# Patient Record
Sex: Female | Born: 1970 | Race: Black or African American | Hispanic: No | Marital: Married | Smoking: Never smoker
Health system: Southern US, Community
[De-identification: ages and names within clinical notes are randomized; demographics above are authoritative.]

## PROBLEM LIST (undated history)

## (undated) DIAGNOSIS — M5430 Sciatica, unspecified side: Secondary | ICD-10-CM

## (undated) DIAGNOSIS — IMO0002 Reserved for concepts with insufficient information to code with codable children: Secondary | ICD-10-CM

## (undated) DIAGNOSIS — I10 Essential (primary) hypertension: Secondary | ICD-10-CM

## (undated) DIAGNOSIS — R7989 Other specified abnormal findings of blood chemistry: Secondary | ICD-10-CM

## (undated) DIAGNOSIS — M2550 Pain in unspecified joint: Secondary | ICD-10-CM

## (undated) DIAGNOSIS — R112 Nausea with vomiting, unspecified: Secondary | ICD-10-CM

## (undated) DIAGNOSIS — D649 Anemia, unspecified: Secondary | ICD-10-CM

## (undated) DIAGNOSIS — M1990 Unspecified osteoarthritis, unspecified site: Secondary | ICD-10-CM

## (undated) DIAGNOSIS — R232 Flushing: Secondary | ICD-10-CM

## (undated) DIAGNOSIS — H539 Unspecified visual disturbance: Secondary | ICD-10-CM

## (undated) DIAGNOSIS — J302 Other seasonal allergic rhinitis: Secondary | ICD-10-CM

## (undated) DIAGNOSIS — M545 Low back pain, unspecified: Secondary | ICD-10-CM

## (undated) HISTORY — DX: Reserved for concepts with insufficient information to code with codable children: IMO0002

## (undated) HISTORY — PX: OTHER SURGICAL HISTORY: SHX169

## (undated) HISTORY — DX: Other specified abnormal findings of blood chemistry: R79.89

## (undated) HISTORY — PX: LAPAROSCOPY, DIAGNOSTIC: SHX4584

## (undated) HISTORY — DX: Low back pain, unspecified: M54.50

## (undated) HISTORY — PX: ENDOMETRIAL ABLATION: SHX621

## (undated) HISTORY — DX: Unspecified osteoarthritis, unspecified site: M19.90

## (undated) HISTORY — DX: Sciatica, unspecified side: M54.30

## (undated) HISTORY — DX: Other seasonal allergic rhinitis: J30.2

## (undated) HISTORY — DX: Flushing: R23.2

## (undated) HISTORY — DX: Pain in unspecified joint: M25.50

## (undated) HISTORY — PX: EXCISION BIOPSY WITH NEEDLE LOCALIZATION: SHX2709

## (undated) HISTORY — DX: Essential (primary) hypertension: I10

---

## 2007-08-03 ENCOUNTER — Other Ambulatory Visit: Admission: RE | Admit: 2007-08-03 | Discharge: 2007-08-03 | Payer: Self-pay | Admitting: Family Medicine

## 2007-09-21 ENCOUNTER — Encounter: Admission: RE | Admit: 2007-09-21 | Discharge: 2007-09-21 | Payer: Self-pay | Admitting: Family Medicine

## 2008-07-29 ENCOUNTER — Inpatient Hospital Stay (HOSPITAL_COMMUNITY): Admission: EM | Admit: 2008-07-29 | Discharge: 2008-08-02 | Payer: Self-pay | Admitting: Emergency Medicine

## 2008-12-12 ENCOUNTER — Encounter: Admission: RE | Admit: 2008-12-12 | Discharge: 2008-12-12 | Payer: Self-pay | Admitting: Obstetrics and Gynecology

## 2009-01-12 ENCOUNTER — Encounter: Admission: RE | Admit: 2009-01-12 | Discharge: 2009-01-12 | Payer: Self-pay | Admitting: Obstetrics and Gynecology

## 2010-01-14 ENCOUNTER — Encounter: Admission: RE | Admit: 2010-01-14 | Discharge: 2010-01-14 | Payer: Self-pay | Admitting: Internal Medicine

## 2010-04-20 ENCOUNTER — Encounter: Admission: RE | Admit: 2010-04-20 | Discharge: 2010-04-20 | Payer: Self-pay | Admitting: Neurosurgery

## 2010-08-18 ENCOUNTER — Encounter: Payer: Self-pay | Admitting: Family Medicine

## 2010-11-11 LAB — POCT I-STAT, CHEM 8
Chloride: 103 mEq/L (ref 96–112)
Glucose, Bld: 344 mg/dL — ABNORMAL HIGH (ref 70–99)
HCT: 42 % (ref 36.0–46.0)

## 2010-11-11 LAB — DIFFERENTIAL
Eosinophils Relative: 1 % (ref 0–5)
Lymphocytes Relative: 10 % — ABNORMAL LOW (ref 12–46)
Lymphocytes Relative: 22 % (ref 12–46)
Lymphocytes Relative: 5 % — ABNORMAL LOW (ref 12–46)
Lymphs Abs: 1.6 10*3/uL (ref 0.7–4.0)
Lymphs Abs: 1.6 10*3/uL (ref 0.7–4.0)
Lymphs Abs: 1.8 10*3/uL (ref 0.7–4.0)
Monocytes Absolute: 0.7 10*3/uL (ref 0.1–1.0)
Monocytes Absolute: 0.8 10*3/uL (ref 0.1–1.0)
Monocytes Absolute: 1.6 10*3/uL — ABNORMAL HIGH (ref 0.1–1.0)
Monocytes Relative: 4 % (ref 3–12)
Monocytes Relative: 7 % (ref 3–12)
Monocytes Relative: 7 % (ref 3–12)
Neutro Abs: 29.6 10*3/uL — ABNORMAL HIGH (ref 1.7–7.7)
Neutro Abs: 6.2 10*3/uL (ref 1.7–7.7)
Neutro Abs: 7.6 10*3/uL (ref 1.7–7.7)
Neutrophils Relative %: 71 % (ref 43–77)
Neutrophils Relative %: 90 % — ABNORMAL HIGH (ref 43–77)

## 2010-11-11 LAB — CBC
HCT: 31.4 % — ABNORMAL LOW (ref 36.0–46.0)
HCT: 40.4 % (ref 36.0–46.0)
Hemoglobin: 10 g/dL — ABNORMAL LOW (ref 12.0–15.0)
Hemoglobin: 9.3 g/dL — ABNORMAL LOW (ref 12.0–15.0)
MCHC: 31.9 g/dL (ref 30.0–36.0)
MCV: 81.9 fL (ref 78.0–100.0)
Platelets: 264 10*3/uL (ref 150–400)
RBC: 3.3 MIL/uL — ABNORMAL LOW (ref 3.87–5.11)
RBC: 3.52 MIL/uL — ABNORMAL LOW (ref 3.87–5.11)
RBC: 3.83 MIL/uL — ABNORMAL LOW (ref 3.87–5.11)
RBC: 4.93 MIL/uL (ref 3.87–5.11)
RDW: 15.8 % — ABNORMAL HIGH (ref 11.5–15.5)
WBC: 17 10*3/uL — ABNORMAL HIGH (ref 4.0–10.5)
WBC: 32.8 10*3/uL — ABNORMAL HIGH (ref 4.0–10.5)
WBC: 8.7 10*3/uL (ref 4.0–10.5)

## 2010-11-11 LAB — POCT CARDIAC MARKERS
CKMB, poc: <1 ng/mL — ABNORMAL LOW (ref 1.0–8.0)
Myoglobin, poc: 53.4 ng/mL (ref 12–200)
Troponin i, poc: <0.05 ng/mL (ref 0.00–0.09)

## 2010-11-11 LAB — URINALYSIS, ROUTINE W REFLEX MICROSCOPIC
Glucose, UA: >1000 mg/dL — AB
Hgb urine dipstick: NEGATIVE
Ketones, ur: NEGATIVE mg/dL
Leukocytes, UA: NEGATIVE
Protein, ur: 30 mg/dL — AB
Specific Gravity, Urine: 1.027 (ref 1.005–1.030)
Urobilinogen, UA: 1 mg/dL (ref 0.0–1.0)

## 2010-11-11 LAB — BASIC METABOLIC PANEL
CO2: 25 mEq/L (ref 19–32)
Calcium: 7.8 mg/dL — ABNORMAL LOW (ref 8.4–10.5)
Chloride: 100 mEq/L (ref 96–112)
GFR calc Af Amer: >60 mL/min (ref >60)
GFR calc non Af Amer: >60 mL/min (ref >60)
GFR calc non Af Amer: >60 mL/min (ref >60)
Potassium: 3.8 mEq/L (ref 3.5–5.1)
Potassium: 3.9 mEq/L (ref 3.5–5.1)
Sodium: 131 mEq/L — ABNORMAL LOW (ref 135–145)
Sodium: 138 mEq/L (ref 135–145)

## 2010-11-11 LAB — GLUCOSE, CAPILLARY
Glucose-Capillary: 174 mg/dL — ABNORMAL HIGH (ref 70–99)
Glucose-Capillary: 174 mg/dL — ABNORMAL HIGH (ref 70–99)
Glucose-Capillary: 186 mg/dL — ABNORMAL HIGH (ref 70–99)
Glucose-Capillary: 194 mg/dL — ABNORMAL HIGH (ref 70–99)
Glucose-Capillary: 208 mg/dL — ABNORMAL HIGH (ref 70–99)
Glucose-Capillary: 214 mg/dL — ABNORMAL HIGH (ref 70–99)
Glucose-Capillary: 218 mg/dL — ABNORMAL HIGH (ref 70–99)
Glucose-Capillary: 226 mg/dL — ABNORMAL HIGH (ref 70–99)
Glucose-Capillary: 240 mg/dL — ABNORMAL HIGH (ref 70–99)
Glucose-Capillary: 270 mg/dL — ABNORMAL HIGH (ref 70–99)
Glucose-Capillary: 278 mg/dL — ABNORMAL HIGH (ref 70–99)

## 2010-11-11 LAB — CULTURE, BLOOD (ROUTINE X 2): Culture: NO GROWTH

## 2010-11-11 LAB — COMPREHENSIVE METABOLIC PANEL
AST: 14 U/L (ref 0–37)
Albumin: 3.2 g/dL — ABNORMAL LOW (ref 3.5–5.2)
Alkaline Phosphatase: 80 U/L (ref 39–117)
CO2: 26 mEq/L (ref 19–32)
Glucose, Bld: 233 mg/dL — ABNORMAL HIGH (ref 70–99)
Potassium: 3.9 mEq/L (ref 3.5–5.1)

## 2010-11-11 LAB — CHOLESTEROL, TOTAL: Cholesterol: 122 mg/dL (ref 0–200)

## 2010-11-11 LAB — HEMOGLOBIN A1C: Hgb A1c MFr Bld: 9.7 % — ABNORMAL HIGH (ref 4.6–6.1)

## 2010-11-11 LAB — TSH: TSH: 1.125 u[IU]/mL (ref 0.350–4.500)

## 2010-11-11 LAB — URINE CULTURE: Special Requests: NEGATIVE

## 2010-12-06 ENCOUNTER — Emergency Department: Admit: 2010-12-06 | Disposition: A | Discharge status: Home / Self Care | Payer: Self-pay | Source: Emergency Department

## 2010-12-06 LAB — URINALYSIS, REFLEX TO MICROSCOPIC EXAM IF INDICATED
Bilirubin, UA: NEGATIVE
Blood, UA: NEGATIVE
Glucose, UA: NEGATIVE
Ketones UA: NEGATIVE
Leukocyte Esterase, UA: NEGATIVE
Nitrite, UA: NEGATIVE
Protein, UR: NEGATIVE
Specific Gravity UA POCT: 1.01 (ref 1.001–1.035)
Urine pH: 7.5 (ref 5.0–8.0)
Urobilinogen, UA: 0.2 mg/dL

## 2010-12-06 LAB — URINE HCG QUALITATIVE: Urine HCG Qualitative: NEGATIVE

## 2010-12-10 NOTE — Discharge Summary (Signed)
[name], [name] NO.:  [id number]   MEDICAL RECORD NO.:  [id number]          PATIENT TYPE:  INP   LOCATION:  5508                         FACILITY:  [facility]   PHYSICIAN:  Altha Harm, MDDATE OF BIRTH:  [birth year]   DATE OF ADMISSION:  07/29/2008  DATE OF DISCHARGE:  08/02/2008                               DISCHARGE SUMMARY   DISCHARGE DISPOSITION:  Home.   FINAL DISCHARGE DIAGNOSES:  1. Community-acquired pneumonia.  2. Sepsis, resolved.  3. Diabetes type 2, uncontrolled.  4. Hypertension.  5. Hyperlipidemia.  6. Iron-deficiency anemia.  7. Gastroesophageal reflux disease.   DISCHARGE MEDICATIONS:  1. Metformin 850 mg p.o. b.i.d.  2. Avelox 4 mg p.o. daily times 5 days.  3. Lisinopril 10 mg p.o. daily.  4. Pravastatin 10 mg p.o. daily.  5. Iron 325 mg p.o. b.i.d.  6. Prilosec 20 mg p.o. daily.  7. Multivitamin 1 tablet p.o. daily.  8. Vitamin C 1000 mg p.o. b.i.d.  9. Fish oil 4 tablets p.o. daily.   CONSULTANTS:  None.   PROCEDURES:  None.   DIAGNOSTIC STUDIES:  1. Chest x-ray, two-view done on admission which shows findings      consistent with atelectasis bibasilar.  2. CT abdomen with contrast done on January 2 which showed right      middle and lower lobe air space disease consistent with infection,      probable gallstones without acute cholecystitis.   PERTINENT LABORATORY STUDIES:  Hemoglobin A1c elevated at 9.7.   CHIEF COMPLAINT:  Shortness of breath.   HISTORY OF PRESENT ILLNESS:  Please refer to the H and P dictated by  Della Goo, M.D. for details of the HPI.   HOSPITAL COURSE:  The patient was admitted and initially started  azithromycin and Rocephin.  The patient continued to deteriorate and  became septic.  Her antibiotics were changed to Zosyn and vancomycin and  she was given aggressive fluid resuscitation.  The patient over her  hospital course improved and resolved her hypoxia.  The patient's  antibiotics were changed from vancomycin and Zosyn to Avelox 400 mg p.o.  daily and the patient will complete an additional 5 days after discharge  for a total of 10 days of antibiotics.   Diabetes type 2.  The patient was found to have a hemoglobin A1c of 9.5.  The patient's blood sugars were elevated during the hospitalization and  she was taken off of her metformin in light of her contrast study of a  CT.  The patient, prehospital was on the lowest dose of metformin and  certainly has significant room for further oral medications.  The  patient's metformin is being increased to 850 mg p.o. b.i.d. and she  will follow up with her primary care physician Della Goo, M.D.  for further titration of her medications.  The patient at this time does  not want to have to take insulin and I agreed that she can likely get  her blood sugars under better control without the use of insulin at this  time.  Otherwise, the patient has remained stable.  Her other medical  problems were noted and are stable.   FOLLOW UP:  The patient is to follow up with her primary care physician  Della Goo, M.D. in 3-5 days.   DIETARY RESTRICTIONS:  The patient should be on a diabetic low-  cholesterol diet.   PHYSICAL RESTRICTIONS:  Activity as tolerated.      Altha Harm, MD  Electronically Signed     MAM/MEDQ  D:  08/01/2008  T:  08/01/2008  Job:  [id number]   cc:   Della Goo, M.D.

## 2010-12-10 NOTE — H&P (Signed)
NAMEGROVER, WOODFIELD NO.:  [id number]   MEDICAL RECORD NO.:  [id number]          PATIENT TYPE:  INP   LOCATION:  5025                         FACILITY:  [facility]   PHYSICIAN:  Della Goo, M.D. DATE OF BIRTH:  [birth year]   DATE OF ADMISSION:  07/29/2008  DATE OF DISCHARGE:                              HISTORY & PHYSICAL   PRIMARY CARE PHYSICIAN:  None.   CHIEF COMPLAINT:  Shortness of breath.   HISTORY OF PRESENT ILLNESS:  This is a 40 year old female who presents  to the emergency department with complaints of worsening shortness of  breath and back pain along with cough over the past 2-3 days.  She  reports having back pain associated with her coughing and reports having  pain which she takes deep breaths.  She denies having fevers and chills.  However, was found to have a temperature of 101.0 in the emergency  department.  She denies having any myalgias or arthralgias or night  sweats.  She also denies having any nausea, vomiting, diarrhea.   PAST MEDICAL HISTORY:  Significant for:  1. Type 2 diabetes mellitus.  2. Hyperlipidemia.  3. Hypertension.   PAST SURGICAL HISTORY:  History of a C-section x1.   Her medications include:  1. Metformin 500 mg 1 p.o. b.i.d.  2. Lisinopril 10 mg 1 p.o. daily.  3. Pravastatin 10 mg 1 p.o. daily   ALLERGIES:  No known drug allergies.   SOCIAL HISTORY:  The patient is a nonsmoker, nondrinker.   FAMILY HISTORY:  Positive for diabetic and hypertensive disease.   REVIEW OF SYSTEMS:  Mentioned above in the history of present illness.   PHYSICAL EXAMINATION FINDINGS:  This is a 40 year old, obese, well-  developed, female in no discomfort or acute distress.  VITAL SIGNS:  Temperature 101.0, blood pressure 131/86, heart rate 143  initially, respirations 26, O2 saturations 90% initially - now 96%.  HEENT EXAMINATION:  Normocephalic, atraumatic.  Pupils equally round,  reactive to light.  Extraocular movements are  intact.  Funduscopic  benign.  There is no scleral icterus.  Conjunctivae are pink.  There is  no conjunctival injection.  Nares are patent.  Oropharynx is clear.  No  exudates or erythema.  NECK:  Is supple full range of motion.  No thyromegaly, adenopathy, or  jugular venous distention.  CARDIOVASCULAR:  Regular rate and rhythm.  No murmurs, gallops or rubs.  LUNGS:  With occasional rhonchi.  No rales, no wheezes.  ABDOMEN:  Positive bowel sounds, soft, nontender, nondistended.  EXTREMITIES:  Without cyanosis, clubbing or edema.  NEUROLOGIC EXAMINATION:  Nonfocal.   LABORATORY STUDIES:  White blood cell count 32.8, hemoglobin 12.8,  hematocrit 40.4, platelets 164, neutrophils 90%, lymphocytes 5%, MCV  81.9.  Sodium 133, potassium 4.4, chloride 103, bicarb 21, BUN 7,  creatinine 0.8, and glucose 344.  D-dimer less than 0.22.  Point of care  cardiac markers with a myoglobin of 53.4, CK-MB less than 1.0, troponin  less than 0.05.  Chest x-ray reveals decreased lung volumes with  bibasilar atelectasis.   ASSESSMENT:  A 40 year old female being admitted  with:  1. Sepsis.  2. Pneumonia.  3. Type 2 diabetes mellitus with hyperglycemia.  4. Hypertension.  5. Shortness of breath.  6. Mild hyponatremia.   PLAN:  The patient will be admitted and placed on antibiotic therapy of  Rocephin and azithromycin.  Nebulizer treatments have been ordered along  with fluid resuscitation.  The patient will also be placed on empiric  influenza medication and respiratory droplet precautions.  Medications  have been ordered for coughing and congestion symptoms as well.  The  patient's electrolytes will be corrected as needed.  DVT and GI  prophylaxis have been ordered.  Sliding scale insulin coverage has also  been ordered p.r.n. elevated blood sugars.  Further workup will ensue  pending results of the patient's clinical course and results of her  studies.      Della Goo, M.D.   Electronically Signed     HJ/MEDQ  D:  07/29/2008  T:  07/29/2008  Job:  [id number]

## 2011-01-16 ENCOUNTER — Other Ambulatory Visit: Payer: Self-pay | Admitting: Internal Medicine

## 2011-01-16 DIAGNOSIS — Z1231 Encounter for screening mammogram for malignant neoplasm of breast: Secondary | ICD-10-CM

## 2011-01-23 ENCOUNTER — Ambulatory Visit
Admission: RE | Admit: 2011-01-23 | Discharge: 2011-01-23 | Disposition: A | Discharge status: Home / Self Care | Payer: Medicaid Other | Source: Ambulatory Visit | Attending: Internal Medicine | Admitting: Internal Medicine

## 2011-01-23 DIAGNOSIS — Z1231 Encounter for screening mammogram for malignant neoplasm of breast: Secondary | ICD-10-CM

## 2011-02-11 ENCOUNTER — Other Ambulatory Visit (HOSPITAL_COMMUNITY)
Admission: RE | Admit: 2011-02-11 | Discharge: 2011-02-11 | Disposition: A | Discharge status: Home / Self Care | Payer: Medicaid Other | Source: Ambulatory Visit | Attending: Obstetrics and Gynecology | Admitting: Obstetrics and Gynecology

## 2011-02-11 ENCOUNTER — Other Ambulatory Visit: Payer: Self-pay

## 2011-02-11 DIAGNOSIS — Z01419 Encounter for gynecological examination (general) (routine) without abnormal findings: Secondary | ICD-10-CM | POA: Insufficient documentation

## 2011-02-11 DIAGNOSIS — Z1159 Encounter for screening for other viral diseases: Secondary | ICD-10-CM | POA: Insufficient documentation

## 2011-08-04 ENCOUNTER — Emergency Department (INDEPENDENT_AMBULATORY_CARE_PROVIDER_SITE_OTHER)
Admission: EM | Admit: 2011-08-04 | Discharge: 2011-08-04 | Disposition: A | Discharge status: Home / Self Care | Payer: Self-pay | Source: Home / Self Care | Attending: Family Medicine | Admitting: Family Medicine

## 2011-08-04 ENCOUNTER — Encounter (HOSPITAL_COMMUNITY): Payer: Self-pay | Admitting: *Deleted

## 2011-08-04 DIAGNOSIS — K5289 Other specified noninfective gastroenteritis and colitis: Secondary | ICD-10-CM

## 2011-08-04 DIAGNOSIS — E119 Type 2 diabetes mellitus without complications: Secondary | ICD-10-CM

## 2011-08-04 DIAGNOSIS — K529 Noninfective gastroenteritis and colitis, unspecified: Secondary | ICD-10-CM

## 2011-08-04 HISTORY — DX: Sciatica, unspecified side: M54.30

## 2011-08-04 HISTORY — DX: Essential (primary) hypertension: I10

## 2011-08-04 LAB — POCT I-STAT, CHEM 8
BUN: 15 mg/dL (ref 6–23)
Calcium, Ion: 1.12 mmol/L (ref 1.12–1.32)
Chloride: 103 mEq/L (ref 96–112)
Creatinine, Ser: 0.8 mg/dL (ref 0.50–1.10)
Glucose, Bld: 237 mg/dL — ABNORMAL HIGH (ref 70–99)
HCT: 41 % (ref 36.0–46.0)
Hemoglobin: 13.9 g/dL (ref 12.0–15.0)
Potassium: 4.1 mEq/L (ref 3.5–5.1)
Sodium: 133 mEq/L — ABNORMAL LOW (ref 135–145)
TCO2: 22 mmol/L (ref 0–100)

## 2011-08-04 LAB — GLUCOSE, CAPILLARY: Glucose-Capillary: 227 mg/dL — ABNORMAL HIGH (ref 70–99)

## 2011-08-04 MED ORDER — ONDANSETRON HCL 4 MG/2ML IJ SOLN
4.0000 mg | Freq: Once | INTRAMUSCULAR | Status: AC
  Administered 2011-08-04: 4 mg via INTRAMUSCULAR

## 2011-08-04 MED ORDER — ACETAMINOPHEN 325 MG PO TABS
ORAL_TABLET | ORAL | Status: AC
  Filled 2011-08-04: qty 2

## 2011-08-04 MED ORDER — ONDANSETRON HCL 4 MG/2ML IJ SOLN
INTRAMUSCULAR | Status: AC
  Filled 2011-08-04: qty 2

## 2011-08-04 MED ORDER — ONDANSETRON HCL 4 MG PO TABS: 4.0000 mg | ORAL_TABLET | Freq: Four times a day (QID) | ORAL | Status: AC

## 2011-08-04 NOTE — ED Provider Notes (Signed)
History     CSN: [id number]  Arrival date & time 08/04/11  1509   First MD Initiated Contact with Patient 08/04/11 1623      Chief Complaint  Patient presents with  . Emesis    (Consider location/radiation/quality/duration/timing/severity/associated sxs/prior treatment) Patient is a 41 y.o. female presenting with vomiting. The history is provided by the patient.  Emesis  This is a new problem. The current episode started yesterday. The problem occurs 2 to 4 times per day. The problem has not changed since onset.The emesis has an appearance of stomach contents. The maximum temperature recorded prior to her arrival was 101 to 101.9 F. The fever has been present for less than 1 day. Associated symptoms include diarrhea and a fever.    Past Medical History  Diagnosis Date  . Diabetes mellitus   . Sciatica   . Hypertension     Past Surgical History  Procedure Date  . Cesarean section     History reviewed. No pertinent family history.  History  Substance Use Topics  . Smoking status: Current Everyday Smoker  . Smokeless tobacco: Not on file  . Alcohol Use: No    OB History    Grav Para Term Preterm Abortions TAB SAB Ect Mult Living                  Review of Systems  Constitutional: Positive for fever.  HENT: Negative.   Eyes: Negative.   Respiratory: Negative.   Cardiovascular: Negative.   Gastrointestinal: Positive for nausea, vomiting and diarrhea. Negative for blood in stool.  Skin: Negative.     Allergies  Review of patient's allergies indicates no known allergies.  Home Medications   Current Outpatient Rx  Name Route Sig Dispense Refill  . GABAPENTIN 300 MG PO CAPS Oral Take 300 mg by mouth 3 (three) times daily.      Marland Kitchen HYDROCODONE-ACETAMINOPHEN 10-650 MG PO TABS Oral Take 1 tablet by mouth every 6 (six) hours as needed.      . INSULIN GLARGINE 100 UNIT/ML Coker SOLN Subcutaneous Inject 25 Units into the skin at bedtime.      Marland Kitchen LISINOPRIL 10 MG PO TABS  Oral Take 10 mg by mouth daily.      Marland Kitchen METFORMIN HCL 500 MG PO TABS Oral Take 500 mg by mouth 2 (two) times daily with a meal.      . PRAVASTATIN SODIUM 10 MG PO TABS Oral Take 10 mg by mouth daily.      Marland Kitchen ONDANSETRON HCL 4 MG PO TABS Oral Take 1 tablet (4 mg total) by mouth every 6 (six) hours. 8 tablet 0    BP 137/78  Pulse 160  Temp(Src) 102.5 F (39.2 C) (Oral)  Resp 18  SpO2 96%  LMP 07/17/2011  Physical Exam  Nursing note and vitals reviewed. Constitutional: She is oriented to person, place, and time. She appears well-developed and well-nourished.  HENT:  Head: Normocephalic.  Mouth/Throat: Oropharynx is clear and moist.  Neck: Normal range of motion. Neck supple.  Cardiovascular: Regular rhythm, normal heart sounds and intact distal pulses.  Tachycardia present.   Pulmonary/Chest: Effort normal and breath sounds normal.  Abdominal: Soft. Bowel sounds are normal. She exhibits no distension. There is no tenderness. There is no rebound and no guarding.  Lymphadenopathy:    She has no cervical adenopathy.  Neurological: She is alert and oriented to person, place, and time.  Skin: Skin is warm and dry.  Psychiatric: She has a normal  mood and affect.    ED Course  Procedures (including critical care time)  Labs Reviewed  GLUCOSE, CAPILLARY - Abnormal; Notable for the following:    Glucose-Capillary 227 (*)    All other components within normal limits  POCT I-STAT, CHEM 8 - Abnormal; Notable for the following:    Sodium 133 (*)    Glucose, Bld 237 (*)    All other components within normal limits  I-STAT, CHEM 8   No results found.   1. Gastroenteritis   2. Diabetes mellitus       MDM          Barkley Bruns, MD 08/04/11 [phone]

## 2011-08-04 NOTE — ED Notes (Signed)
Pt  Has  vomitited  At  Least  12  Times   Over  Last  2  Days          Has  Had  3  Loose  Stools

## 2011-08-04 NOTE — ED Notes (Signed)
Pt  Is   Diabetic  She  Has  Fever  And  Vomiting  soince  yest  Did  Not  Take  Any  meds    Today  Went  To  Work  And  Had  To  Leave   She  Also  Reports   Pain  r  Leg

## 2012-01-12 ENCOUNTER — Other Ambulatory Visit: Payer: Self-pay | Admitting: Internal Medicine

## 2012-01-12 DIAGNOSIS — Z1231 Encounter for screening mammogram for malignant neoplasm of breast: Secondary | ICD-10-CM

## 2012-03-02 ENCOUNTER — Ambulatory Visit: Payer: Self-pay

## 2012-12-03 ENCOUNTER — Other Ambulatory Visit: Payer: Self-pay | Admitting: Internal Medicine

## 2012-12-03 ENCOUNTER — Ambulatory Visit
Admission: RE | Admit: 2012-12-03 | Discharge: 2012-12-03 | Disposition: A | Discharge status: Home / Self Care | Payer: BC Managed Care – PPO | Source: Ambulatory Visit | Attending: Internal Medicine | Admitting: Internal Medicine

## 2012-12-03 DIAGNOSIS — R52 Pain, unspecified: Secondary | ICD-10-CM

## 2012-12-03 DIAGNOSIS — M549 Dorsalgia, unspecified: Secondary | ICD-10-CM

## 2013-01-13 ENCOUNTER — Other Ambulatory Visit: Payer: Self-pay | Admitting: Neurosurgery

## 2013-01-13 DIAGNOSIS — M47816 Spondylosis without myelopathy or radiculopathy, lumbar region: Secondary | ICD-10-CM

## 2013-01-18 ENCOUNTER — Ambulatory Visit
Admission: RE | Admit: 2013-01-18 | Discharge: 2013-01-18 | Disposition: A | Discharge status: Home / Self Care | Payer: BC Managed Care – PPO | Source: Ambulatory Visit | Attending: Neurosurgery | Admitting: Neurosurgery

## 2013-01-18 DIAGNOSIS — M47816 Spondylosis without myelopathy or radiculopathy, lumbar region: Secondary | ICD-10-CM

## 2013-11-21 ENCOUNTER — Ambulatory Visit: Payer: BC Managed Care – PPO | Admitting: Endocrinology

## 2013-11-24 ENCOUNTER — Ambulatory Visit (INDEPENDENT_AMBULATORY_CARE_PROVIDER_SITE_OTHER): Payer: BC Managed Care – PPO | Admitting: Endocrinology

## 2013-11-24 ENCOUNTER — Encounter: Payer: Self-pay | Admitting: Endocrinology

## 2013-11-24 VITALS — BP 126/76 | HR 108 | Temp 98.1°F | Resp 12 | Ht 64.0 in | Wt 194.0 lb

## 2013-11-24 DIAGNOSIS — I10 Essential (primary) hypertension: Secondary | ICD-10-CM

## 2013-11-24 DIAGNOSIS — E1165 Type 2 diabetes mellitus with hyperglycemia: Principal | ICD-10-CM

## 2013-11-24 DIAGNOSIS — IMO0001 Reserved for inherently not codable concepts without codable children: Secondary | ICD-10-CM

## 2013-11-24 DIAGNOSIS — Z794 Long term (current) use of insulin: Secondary | ICD-10-CM | POA: Insufficient documentation

## 2013-11-24 DIAGNOSIS — E119 Type 2 diabetes mellitus without complications: Secondary | ICD-10-CM

## 2013-11-24 DIAGNOSIS — E11319 Type 2 diabetes mellitus with unspecified diabetic retinopathy without macular edema: Secondary | ICD-10-CM | POA: Insufficient documentation

## 2013-11-24 DIAGNOSIS — E7800 Pure hypercholesterolemia, unspecified: Secondary | ICD-10-CM

## 2013-11-24 LAB — GLUCOSE, POCT (MANUAL RESULT ENTRY): POC Glucose: 294 mg/dl — AB (ref 70–99)

## 2013-11-24 MED ORDER — METFORMIN HCL 500 MG PO TABS: 1000.0000 mg | ORAL_TABLET | Freq: Two times a day (BID) | ORAL | Status: DC

## 2013-11-24 MED ORDER — CANAGLIFLOZIN 100 MG PO TABS: 100.0000 mg | ORAL_TABLET | Freq: Every day | ORAL | Status: DC

## 2013-11-24 MED ORDER — VICTOZA 18 MG/3ML ~~LOC~~ SOPN
1.2000 mg | PEN_INJECTOR | Freq: Every day | SUBCUTANEOUS | Status: DC
Start: 2013-11-24 — End: 2014-04-27

## 2013-11-24 NOTE — Progress Notes (Signed)
Patient ID: [name] Hurley, female   DOB: 1971-07-22, 43 y.o.   MRN: [id number]    Reason for Appointment: Consultation for Type 2 Diabetes  History of Present Illness:          Diagnosis: Type 2 diabetes mellitus, date of diagnosis: 2000        Past history: She had symptoms of hyperglycemia or diagnoses and a glucose of 302.  She was initially treated with insulin doses tid for 2 yrs Subsequently she was treated with Glucophage and Avandamet In 2003 because of gestational diabetes and hyperglycemia she was treated with premixed insulin Afterwards she was continued on insulin and metformin; she thinks she has been taking Lantus for about 5 years Apparently her diabetes control has been very poor consistently but no records are available. She thinks her A1c was 10% in 2012 and also was high and 2010  Recent history:  She has been followed periodically by a primary care physician but no recent A1c has been done She thinks she has been on 25 units of Lantus without any mealtime insulin for quite some time Also taking low dose metformin She is checking her blood sugar only when she does not feel well Does not think she has excessive thirst or frequent urination, no excessive fatigue or weight loss recently. She is now interested in taking better care of herself and is here for consultation       Oral hypoglycemic drugs the patient is taking are: Metformin 500 mg twice a day      Side effects from medications have been: None INSULIN regimen is described as: 25 units hs   Glucose monitoring:  prn        Glucometer: One Touch.      Blood Glucose readings from recall: Fasting about 190 and bedtime about 290 Hypoglycemia: None      Glycemic control: A1c was 10 in 2012  Lab Results  Component Value Date   HGBA1C  Value: 9.7 (NOTE)   The ADA recommends the following therapeutic goal for glycemic   control related to Hgb A1C measurement:   Goal of Therapy:   < 7.0% Hgb A1C   Reference:  American Diabetes Association: Clinical Practice   Recommendations 2008, Diabetes Care,  2008, 31:(Suppl 1).* 07/29/2008   Lab Results  Component Value Date   CREATININE 0.80 08/04/2011    Self-care: The diet that the patient has been following is low fat and avoiding drinks with sugar     Meals: 2-3 meals per day.          Exercise:  trying to walk 3-4 times a week         Dietician visit: Most recent: At diagnosis and 2001.               Compliance with the medical regimen: fair Retinal exam: Most recent: 9/14, no known retinopathy     Weight history: 180-220 pounds, highest after pregnancy  Wt Readings from Last 3 Encounters:  11/24/13 194 lb (87.998 kg)      Medication List       This list is accurate as of: 11/24/13  4:15 PM.  Always use your most recent med list.               gabapentin 300 MG capsule  Commonly known as:  NEURONTIN  Take 300 mg by mouth 3 (three) times daily.     HYDROcodone-acetaminophen 10-650 MG per tablet  Commonly known as:  LORCET  Take 1 tablet by mouth every 6 (six) hours as needed.     insulin glargine 100 UNIT/ML injection  Commonly known as:  LANTUS  Inject 25 Units into the skin at bedtime.     lisinopril 10 MG tablet  Commonly known as:  PRINIVIL,ZESTRIL  Take 10 mg by mouth daily.     metFORMIN 500 MG tablet  Commonly known as:  GLUCOPHAGE  Take 500 mg by mouth 2 (two) times daily with a meal.     pravastatin 10 MG tablet  Commonly known as:  PRAVACHOL  Take 10 mg by mouth daily.     traMADol 50 MG tablet  Commonly known as:  ULTRAM        Allergies: No Known Allergies  Past Medical History  Diagnosis Date  . Diabetes mellitus   . Sciatica   . Hypertension     Past Surgical History  Procedure Laterality Date  . Cesarean section      No family history on file.  Social History:  reports that she has been smoking.  She does not have any smokeless tobacco history on file. She reports that she does not drink  alcohol. Her drug history is not on file.    Review of Systems       Lipids: She has been on low-dose pravastatin for several years, was told this is for cardiovascular prophylaxis The recent records are available      Lab Results  Component Value Date   CHOL  Value: 122        ATP III CLASSIFICATION:  <200     mg/dL   Desirable  161-096200-239  mg/dL   Borderline High  >=045>=240    mg/dL   High        4/0/98111/12/2008       Skin: No rash or infections     Thyroid:  No  unusual fatigue no history of thyroid disease.     The blood pressure has been 8 yrs     No swelling of feet.     No shortness of breath on exertion.     Bowel habits: Normal.       No frequency of urination or nocturia       No joint  Pains. 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 Recently has had some numbness in her right big toe also taking tramadol for relief as needed and also gabapentin    She has regular menstrual cycles every 30 days for 3 days          No history of Numbness, tingling or burning in left foot. Does have some  pins needles and numbness recently in the right foot, mostly in the inner part and big toe     LABS:  Office Visit on 11/24/2013  Component Date Value Ref Range Status  . POC Glucose 11/24/2013 294* 70 - 99 mg/dl Final    Physical Examination:  BP 126/76  Pulse 108  Temp(Src) 98.1 F (36.7 C)  Resp 12  Ht 5\' 4"  (1.626 m)  Wt 194 lb (87.998 kg)  BMI 33.28 kg/m2  SpO2 97%  GENERAL:         Patient has generalized obesity. no cushingoid features   HEENT:         Eye exam shows normal external appearance. Fundus exam shows no retinopathy. Oral exam shows normal mucosa .  NECK:  General:  Neck exam shows no lymphadenopathy. Carotids are normal to palpation and no bruit heard.  Thyroid is not enlarged and no nodules felt.   LUNGS:         Chest is symmetrical. Lungs are clear to auscultation.Marland Kitchen   HEART:         Heart sounds:   S1 and S2 are normal. No murmurs or clicks heard., no S3 or S4.   ABDOMEN:   There is no distention present. Liver and spleen are not palpable. No other mass or tenderness present.  EXTREMITIES:     There is no edema. No skin lesions present.Marland Kitchen  NEUROLOGICAL:   Vibration sense is mildly reduced in toes. Ankle jerks and biceps reflexes are absent bilaterally.          Diabetic foot exam:  as in the foot exam section MUSCULOSKELETAL:       There is no enlargement or deformity of the joints. Spine is normal to inspection.Marland Kitchen   SKIN:       No rash. Mild acanthosis of the neck present. Minimal facial hirsutism     ASSESSMENT:  Diabetes type 2, uncontrolled  Patient has been treated with low dose metformin and basal insulin only for several years with apparently consistently high blood sugars No records are available to review and she is very erratic with her glucose monitoring Although she has been doing reasonably well with diet and exercise regimen she has difficulty losing weight Currently she is more motivated to improve her control   Discussed with the patient that she likely has significant insulin deficiency along with her insulin resistance but her control may be improved with significant weight loss She is a good candidate for adding GLP-1  and SGLT 2 drugs She also needs to be on maximum dose metformin  Will also need diabetes education  Complications: None at present  Although she is on Prinivil and pravastatin these are reportedly being given to her for prophylaxis  History of low back pain with radiculopathy  PLAN:   Start checking blood sugars regularly and discussed checking readings 3 times a week in the morning and otherwise after meals. She needs to make sure her the strips are not out of date  She will continue her walking program  Consultation with diabetes educator for basic diabetes education and meal planning Discussed with the patient the nature of GLP-1 drugs, the  action on various organ systems, how they benefit blood glucose control, as well as the benefit of weight loss and  increase satiety . Explained possible side effects especially nausea and vomiting; discussed safety information in package insert. Described injection technique and dosage titration of Victoza  starting with 0.6 mg once a day at the same time for the first week and then increasing to 1.2 mg if no symptoms of nausea. Patient brochure on Victoza and co-pay card given Invokana 100 mg before breakfast daily. Explained to the patient how this helps hyperglycemia, effects on glucose, weight, blood pressure and possible side effects and management. Brochure and co-pay card given Continue same dose of insulin for now Followup in 3 weeks and bring blood sugar monitor for review  She will need to have microalbumin checked in blood sugars are better controlled as well as lipids  She can hold off on lisinopril for now since she is starting Invokana  Total visit time including counseling = 50 minutes   Marquize Seib 11/24/2013, 4:15 PM

## 2013-11-24 NOTE — Patient Instructions (Addendum)
Please check blood sugars at least half the time about 2 hours after any meal and a 3 times a week on waking up. Please bring blood sugar monitor to each visit  .Invokana before breakfast daily  Start VICTOZA injection with the sample pen once daily at the same time of the day preferably at bedtime.  Dial the dose to 0.6 mg for the first week.  You may  experience nausea in the first few days which usually gets better the After 1 week increase the dose to 1.2mg  daily if no nausea.  You may inject in the stomach, thigh or arm.   You will feel fullness of the stomach with starting the medication and should try to keep portions of food small.    Increase metformin to 2 tablets twice a day  Stop lisinopril for now

## 2013-11-25 LAB — COMPREHENSIVE METABOLIC PANEL
ALBUMIN: 3.9 g/dL (ref 3.5–5.2)
ALK PHOS: 75 U/L (ref 39–117)
ALT: 15 U/L (ref 0–35)
AST: 24 U/L (ref 0–37)
BUN: 21 mg/dL (ref 6–23)
CALCIUM: 9.4 mg/dL (ref 8.4–10.5)
CHLORIDE: 103 meq/L (ref 96–112)
CO2: 22 meq/L (ref 19–32)
Creatinine, Ser: 0.9 mg/dL (ref 0.4–1.2)
GFR: 89.41 mL/min (ref >60.00)
GLUCOSE: 244 mg/dL — AB (ref 70–99)
POTASSIUM: 4.6 meq/L (ref 3.5–5.1)
SODIUM: 134 meq/L — AB (ref 135–145)
TOTAL PROTEIN: 7.2 g/dL (ref 6.0–8.3)
Total Bilirubin: 0.1 mg/dL — ABNORMAL LOW (ref 0.3–1.2)

## 2013-11-25 LAB — HEMOGLOBIN A1C: Hgb A1c MFr Bld: 11.1 % — ABNORMAL HIGH (ref 4.6–6.5)

## 2013-12-16 ENCOUNTER — Encounter: Payer: Self-pay | Admitting: Obstetrics & Gynecology

## 2013-12-20 ENCOUNTER — Ambulatory Visit: Payer: BC Managed Care – PPO

## 2013-12-23 ENCOUNTER — Ambulatory Visit: Payer: BC Managed Care – PPO | Admitting: Obstetrics & Gynecology

## 2013-12-23 ENCOUNTER — Ambulatory Visit: Payer: BC Managed Care – PPO

## 2013-12-23 ENCOUNTER — Encounter
Admission: RE | Disposition: A | Discharge status: Home / Self Care | Payer: Self-pay | Source: Ambulatory Visit | Attending: Obstetrics & Gynecology

## 2013-12-23 ENCOUNTER — Ambulatory Visit
Admission: RE | Admit: 2013-12-23 | Discharge: 2013-12-23 | Disposition: A | Discharge status: Home / Self Care | Payer: BC Managed Care – PPO | Source: Ambulatory Visit | Attending: Obstetrics & Gynecology | Admitting: Obstetrics & Gynecology

## 2013-12-23 ENCOUNTER — Ambulatory Visit: Payer: Self-pay

## 2013-12-23 DIAGNOSIS — D250 Submucous leiomyoma of uterus: Secondary | ICD-10-CM | POA: Insufficient documentation

## 2013-12-23 DIAGNOSIS — N920 Excessive and frequent menstruation with regular cycle: Secondary | ICD-10-CM | POA: Insufficient documentation

## 2013-12-23 DIAGNOSIS — D649 Anemia, unspecified: Secondary | ICD-10-CM | POA: Insufficient documentation

## 2013-12-23 DIAGNOSIS — D259 Leiomyoma of uterus, unspecified: Secondary | ICD-10-CM | POA: Diagnosis present

## 2013-12-23 DIAGNOSIS — N946 Dysmenorrhea, unspecified: Secondary | ICD-10-CM | POA: Insufficient documentation

## 2013-12-23 HISTORY — DX: Unspecified visual disturbance: H53.9

## 2013-12-23 HISTORY — PX: HYSTEROSCOPY, MYOSURE MORCELLATION: SHX4235

## 2013-12-23 HISTORY — DX: Anemia, unspecified: D64.9

## 2013-12-23 HISTORY — DX: Nausea with vomiting, unspecified: R11.2

## 2013-12-23 SURGERY — HYSTEROSCOPY, MYOSURE MORCELLATION
Anesthesia: Anesthesia General | Site: Pelvis | Wound class: Clean Contaminated

## 2013-12-23 MED ORDER — LIDOCAINE HCL 2 % IJ SOLN
INTRAMUSCULAR | Status: DC | PRN
Start: 2013-12-23 — End: 2013-12-23
  Administered 2013-12-23: 80 mg

## 2013-12-23 MED ORDER — DIPHENHYDRAMINE HCL 50 MG/ML IJ SOLN
INTRAMUSCULAR | Status: AC
Start: 2013-12-23 — End: ?
  Filled 2013-12-23: qty 1

## 2013-12-23 MED ORDER — FENTANYL CITRATE 0.05 MG/ML IJ SOLN
INTRAMUSCULAR | Status: AC
Start: 2013-12-23 — End: 2013-12-23
  Administered 2013-12-23: 25 ug via INTRAVENOUS
  Filled 2013-12-23: qty 2

## 2013-12-23 MED ORDER — SILVER NITRATE-POT NITRATE 75-25 % EX MISC
CUTANEOUS | Status: AC
Start: 2013-12-23 — End: ?
  Filled 2013-12-23: qty 8

## 2013-12-23 MED ORDER — KETOROLAC TROMETHAMINE 60 MG/2ML IM SOLN
INTRAMUSCULAR | Status: AC
Start: 2013-12-23 — End: ?
  Filled 2013-12-23: qty 2

## 2013-12-23 MED ORDER — DEXAMETHASONE SODIUM PHOSPHATE 4 MG/ML IJ SOLN (WRAP)
INTRAMUSCULAR | Status: DC | PRN
Start: 2013-12-23 — End: 2013-12-23
  Administered 2013-12-23: 10 mg via INTRAVENOUS

## 2013-12-23 MED ORDER — SILVER NITRATE-POT NITRATE 75-25 % EX MISC
CUTANEOUS | Status: DC | PRN
Start: 2013-12-23 — End: 2013-12-23
  Administered 2013-12-23: 2 via TOPICAL

## 2013-12-23 MED ORDER — FENTANYL CITRATE 0.05 MG/ML IJ SOLN
25.0000 ug | INTRAMUSCULAR | Status: AC | PRN
Start: 2013-12-23 — End: 2013-12-23
  Administered 2013-12-23 (×3): 25 ug via INTRAVENOUS

## 2013-12-23 MED ORDER — DEXAMETHASONE SODIUM PHOSPHATE 10 MG/ML IJ SOLN
INTRAMUSCULAR | Status: AC
Start: 2013-12-23 — End: ?
  Filled 2013-12-23: qty 1

## 2013-12-23 MED ORDER — DIPHENHYDRAMINE HCL 50 MG/ML IJ SOLN
INTRAMUSCULAR | Status: DC | PRN
Start: 2013-12-23 — End: 2013-12-23
  Administered 2013-12-23: 10 mg via INTRAVENOUS

## 2013-12-23 MED ORDER — ONDANSETRON HCL 4 MG/2ML IJ SOLN
INTRAMUSCULAR | Status: DC | PRN
Start: 2013-12-23 — End: 2013-12-23
  Administered 2013-12-23: 4 mg via INTRAVENOUS

## 2013-12-23 MED ORDER — ONDANSETRON HCL 4 MG/2ML IJ SOLN
INTRAMUSCULAR | Status: AC
Start: 2013-12-23 — End: ?
  Filled 2013-12-23: qty 2

## 2013-12-23 MED ORDER — HYDROMORPHONE HCL PF 1 MG/ML IJ SOLN
0.4000 mg | INTRAMUSCULAR | Status: DC | PRN
Start: 2013-12-23 — End: 2013-12-23

## 2013-12-23 MED ORDER — SEVOFLURANE IN SOLN
RESPIRATORY_TRACT | Status: AC
Start: 2013-12-23 — End: ?
  Filled 2013-12-23: qty 250

## 2013-12-23 MED ORDER — MIDAZOLAM HCL 2 MG/2ML IJ SOLN
INTRAMUSCULAR | Status: DC | PRN
Start: 2013-12-23 — End: 2013-12-23
  Administered 2013-12-23: 2 mg via INTRAVENOUS

## 2013-12-23 MED ORDER — LACTATED RINGERS IV SOLN
INTRAVENOUS | Status: DC
Start: 2013-12-23 — End: 2013-12-23

## 2013-12-23 MED ORDER — LACTATED RINGERS IV SOLN
INTRAVENOUS | Status: DC | PRN
Start: 2013-12-23 — End: 2013-12-23

## 2013-12-23 MED ORDER — ONDANSETRON HCL 4 MG/2ML IJ SOLN
4.0000 mg | Freq: Once | INTRAMUSCULAR | Status: DC | PRN
Start: 2013-12-23 — End: 2013-12-23

## 2013-12-23 MED ORDER — KETOROLAC TROMETHAMINE 30 MG/ML IJ SOLN
INTRAMUSCULAR | Status: DC | PRN
Start: 2013-12-23 — End: 2013-12-23
  Administered 2013-12-23: 30 mg via INTRAVENOUS

## 2013-12-23 MED ORDER — SODIUM CHLORIDE 0.9 % IV MBP
1.0000 g | Freq: Once | INTRAVENOUS | Status: AC
Start: 2013-12-23 — End: 2013-12-23
  Administered 2013-12-23: 1 g via INTRAVENOUS

## 2013-12-23 MED ORDER — PROMETHAZINE HCL 25 MG/ML IJ SOLN
6.2500 mg | Freq: Once | INTRAMUSCULAR | Status: DC | PRN
Start: 2013-12-23 — End: 2013-12-23

## 2013-12-23 MED ORDER — FENTANYL CITRATE 0.05 MG/ML IJ SOLN
INTRAMUSCULAR | Status: AC
Start: 2013-12-23 — End: ?
  Filled 2013-12-23: qty 2

## 2013-12-23 MED ORDER — LIDOCAINE HCL (PF) 2 % IJ SOLN
INTRAMUSCULAR | Status: AC
Start: 2013-12-23 — End: ?
  Filled 2013-12-23: qty 5

## 2013-12-23 MED ORDER — MIDAZOLAM HCL 2 MG/2ML IJ SOLN
INTRAMUSCULAR | Status: AC
Start: 2013-12-23 — End: ?
  Filled 2013-12-23: qty 2

## 2013-12-23 MED ORDER — FENTANYL CITRATE 0.05 MG/ML IJ SOLN
INTRAMUSCULAR | Status: DC | PRN
Start: 2013-12-23 — End: 2013-12-23
  Administered 2013-12-23 (×4): 25 ug via INTRAVENOUS

## 2013-12-23 MED ORDER — CEFAZOLIN SODIUM 1 G IJ SOLR
INTRAMUSCULAR | Status: AC
Start: 2013-12-23 — End: ?
  Filled 2013-12-23: qty 1000

## 2013-12-23 MED ORDER — PROPOFOL INFUSION 10 MG/ML
INTRAVENOUS | Status: DC | PRN
Start: 2013-12-23 — End: 2013-12-23
  Administered 2013-12-23: 180 mg via INTRAVENOUS

## 2013-12-23 MED ORDER — SODIUM CHLORIDE 0.9 % IR SOLN
Status: DC | PRN
Start: 2013-12-23 — End: 2013-12-23
  Administered 2013-12-23 (×2): 3000 mL
  Administered 2013-12-23: 6000 mL

## 2013-12-23 MED ORDER — PROPOFOL 10 MG/ML IV EMUL
INTRAVENOUS | Status: AC
Start: 2013-12-23 — End: ?
  Filled 2013-12-23: qty 20

## 2013-12-23 MED ORDER — OXYCODONE-ACETAMINOPHEN 5-325 MG PO TABS
1.0000 | ORAL_TABLET | Freq: Once | ORAL | Status: DC | PRN
Start: 2013-12-23 — End: 2013-12-23

## 2013-12-23 SURGICAL SUPPLY — 29 items
ABLATION MYOSURE (Ablation) ×1 IMPLANT
CATH BARDEX FOLEY 2WAY 16FR (Catheter Urine) ×1 IMPLANT
CATH URETHRAL RED RUBBER 16F (Catheter Urine) ×3 IMPLANT
DRAPE SRG CNVRT 44X40IN LF STRL FLTR (Drape) ×2 IMPLANT
DRAPE SURGICAL FILTER SCREEN FLUID CONTROL POUCH DRAINAGE PORT L44 IN (Drape) ×1 IMPLANT
DRESSING TELFA 3X8IN STERILE (Dressing) ×2 IMPLANT
ELECTRODE ELECTROSURGICAL ANGLE CUT LOOP (Cautery) IMPLANT
ELECTRODE ELECTROSURGICAL ANGLE CUT LOOP OD22 FR N.A. WHITE (Cautery) ×1 IMPLANT
ELECTRODE ESURG ANG CUT LOOP 22FR STRL (Cautery)
GLOVE SURG BIOGEL ORTHO SZ7 (Glove) ×2 IMPLANT
KIT SURG INCL NEEDLE CN (Kits) ×2 IMPLANT
PACK LITHOTOMY (Pack) ×2 IMPLANT
PAD ELECTROSRG GRND REM W CRD (Procedure Accessories) ×2 IMPLANT
PAD PREP CUFF 24X41IN W 9IN (Prep) ×2 IMPLANT
PAD SANITARY L12.25 IN X W4.25 IN HEAVY ABSORBENT MOISTURE BARRIER (Dressing) ×1 IMPLANT
PAD SNTR SLK FLF CRTY 12.25X4.25IN LF NS (Dressing) ×2 IMPLANT
SPONGE SRG VISTEC 8X4IN LF STRL 12 PLY (Sponge) ×2
SPONGE SURGICAL L8 IN X W4 IN 12 PLY (Sponge) ×1 IMPLANT
SPONGE SURGICAL L8 IN X W4 IN 12 PLY RADIOPAQUE BAND VISTEC BLUE WHITE (Sponge) ×1 IMPLANT
SYRINGE 60 ML GRADUATE TOOMEY TIP (Syringes, Needles) ×1 IMPLANT
SYRINGE 60 ML GRADUATE TOOMEY TIP MONOJECT MEDICAL STANDARD (Syringes, Needles) ×1 IMPLANT
SYRINGE MED PP 60ML STD MNJCT LF STRL (Syringes, Needles) ×1
SYRINGE MONOJECT TOOMEY 60ML (Syringes, Needles) ×1
TRAY SKIN BETANDINE PREP (Tray) ×2 IMPLANT
TUBING CONNECTING STERILE 10FT (Tubing) ×1
TUBING HYSTOSCOPIC W/PNCT NDLS (Tubing) ×2 IMPLANT
TUBING SCT PVC ARG 3/16IN 10FT LF STRL (Tubing) ×1
TUBING SUCTION ID3/16 IN L10 FT (Tubing) ×1 IMPLANT
TUBING SUCTION ID3/16 IN L10 FT NONCONDUCTIVE STRAIGHT MALE FEMALE (Tubing) ×1 IMPLANT

## 2013-12-23 NOTE — Op Note (Signed)
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

## 2013-12-23 NOTE — Discharge Instructions (Addendum)
POST OPERATIVE HYSTEROSCOPY INSTRUCTIONS    After arriving home from the hospital, take it easy for at least the remainder of the day. Have someone in the house with you for at least 24 hours after surgery.  As a result of the surgery and anesthesia, you may experience one or more of the following symptoms:     General body discomfort - including mild shoulder, abdominal and neck pain for about 24 hours.     Tylenol, or Ibuprofen usually relieves the discomfort, but if needed, a prescription for a pain reliever may  be given.     Sore Throat - Salt water gargles and anesthetic throat lozenges, such as Chloraseptic or Cepacol, will  usually relieve this symptom.     Mild Bleeding from the Vagina - usually lasts 1-2 weeks.  Use sanitary    pads - DO NOT USE TAMPONS OR DOUCHE.    Marland Kitchen  Normal activity can usually be resumed within 24-48 hours, other than the following:     No lifting more than 20 pounds for about a week.   Avoid tub baths, hot tubs, and swimming pools for 2 weeks.   No sexual activity until after the bleeding has completely stopped.    If you have not already made one, please call the office within two to three days after surgery to schedule a post-operative appointment in 2 weeks.    CALL IMMEDIATELY IF ANY OF THE FOLLOWING SYMPTOMS OCCUR:     Severe abdominal pain, with or without nausea and vomiting.   Unexplained fever greater than 100.5 degrees.   Redness, swelling, or drainage from the incision.   Heavy bleeding from the vagina - use of more than one pad per hour.    SPECIAL INSTRUCTIONS:        Drs. Sheila Oats, Doloris Hall, Nye,           Post Anesthesia Discharge Instructions    Although you may be awake and alert in the recovery room, small amounts of anesthetic remain in your system for about 24 hours.  You may feel tired and sleepy during this time.      You are advised to go directly home from the hospital.    Plan to stay at home and rest for the remainder of the day.    It is  advisable to have someone with you at home for 24 hours after surgery.    Do not operate a motor vehicle, or any mechanical or electrical equipment for the next 24 hours.      Be careful when you are walking around, you may become dizzy.  The effects of anesthesia and/or medications are still present and drowsiness may occur    Do not consume alcohol, tranquilizers, sleeping medications, or any other non prescribed medication for the remainder of the day.    Diet:  begin with liquids, progress your diet as tolerated or as directed by your surgeon.  Nausea and vomiting may occur in the next 24 hours.

## 2013-12-23 NOTE — Op Note (Signed)
Procedure Date: 12/23/2013     Patient Type: A     SURGEON: Waldon Merl MD  ASSISTANT:       PREOPERATIVE DIAGNOSES:  Menorrhagia, dysmenorrhea, uterine fibroids.     POSTOPERATIVE DIAGNOSES:    Menorrhagia, dysmenorrhea, uterine fibroids.      TITLE OF PROCEDURE:  Hysteroscopic myomectomy using MyoSure.       ANESTHESIOLOGIST:  Launa Grill, MD     ANESTHESIA:  IV general.     FINDINGS:  Uterus sounds to 13 cm, large amount of endometrial tissue, posterior  submucosal myoma.     ESTIMATED BLOOD LOSS:  Minimal.       SALINE DEFICIT:    1000 mL.       DRAINS:  None.     DESCRIPTION OF PROCEDURE:  The patient taken to the operating room, placed on table in supine  position.  She was prepped and draped in the usual manner.  Foley catheter  was placed.  The speculum was placed.  Bladder was filled using sterile  water for ultrasound.  Single-tooth tenaculum was used to grasp the  anterior lip of the cervix.  Uterus was sounded to 13 cm.  Cervix was  Dilated, as this was easily performed, ultrasound was not needed.  Bladder  was emptied. Using the MyoSure hysteroscope and saline,hysteroscopy was  performed.  Findings as noted.  MyoSure apparatus was added.  The  endometrial tissue curettings were performed using the MyoSure as was the  myomectomy.  Once this was adequate, instruments were removed.   Single-tooth tenaculum was removed.  Area of bleeding was cauterized using  silver nitrate sticks and the instrument and gauze counts were correct.   The patient tolerated procedure well, was transferred to recovery room in  satisfactory condition.           D:  12/23/2013 08:56 AM by Dr. Waldon Merl, MD [phone])  T:  12/23/2013 09:08 AM by [id number]      Everlean Cherry: [id number]) (Doc ID: [id number])

## 2013-12-23 NOTE — Anesthesia Preprocedure Evaluation (Signed)
Anesthesia Evaluation    AIRWAY    Mallampati: II    TM distance: >3 FB  Neck ROM: full  Mouth Opening:full   CARDIOVASCULAR    cardiovascular exam normal       DENTAL    no notable dental hx     PULMONARY    pulmonary exam normal     OTHER FINDINGS                      Anesthesia Plan    ASA 2     general                     Detailed anesthesia plan: general LMA            informed consent obtained

## 2013-12-23 NOTE — Anesthesia Postprocedure Evaluation (Signed)
Anesthesia Post Evaluation    Patient: Tammy Larson    Procedures performed: Procedure(s):  Hysteroscopic Myomectomy    Anesthesia type: General LMA    Patient location:PACU    Last vitals:   Filed Vitals:    12/23/13 0930   BP: 146/94   Pulse: 66   Temp:    Resp: 19   SpO2: 100%       Post pain: Patient not complaining of pain, continue current therapy      Mental Status:awake    Respiratory Function: tolerating room air    Cardiovascular: stable    Nausea/Vomiting: patient not complaining of nausea or vomiting    Hydration Status: adequate    Post assessment: no apparent anesthetic complications

## 2013-12-23 NOTE — H&P (Signed)
There has been no interval changes in the H&P, patient has been examined.

## 2013-12-23 NOTE — Transfer of Care (Signed)
Anesthesia Transfer of Care Note    Patient: Tammy Larson    Procedures performed: Procedure(s):  Hysteroscopic Myomectomy    Anesthesia type: General LMA    Patient location:Phase I PACU    Last vitals:   Filed Vitals:    12/23/13 0846   BP: 169/86   Pulse: 89   Temp: 36.1 C (97 F)   Resp: 16   SpO2: 100%       Post pain: Patient not complaining of pain, continue current therapy      Mental Status:awake    Respiratory Function: tolerating face mask    Cardiovascular: stable    Nausea/Vomiting: patient not complaining of nausea or vomiting    Hydration Status: adequate    Post assessment: no apparent anesthetic complications and no reportable events

## 2013-12-23 NOTE — OR PreOp (Signed)
No need type screen pre-op per Dr. Ledon Snare MD.

## 2013-12-26 ENCOUNTER — Encounter: Payer: Self-pay | Admitting: Obstetrics & Gynecology

## 2013-12-26 LAB — LAB USE ONLY - HISTORICAL SURGICAL PATHOLOGY

## 2014-01-13 ENCOUNTER — Other Ambulatory Visit: Payer: Self-pay | Admitting: *Deleted

## 2014-01-13 ENCOUNTER — Telehealth: Payer: Self-pay | Admitting: Endocrinology

## 2014-01-13 MED ORDER — GLUCOSE BLOOD VI STRP: ORAL_STRIP | Status: DC

## 2014-01-13 NOTE — Telephone Encounter (Signed)
Patient would like her one touch ultra mini strips called in  [facility] [place]    Thank you :)

## 2014-01-13 NOTE — Telephone Encounter (Signed)
rx sent

## 2014-01-17 ENCOUNTER — Ambulatory Visit (INDEPENDENT_AMBULATORY_CARE_PROVIDER_SITE_OTHER): Payer: BC Managed Care – PPO | Admitting: Endocrinology

## 2014-01-17 ENCOUNTER — Encounter: Payer: Self-pay | Admitting: Endocrinology

## 2014-01-17 VITALS — BP 128/76 | HR 91 | Temp 97.7°F | Resp 16 | Ht 64.0 in | Wt 183.2 lb

## 2014-01-17 DIAGNOSIS — E1165 Type 2 diabetes mellitus with hyperglycemia: Principal | ICD-10-CM

## 2014-01-17 DIAGNOSIS — IMO0001 Reserved for inherently not codable concepts without codable children: Secondary | ICD-10-CM

## 2014-01-17 NOTE — Progress Notes (Signed)
Patient ID: [name] Hurley, female   DOB: 05-15-1971, 43 y.o.   MRN: [id number]    Reason for Appointment:  for Type 2 Diabetes  History of Present Illness:          Diagnosis: Type 2 diabetes mellitus, date of diagnosis: 2000        Past history: She had symptoms of hyperglycemia or diagnoses and a glucose of 302.  She was initially treated with insulin doses tid for 2 yrs Subsequently she was treated with Glucophage and Avandamet In 2003 because of gestational diabetes and hyperglycemia she was treated with premixed insulin Afterwards she was continued on insulin and metformin; she thinks she has been taking Lantus for about 5 years Apparently her diabetes control has been very poor consistently but no records are available. She thinks her A1c was 10% in 2012 and also was high in 2010  Recent history:  She had  been on 25 units of Lantus without any mealtime insulin for quite some time but her A1c was 11.1% at her initial consultation She was started on Victoza and Invokana and her metformin dose was increased to the maximum She had baseline blood sugars mostly in the 200+ range She has tolerated the Victoza and has no side effects from Invokana or diagnoses metformin Her blood sugars have improved significantly and she is feeling less tired and had less polyuria Also had lost weight. She thinks her Victoza makes her control portions better However has been checking blood sugar somewhat sporadically No hypoglycemia with continuing her Lantus       Oral hypoglycemic drugs the patient is taking are: Metformin 500 mg twice a day      Side effects from medications have been: None INSULIN regimen is described as:  Lantus 25 units hs   Glucose monitoring:  prn        Glucometer: One Touch.      Blood Glucose readings from download: Range 88-166 with readings fasting, midmorning and midafternoon normally, highest reading after lunch and overall median 109 Hypoglycemia: None      Glycemic  control: A1c was 10 in 2012  Lab Results  Component Value Date   HGBA1C 11.1* 11/24/2013   HGBA1C  Value: 9.7 (NOTE)   The ADA recommends the following therapeutic goal for glycemic   control related to Hgb A1C measurement:   Goal of Therapy:   < 7.0% Hgb A1C   Reference: American Diabetes Association: Clinical Practice   Recommendations 2008, Diabetes Care,  2008, 31:(Suppl 1).* 07/29/2008   Lab Results  Component Value Date   CREATININE 0.9 11/24/2013    Self-care: The diet that the patient has been following is low fat and avoiding drinks with sugar     Meals: 2-3 meals per day.          Exercise:  was trying to walk, not now       Dietician visit: Most recent: At diagnosis and 2001.               Compliance with the medical regimen: fair Retinal exam: Most recent: 9/14, no known retinopathy     Weight history: 180-220 pounds, highest after pregnancy  Wt Readings from Last 3 Encounters:  01/17/14 183 lb 3.2 oz (83.099 kg)  11/24/13 194 lb (87.998 kg)      Medication List       This list is accurate as of: 01/17/14  8:21 AM.  Always use your most recent med list.  Canagliflozin 100 MG Tabs  Commonly known as:  INVOKANA  Take 1 tablet (100 mg total) by mouth daily before breakfast.     gabapentin 300 MG capsule  Commonly known as:  NEURONTIN  Take 300 mg by mouth 3 (three) times daily.     glucose blood test strip  Commonly known as:  ONE TOUCH ULTRA TEST  Use as instructed to check blood sugar 2 times per day dx code 250.02     HYDROcodone-acetaminophen 10-650 MG per tablet  Commonly known as:  LORCET  Take 1 tablet by mouth every 6 (six) hours as needed.     insulin glargine 100 UNIT/ML injection  Commonly known as:  LANTUS  Inject 25 Units into the skin at bedtime.     lisinopril 10 MG tablet  Commonly known as:  PRINIVIL,ZESTRIL  Take 10 mg by mouth daily.     metFORMIN 500 MG tablet  Commonly known as:  GLUCOPHAGE  Take 2 tablets (1,000 mg  total) by mouth 2 (two) times daily with a meal.     pravastatin 10 MG tablet  Commonly known as:  PRAVACHOL  Take 10 mg by mouth daily.     traMADol 50 MG tablet  Commonly known as:  ULTRAM     VICTOZA 18 MG/3ML Sopn  Generic drug:  Liraglutide  Inject 1.2 mg into the skin daily. Inject once daily at the same time        Allergies: No Known Allergies  Past Medical History  Diagnosis Date  . Diabetes mellitus   . Sciatica   . Hypertension     Past Surgical History  Procedure Laterality Date  . Cesarean section      Family History  Problem Relation Age of Onset  . Hypertension Father   . Diabetes Maternal Aunt   . Heart disease Neg Hx   . Thyroid disease Neg Hx     Social History:  reports that she has been smoking.  She does not have any smokeless tobacco history on file. She reports that she does not drink alcohol. Her drug history is not on file.    Review of Systems       Lipids: She has been on low-dose pravastatin for several years, was told this is for cardiovascular prophylaxis No recent records are available      Lab Results  Component Value Date   CHOL  Value: 122        ATP III CLASSIFICATION:  <200     mg/dL   Desirable  161-096200-239  mg/dL   Borderline High  >=045>=240    mg/dL   High        4/0/98111/12/2008          No joint  Pains. 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 Recently better    She has regular menstrual cycles every 30 days for 3 days         LABS:  No visits with results within 1 Week(s) from this visit. Latest known visit with results is:  Office Visit on 11/24/2013  Component Date Value Ref Range Status  . POC Glucose 11/24/2013 294* 70 - 99 mg/dl Final  . Hemoglobin B1YA1C 11/24/2013 11.1* 4.6 - 6.5 % Final   Glycemic Control Guidelines for People with Diabetes:Non Diabetic:  <6%Goal of Therapy: <7%Additional Action Suggested:  >8%   . Sodium 11/24/2013 134* 135 - 145 mEq/L Final  .  Potassium  11/24/2013 4.6  3.5 - 5.1 mEq/L Final  . Chloride 11/24/2013 103  96 - 112 mEq/L Final  . CO2 11/24/2013 22  19 - 32 mEq/L Final  . Glucose, Bld 11/24/2013 244* 70 - 99 mg/dL Final  . BUN 16/10/960404/30/2015 21  6 - 23 mg/dL Final  . Creatinine, Ser 11/24/2013 0.9  0.4 - 1.2 mg/dL Final  . Total Bilirubin 11/24/2013 0.1* 0.3 - 1.2 mg/dL Final  . Alkaline Phosphatase 11/24/2013 75  39 - 117 U/L Final  . AST 11/24/2013 24  0 - 37 U/L Final  . ALT 11/24/2013 15  0 - 35 U/L Final  . Total Protein 11/24/2013 7.2  6.0 - 8.3 g/dL Final  . Albumin 54/09/811904/30/2015 3.9  3.5 - 5.2 g/dL Final  . Calcium 14/78/295604/30/2015 9.4  8.4 - 10.5 mg/dL Final  . GFR 21/30/865704/30/2015 89.41  >60.00 mL/min Final    Physical Examination:  BP 128/76  Pulse 91  Temp(Src) 97.7 F (36.5 C)  Resp 16  Ht 5\' 4"  (1.626 m)  Wt 183 lb 3.2 oz (83.099 kg)  BMI 31.43 kg/m2  SpO2 94%    No edema    ASSESSMENT:  Diabetes type 2 with obesity  Her blood sugars are dramatically better with a multiple drug regimen of 2 g metformin, Invokana and Victoza along with her basal insulin of Lantus 25 units She has not checked very often and no readings after dinner Also has not exercised as directed   PLAN:   Start checking blood sugars regularly and discussed checking readings 3 times a week in the morning and on the other days 2 hours after eating.   She will restart her walking program  Consultation with diabetes educator for basic diabetes education and meal planning  No change in Lantus and this morning sugars are outside the 80-120 range Continue same doses of Invokana and Victoza  She will need to have microalbumin checked on her next visit as well as lipids  She can hold off on lisinopril for now since blood pressure is excellent without it   KUMAR,AJAY 01/17/2014, 8:21 AM

## 2014-01-17 NOTE — Patient Instructions (Signed)
Please check blood sugars at least half the time about 2 hours after any meal and times per week on waking up. Please bring blood sugar monitor to each visit  

## 2014-02-27 ENCOUNTER — Other Ambulatory Visit (INDEPENDENT_AMBULATORY_CARE_PROVIDER_SITE_OTHER): Payer: BC Managed Care – PPO

## 2014-02-27 DIAGNOSIS — E1165 Type 2 diabetes mellitus with hyperglycemia: Principal | ICD-10-CM

## 2014-02-27 DIAGNOSIS — IMO0001 Reserved for inherently not codable concepts without codable children: Secondary | ICD-10-CM

## 2014-02-27 LAB — LDL CHOLESTEROL, DIRECT: Direct LDL: 87.4 mg/dL

## 2014-02-27 LAB — BASIC METABOLIC PANEL
BUN: 15 mg/dL (ref 6–23)
CALCIUM: 9 mg/dL (ref 8.4–10.5)
CO2: 22 mEq/L (ref 19–32)
Chloride: 106 mEq/L (ref 96–112)
Creatinine, Ser: 0.9 mg/dL (ref 0.4–1.2)
GFR: 83.79 mL/min (ref >60.00)
Glucose, Bld: 84 mg/dL (ref 70–99)
Potassium: 4.3 mEq/L (ref 3.5–5.1)
Sodium: 134 mEq/L — ABNORMAL LOW (ref 135–145)

## 2014-02-27 LAB — URINALYSIS, ROUTINE W REFLEX MICROSCOPIC
Bilirubin Urine: NEGATIVE
HGB URINE DIPSTICK: NEGATIVE
KETONES UR: NEGATIVE
Leukocytes, UA: NEGATIVE
Nitrite: NEGATIVE
RBC / HPF: NONE SEEN (ref 0–?)
Specific Gravity, Urine: 1.015 (ref 1.000–1.030)
TOTAL PROTEIN, URINE-UPE24: NEGATIVE
Urobilinogen, UA: 0.2 (ref 0.0–1.0)
pH: 5.5 (ref 5.0–8.0)

## 2014-02-27 LAB — LIPID PANEL
CHOLESTEROL: 132 mg/dL (ref 0–200)
HDL: 24.2 mg/dL — ABNORMAL LOW (ref >39.00)
NonHDL: 107.8
TRIGLYCERIDES: 202 mg/dL — AB (ref 0.0–149.0)
Total CHOL/HDL Ratio: 5
VLDL: 40.4 mg/dL — ABNORMAL HIGH (ref 0.0–40.0)

## 2014-02-27 LAB — MICROALBUMIN / CREATININE URINE RATIO
Creatinine,U: 105.7 mg/dL
Microalb Creat Ratio: 1.7 mg/g (ref 0.0–30.0)
Microalb, Ur: 1.8 mg/dL (ref 0.0–1.9)

## 2014-02-27 LAB — HEMOGLOBIN A1C: HEMOGLOBIN A1C: 7.4 % — AB (ref 4.6–6.5)

## 2014-03-01 ENCOUNTER — Encounter: Payer: Self-pay | Admitting: Endocrinology

## 2014-03-01 ENCOUNTER — Other Ambulatory Visit: Payer: Self-pay | Admitting: *Deleted

## 2014-03-01 ENCOUNTER — Ambulatory Visit (INDEPENDENT_AMBULATORY_CARE_PROVIDER_SITE_OTHER): Payer: BC Managed Care – PPO | Admitting: Endocrinology

## 2014-03-01 VITALS — BP 123/91 | HR 96 | Temp 97.7°F | Resp 16 | Ht 64.0 in | Wt 180.4 lb

## 2014-03-01 DIAGNOSIS — E1165 Type 2 diabetes mellitus with hyperglycemia: Principal | ICD-10-CM

## 2014-03-01 DIAGNOSIS — IMO0001 Reserved for inherently not codable concepts without codable children: Secondary | ICD-10-CM

## 2014-03-01 DIAGNOSIS — E785 Hyperlipidemia, unspecified: Secondary | ICD-10-CM

## 2014-03-01 MED ORDER — LISINOPRIL 5 MG PO TABS: 5.0000 mg | ORAL_TABLET | Freq: Every day | ORAL | Status: DC

## 2014-03-01 NOTE — Progress Notes (Signed)
Patient ID: [name] Hurley, female   DOB: 1970-09-07, 43 y.o.   MRN: [id number]    Reason for Appointment:  for Type 2 Diabetes  History of Present Illness:          Diagnosis: Type 2 diabetes mellitus, date of diagnosis: 2000        Past history: She had symptoms of hyperglycemia or diagnoses and a glucose of 302.  She was initially treated with insulin doses tid for 2 yrs Subsequently she was treated with Glucophage and Avandamet In 2003 because of gestational diabetes and hyperglycemia she was treated with premixed insulin Afterwards she was continued on insulin and metformin; she thinks she has been taking Lantus for about 5 years Apparently her diabetes control has been very poor consistently but no records are available. She thinks her A1c was 10% in 2012 and also was high in 2010  Recent history:  She had  been on 25 units of Lantus without any mealtime insulin for quite some time but her A1c was 11.1% at her initial consultation She was started on Victoza and Invokana and her metformin dose was increased to 2000 mg and she is tolerating all 3 drugs at the current doses She had baseline blood sugars mostly in the 200+ range Her blood sugars have improved significantly and recently fasting blood sugars are near-normal She has done only occasional readings after meals and had only one high reading after eating a dessert Also has lost more weight. The Victoza makes her control portions better No hypoglycemia with the same dose of Lantus Lantus       Oral hypoglycemic drugs the patient is taking are: Metformin 1000 mg twice a day , Invokana 100 mg daily      Side effects from medications have been: None INSULIN regimen is described as:  Lantus 25 units hs   Glucose monitoring:  1.1 times a day        Glucometer: One Touch ultra mini.      Blood Glucose readings from download: Range 80-136 fasting Nonfasting 89-215 with only 2 readings over 150, both after supper  overall median  97 Hypoglycemia: None      Glycemic control: A1c was 10 in 2012  Lab Results  Component Value Date   HGBA1C 7.4* 02/27/2014   HGBA1C 11.1* 11/24/2013   HGBA1C  Value: 9.7 (NOTE)   The ADA recommends the following therapeutic goal for glycemic   control related to Hgb A1C measurement:   Goal of Therapy:   < 7.0% Hgb A1C   Reference: American Diabetes Association: Clinical Practice   Recommendations 2008, Diabetes Care,  2008, 31:(Suppl 1).* 07/29/2008   Lab Results  Component Value Date   MICROALBUR 1.8 02/27/2014   CREATININE 0.9 02/27/2014    Self-care: The diet that the patient has been following is low fat and avoiding drinks with sugar     Meals: 2-3 meals per day.          Exercise: She is walking 2-3 times a week and up to 45 minutes    Dietician visit: Most recent: At diagnosis and 2001.               Compliance with the medical regimen: fair Retinal exam: Most recent: 9/14, no known retinopathy     Weight history: 180-220 pounds, highest after pregnancy  Wt Readings from Last 3 Encounters:  03/01/14 180 lb 6.4 oz (81.829 kg)  01/17/14 183 lb 3.2 oz (83.099 kg)  11/24/13 194 lb (  87.998 kg)      Medication List       This list is accurate as of: 03/01/14  8:38 AM.  Always use your most recent med list.               Canagliflozin 100 MG Tabs  Commonly known as:  INVOKANA  Take 1 tablet (100 mg total) by mouth daily before breakfast.     gabapentin 300 MG capsule  Commonly known as:  NEURONTIN  Take 300 mg by mouth 3 (three) times daily.     glucose blood test strip  Commonly known as:  ONE TOUCH ULTRA TEST  Use as instructed to check blood sugar 2 times per day dx code 250.02     insulin glargine 100 UNIT/ML injection  Commonly known as:  LANTUS  Inject 25 Units into the skin at bedtime.     metFORMIN 500 MG tablet  Commonly known as:  GLUCOPHAGE  Take 2 tablets (1,000 mg total) by mouth 2 (two) times daily with a meal.     pravastatin 10 MG tablet  Commonly  known as:  PRAVACHOL  Take 10 mg by mouth daily.     traMADol 50 MG tablet  Commonly known as:  ULTRAM     VICTOZA 18 MG/3ML Sopn  Generic drug:  Liraglutide  Inject 1.2 mg into the skin daily. Inject once daily at the same time        Allergies: No Known Allergies  Past Medical History  Diagnosis Date  . Diabetes mellitus   . Sciatica   . Hypertension     Past Surgical History  Procedure Laterality Date  . Cesarean section      Family History  Problem Relation Age of Onset  . Hypertension Father   . Diabetes Maternal Aunt   . Heart disease Neg Hx   . Thyroid disease Neg Hx     Social History:  reports that she has been smoking.  She does not have any smokeless tobacco history on file. She reports that she does not drink alcohol. Her drug history is not on file.    Review of Systems       Lipids: She has been on low-dose pravastatin for several years, was told this is for cardiovascular prophylaxis LDL is controlled but has relatively low HDL. Taking OTC every other day      Lab Results  Component Value Date   CHOL 132 02/27/2014   HDL 24.20* 02/27/2014   LDLDIRECT 87.4 02/27/2014   TRIG 202.0* 02/27/2014   CHOLHDL 5 02/27/2014        No joint  Pains. 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 Recently better    She has regular menstrual cycles every 30 days for 3 days         LABS:  Appointment on 02/27/2014  Component Date Value Ref Range Status  . Hemoglobin A1C 02/27/2014 7.4* 4.6 - 6.5 % Final   Glycemic Control Guidelines for People with Diabetes:Non Diabetic:  <6%Goal of Therapy: <7%Additional Action Suggested:  >8%   . Sodium 02/27/2014 134* 135 - 145 mEq/L Final  . Potassium 02/27/2014 4.3  3.5 - 5.1 mEq/L Final  . Chloride 02/27/2014 106  96 - 112 mEq/L Final  . CO2 02/27/2014 22  19 - 32 mEq/L Final  . Glucose, Bld 02/27/2014 84  70 - 99 mg/dL Final  . BUN 16/04/9603 15  6 - 23  mg/dL Final  .  Creatinine, Ser 02/27/2014 0.9  0.4 - 1.2 mg/dL Final  . Calcium 40/98/1191 9.0  8.4 - 10.5 mg/dL Final  . GFR 47/82/9562 83.79  >60.00 mL/min Final  . Cholesterol 02/27/2014 132  0 - 200 mg/dL Final   ATP III Classification       Desirable:  < 200 mg/dL               Borderline High:  200 - 239 mg/dL          High:  > = 130 mg/dL  . Triglycerides 02/27/2014 202.0* 0.0 - 149.0 mg/dL Final   Normal:  <865 mg/dLBorderline High:  150 - 199 mg/dL  . HDL 02/27/2014 24.20* >39.00 mg/dL Final  . VLDL 78/46/9629 40.4* 0.0 - 40.0 mg/dL Final  . Total CHOL/HDL Ratio 02/27/2014 5   Final                  Men          Women1/2 Average Risk     3.4          3.3Average Risk          5.0          4.42X Average Risk          9.6          7.13X Average Risk          15.0          11.0                      . NonHDL 02/27/2014 107.80   Final   NOTE:  Non-HDL goal should be 30 mg/dL higher than patient's LDL goal (i.e. LDL goal of < 70 mg/dL, would have non-HDL goal of < 100 mg/dL)  . Direct LDL 02/27/2014 87.4   Final   Optimal:  <100 mg/dLNear or Above Optimal:  100-129 mg/dLBorderline High:  130-159 mg/dLHigh:  160-189 mg/dLVery High:  >190 mg/dL  . Microalb, Ur 02/27/2014 1.8  0.0 - 1.9 mg/dL Final  . Creatinine,U 52/84/1324 105.7   Final  . Microalb Creat Ratio 02/27/2014 1.7  0.0 - 30.0 mg/g Final  . Color, Urine 02/27/2014 YELLOW  Yellow;Lt. Yellow Final  . APPearance 02/27/2014 CLEAR  Clear Final  . Specific Gravity, Urine 02/27/2014 1.015  1.000-1.030 Final  . pH 02/27/2014 5.5  5.0 - 8.0 Final  . Total Protein, Urine 02/27/2014 NEGATIVE  Negative Final  . Urine Glucose 02/27/2014 >=1000* Negative Final  . Ketones, ur 02/27/2014 NEGATIVE  Negative Final  . Bilirubin Urine 02/27/2014 NEGATIVE  Negative Final  . Hgb urine dipstick 02/27/2014 NEGATIVE  Negative Final  . Urobilinogen, UA 02/27/2014 0.2  0.0 - 1.0 Final  . Leukocytes, UA 02/27/2014 NEGATIVE  Negative Final  . Nitrite 02/27/2014 NEGATIVE   Negative Final  . WBC, UA 02/27/2014 0-2/hpf  0-2/hpf Final  . RBC / HPF 02/27/2014 none seen  0-2/hpf Final  . Squamous Epithelial / LPF 02/27/2014 Rare(0-4/hpf)  Rare(0-4/hpf) Final  . Yeast, UA 02/27/2014 Presence of* None Final    Physical Examination:  BP 123/91  Pulse 96  Temp(Src) 97.7 F (36.5 C)  Resp 16  Ht 5\' 4"  (1.626 m)  Wt 180 lb 6.4 oz (81.829 kg)  BMI 30.95 kg/m2  SpO2 96%    Not indicated    ASSESSMENT:  Diabetes type 2 with obesity  Her blood sugars are overall well controlledwith a multiple  drug regimen of 2 g metformin, Invokana and Victoza along with her basal insulin of Lantus 25 units Although her A1c is only down to 7.4 her blood sugars at home look excellent She does need to do more readings after meals which she forgets to do as instructed She had lost a total of 14 pounds with a new regimen and is trying to walk now since her back pain is better  Hypertension: Blood pressure appears relatively higher  Dyslipidemia: She will try to take fish oil twice a day instead of every other day to help with triglycerides   PLAN:   Start checking blood sugars more after meals  Consider reducing Lantus if morning sugars are low  No change in diabetes regimen  Consider fenofibrate if triglycerides still high  Restart lisinopril with 5 mg  daily and followup in 3 months  Zakee Deerman 03/01/2014, 8:38 AM

## 2014-03-01 NOTE — Patient Instructions (Signed)
Please check blood sugars at least half the time about 2 hours after any meal and times per week on waking up. Please bring blood sugar monitor to each visit  Take 2 fish oil daily

## 2014-03-02 ENCOUNTER — Other Ambulatory Visit: Payer: Self-pay | Admitting: Endocrinology

## 2014-03-20 ENCOUNTER — Encounter: Payer: BC Managed Care – PPO | Attending: Endocrinology | Admitting: Dietician

## 2014-03-20 ENCOUNTER — Encounter: Payer: Self-pay | Admitting: Dietician

## 2014-03-20 VITALS — Ht 64.0 in | Wt 181.9 lb

## 2014-03-20 DIAGNOSIS — Z713 Dietary counseling and surveillance: Secondary | ICD-10-CM | POA: Insufficient documentation

## 2014-03-20 DIAGNOSIS — IMO0001 Reserved for inherently not codable concepts without codable children: Secondary | ICD-10-CM | POA: Insufficient documentation

## 2014-03-20 DIAGNOSIS — Z794 Long term (current) use of insulin: Secondary | ICD-10-CM | POA: Diagnosis not present

## 2014-03-20 DIAGNOSIS — E1165 Type 2 diabetes mellitus with hyperglycemia: Principal | ICD-10-CM

## 2014-03-20 NOTE — Progress Notes (Signed)
  Medical Nutrition Therapy:  Appt start time: 1550 end time:  1635.   Assessment:  Primary concerns today:[name] Hurley is here today since Dr. Lucianne Muss wanted her to see her dietitian. States that she does not need to be here. Hgb A1c 7.4% 02/27/2014 down from 12%. Has had diabetes education in the past and feels pretty comfortable with diabetes knowledge. States that she is a "picky eater" and doesn't like different foods touching. Does not eat a lot at one time but instead snacks. Recently started Invokana which has made her less hungry than before.     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 Skips about 7 meals per week and does not eat out during the week.   Checks blood sugar in the morning and averaging under 100 mg/dl, before dinner also below 100 mg/dl. Has lost about 30 lbs since April by "cutting back".   Preferred Learning Style:   No preference indicated   Learning Readiness:   Ready  MEDICATIONS: Lantus, Metformin, Victoza, Invokana   DIETARY INTAKE:  Usual eating pattern includes 2 meals and 2 snacks per day.  24-hr recall:  B ( AM): grapes or fruit  Snk ( AM): none (oatmeal on days off) L ( PM): beans and rice Snk ( PM): none or fruit D ( PM): ribs and cabbage with potato Snk ( PM): none Beverages:water, Crystal Light, flavored water, 4-5 cups low fat milk   Usual physical activity: trying to walk 2 x week for 40 minutes or more  Estimated energy needs: 1800 calories 200 g carbohydrates 135 g protein 50 g fat  Progress Towards Goal(s):  In progress.   Nutritional Diagnosis:  [place]-2.1 Inpaired nutrition utilization As related to glucose metabolism .  As evidenced by Hgb A1c of 7.4%.    Intervention:  Nutrition counseling provided. Goals:  Follow Diabetes Meal Plan as instructed  Eat 3 meals and 2 snacks, every 3-5 hrs  Limit carbohydrate intake to 30-45 grams carbohydrate/meal  Limit carbohydrate intake to 15  grams carbohydrate/snack  Add lean protein foods to meals/snacks  Monitor glucose levels as instructed by your doctor  Aim for 30 mins of physical activity daily  Bring food record and glucose log to your next nutrition visit  Teaching Method Utilized:  Visual Auditory Hands on  Handouts given during visit include:  Living Well With Diabetes  Yellow Card  Blood Glucose Monitoring  15 g CHO Snacks  Barriers to learning/adherence to lifestyle change: none  Demonstrated degree of understanding via:  Teach Back   Monitoring/Evaluation:  Dietary intake, exercise, and body weight prn.

## 2014-03-20 NOTE — Patient Instructions (Signed)
Goals:  Follow Diabetes Meal Plan as instructed  Eat 3 meals and 2 snacks, every 3-5 hrs  Limit carbohydrate intake to 30-45 grams carbohydrate/meal  Limit carbohydrate intake to 15 grams carbohydrate/snack  Add lean protein foods to meals/snacks  Monitor glucose levels as instructed by your doctor  Aim for 30 mins of physical activity daily  Bring food record and glucose log to your next nutrition visit 

## 2014-04-01 ENCOUNTER — Other Ambulatory Visit: Payer: Self-pay | Admitting: Endocrinology

## 2014-04-27 ENCOUNTER — Telehealth: Payer: Self-pay | Admitting: Endocrinology

## 2014-04-27 ENCOUNTER — Other Ambulatory Visit: Payer: Self-pay | Admitting: *Deleted

## 2014-04-27 MED ORDER — VICTOZA 18 MG/3ML ~~LOC~~ SOPN: 1.2000 mg | PEN_INJECTOR | Freq: Every day | SUBCUTANEOUS | Status: DC

## 2014-04-27 NOTE — Telephone Encounter (Signed)
Patient states her pharmacy sent her a letter stating that she needed Dr. Lucianne MussKumar to send over a new rx for Victoza     [facility] [place]   Thank you

## 2014-04-27 NOTE — Telephone Encounter (Signed)
rx sent

## 2014-05-14 ENCOUNTER — Other Ambulatory Visit: Payer: Self-pay | Admitting: Endocrinology

## 2014-05-29 ENCOUNTER — Other Ambulatory Visit: Payer: BC Managed Care – PPO

## 2014-06-01 ENCOUNTER — Ambulatory Visit (INDEPENDENT_AMBULATORY_CARE_PROVIDER_SITE_OTHER): Payer: BC Managed Care – PPO | Admitting: Endocrinology

## 2014-06-01 ENCOUNTER — Encounter: Payer: Self-pay | Admitting: Endocrinology

## 2014-06-01 VITALS — BP 142/84 | HR 100 | Temp 97.6°F | Resp 14 | Ht 64.0 in | Wt 178.8 lb

## 2014-06-01 DIAGNOSIS — E1165 Type 2 diabetes mellitus with hyperglycemia: Secondary | ICD-10-CM

## 2014-06-01 DIAGNOSIS — E785 Hyperlipidemia, unspecified: Secondary | ICD-10-CM

## 2014-06-01 DIAGNOSIS — IMO0002 Reserved for concepts with insufficient information to code with codable children: Secondary | ICD-10-CM

## 2014-06-01 DIAGNOSIS — Z23 Encounter for immunization: Secondary | ICD-10-CM

## 2014-06-01 LAB — BASIC METABOLIC PANEL
BUN: 16 mg/dL (ref 6–23)
CALCIUM: 8.9 mg/dL (ref 8.4–10.5)
CO2: 20 meq/L (ref 19–32)
CREATININE: 1.1 mg/dL (ref 0.4–1.2)
Chloride: 108 mEq/L (ref 96–112)
GFR: 72.75 mL/min (ref >60.00)
Glucose, Bld: 71 mg/dL (ref 70–99)
Potassium: 4.4 mEq/L (ref 3.5–5.1)
Sodium: 137 mEq/L (ref 135–145)

## 2014-06-01 LAB — LIPID PANEL
CHOL/HDL RATIO: 5
Cholesterol: 117 mg/dL (ref 0–200)
HDL: 22.4 mg/dL — AB (ref >39.00)
LDL Cholesterol: 65 mg/dL (ref 0–99)
NonHDL: 94.6
Triglycerides: 149 mg/dL (ref 0.0–149.0)
VLDL: 29.8 mg/dL (ref 0.0–40.0)

## 2014-06-01 LAB — HEMOGLOBIN A1C: Hgb A1c MFr Bld: 6.9 % — ABNORMAL HIGH (ref 4.6–6.5)

## 2014-06-01 NOTE — Progress Notes (Signed)
Patient ID: [name] Hurley, female   DOB: 29-Jan-1971, 43 y.o.   MRN: [id number]    Reason for Appointment:  for Type 2 Diabetes  History of Present Illness:          Diagnosis: Type 2 diabetes mellitus, date of diagnosis: 2000        Past history: She had symptoms of hyperglycemia or diagnoses and a glucose of 302.  She was initially treated with insulin doses tid for 2 yrs Subsequently she was treated with Glucophage and Avandamet In 2003 because of gestational diabetes and hyperglycemia she was treated with premixed insulin Afterwards she was continued on insulin and metformin; she thinks she has been taking Lantus for about 5 years Apparently her diabetes control has been very poor consistently but no records are available. She thinks her A1c was 10% in 2012 and also was high in 2010 She was on 25 units of Lantus without any mealtime insulin for quite some time but her A1c was 11.1% at her initial consultation in 10/2013  Recent history:  Her blood sugars are overall well controlled with a multiple drug regimen of 2 g metformin, Invokana and Victoza along with her basal insulin of Lantus 25 units She had baseline blood sugars mostly in the 200+ range prior to starting Invokana and Victoza and increasing her metformin However A1c is not available as yet She is however checking her blood sugars mostly in the morning and despite reminders usually not checking after meals Fasting glucose average appears to be relatively better compared to her last visit She has lost a little more weight Continues to be compliant with her exercise regimen She has done only 2 readings after meals and had only one high reading after eating a dessert No hypoglycemia with the current does of Lantus       Oral hypoglycemic drugs the patient is taking are: Metformin 1000 mg twice a day , Invokana 100 mg daily      Side effects from medications have been: None INSULIN regimen is described as:  Lantus 25 units hs    Glucose monitoring:  1.1 times a day        Glucometer: One Touch ultra mini.      Blood Glucose readings from download:  PREMEAL Breakfast Lunch Dinner Bedtime Overall  Glucose range: 82-137  97 141, 211   Mean/median: 107    107   Hypoglycemia: None      Glycemic control:    Lab Results  Component Value Date   HGBA1C 7.4* 02/27/2014   HGBA1C 11.1* 11/24/2013   HGBA1C * 07/29/2008    9.7 (NOTE)   The ADA recommends the following therapeutic goal for glycemic   control related to Hgb A1C measurement:   Goal of Therapy:   < 7.0% Hgb A1C   Reference: American Diabetes Association: Clinical Practice   Recommendations 2008, Diabetes Care,  2008, 31:(Suppl 1).   Lab Results  Component Value Date   MICROALBUR 1.8 02/27/2014   CREATININE 0.9 02/27/2014    Self-care: The diet that the patient has been following is low fat and avoiding drinks with sugar     Meals: 2-3 meals per day.          Exercise: She is walking 3 times a week and up to 45 minutes    Dietician visit: Most recent: At diagnosis and 2001.               Compliance with the medical regimen: fair Retinal  exam: Most recent: 9/14, no known retinopathy     Weight history: 180-220 pounds, highest after pregnancy  Wt Readings from Last 3 Encounters:  06/01/14 178 lb 12.8 oz (81.103 kg)  03/20/14 181 lb 14.4 oz (82.509 kg)  03/01/14 180 lb 6.4 oz (81.829 kg)      Medication List       This list is accurate as of: 06/01/14  9:13 AM.  Always use your most recent med list.               glucose blood test strip  Commonly known as:  ONE TOUCH ULTRA TEST  Use as instructed to check blood sugar 2 times per day dx code 250.02     insulin glargine 100 UNIT/ML injection  Commonly known as:  LANTUS  Inject 25 Units into the skin at bedtime.     INVOKANA 100 MG Tabs tablet  Generic drug:  canagliflozin  TAKE ONE TABLET BY MOUTH ONCE DAILY BEFORE BREAKFAST     lisinopril 5 MG tablet  Commonly known as:   PRINIVIL,ZESTRIL  Take 1 tablet (5 mg total) by mouth daily.     metFORMIN 500 MG tablet  Commonly known as:  GLUCOPHAGE  TAKE TWO TABLETS BY MOUTH TWICE DAILY WITH FOOD     pravastatin 10 MG tablet  Commonly known as:  PRAVACHOL  Take 10 mg by mouth daily.     traMADol 50 MG tablet  Commonly known as:  ULTRAM     VICTOZA 18 MG/3ML Sopn  Generic drug:  Liraglutide  Inject 1.2 mg into the skin daily. Inject once daily at the same time        Allergies: No Known Allergies  Past Medical History  Diagnosis Date  . Diabetes mellitus   . Sciatica   . Hypertension     Past Surgical History  Procedure Laterality Date  . Cesarean section      Family History  Problem Relation Age of Onset  . Hypertension Father   . Diabetes Maternal Aunt   . Heart disease Neg Hx   . Thyroid disease Neg Hx     Social History:  reports that she has been smoking.  She does not have any smokeless tobacco history on file. She reports that she does not drink alcohol. Her drug history is not on file.    Review of Systems       Lipids: She has been on low-dose pravastatin for several years, was told this is for cardiovascular prophylaxis LDL is controlled but has significantly low HDL.  Now taking fish oil twice a day as directed  Lab Results  Component Value Date   CHOL 132 02/27/2014   HDL 24.20* 02/27/2014   LDLDIRECT 87.4 02/27/2014   TRIG 202.0* 02/27/2014   CHOLHDL 5 02/27/2014      LABS:  pending  Physical Examination:  BP 142/84 mmHg  Pulse 100  Temp(Src) 97.6 F (36.4 C)  Resp 14  Ht 5\' 4"  (1.626 m)  Wt 178 lb 12.8 oz (81.103 kg)  BMI 30.68 kg/m2  SpO2 94%    Repeat blood pressure 150/78    ASSESSMENT:  Diabetes type 2 with obesity  Her blood sugars are still appearing well controlled She is taking 2 g metformin, Invokana and Victoza along with her basal insulin of Lantus 25 units Discussed that she may have post prandial hyperglycemia and not clear if she  has high readings after any of her meals; has only one high  reading recorded after eating something sweet  Hypertension: Blood pressure appears high normal but today has been stressed  She did not start the lisinopril that was prescribed on the last visit  Dyslipidemia: She will have her lipids checked today and consider adding either niacin or fenofibrate   PLAN:   Start checking blood sugars regularly after meals  Reduce Lantus to 22  Continue to follow blood pressure  Jorma Tassinari 06/01/2014, 9:13 AM

## 2014-06-01 NOTE — Patient Instructions (Signed)
Lantus 22 units as long am sugar <120  Please check blood sugars at least half the time about 2 hours after any meal and times per week on waking up. Please bring blood sugar monitor to each visit

## 2014-06-11 ENCOUNTER — Other Ambulatory Visit: Payer: Self-pay | Admitting: Endocrinology

## 2014-07-23 ENCOUNTER — Other Ambulatory Visit: Payer: Self-pay | Admitting: Endocrinology

## 2014-08-29 ENCOUNTER — Other Ambulatory Visit (INDEPENDENT_AMBULATORY_CARE_PROVIDER_SITE_OTHER): Payer: BC Managed Care – PPO

## 2014-08-29 DIAGNOSIS — IMO0002 Reserved for concepts with insufficient information to code with codable children: Secondary | ICD-10-CM

## 2014-08-29 DIAGNOSIS — E1165 Type 2 diabetes mellitus with hyperglycemia: Secondary | ICD-10-CM

## 2014-08-29 LAB — BASIC METABOLIC PANEL
BUN: 15 mg/dL (ref 6–23)
CALCIUM: 9.1 mg/dL (ref 8.4–10.5)
CO2: 24 mEq/L (ref 19–32)
CREATININE: 0.87 mg/dL (ref 0.40–1.20)
Chloride: 106 mEq/L (ref 96–112)
GFR: 90.29 mL/min (ref >60.00)
Glucose, Bld: 121 mg/dL — ABNORMAL HIGH (ref 70–99)
Potassium: 4.5 mEq/L (ref 3.5–5.1)
SODIUM: 135 meq/L (ref 135–145)

## 2014-08-29 LAB — URINALYSIS, ROUTINE W REFLEX MICROSCOPIC
Bilirubin Urine: NEGATIVE
KETONES UR: NEGATIVE
Leukocytes, UA: NEGATIVE
Nitrite: NEGATIVE
Specific Gravity, Urine: >=1.03 — AB (ref 1.000–1.030)
Total Protein, Urine: NEGATIVE
Urine Glucose: >1000 — AB
Urobilinogen, UA: 0.2 (ref 0.0–1.0)
pH: 5.5 (ref 5.0–8.0)

## 2014-08-29 LAB — MICROALBUMIN / CREATININE URINE RATIO
Creatinine,U: 160.8 mg/dL
MICROALB UR: 8.2 mg/dL — AB (ref 0.0–1.9)
Microalb Creat Ratio: 5.1 mg/g (ref 0.0–30.0)

## 2014-08-29 LAB — HEMOGLOBIN A1C: HEMOGLOBIN A1C: 7.2 % — AB (ref 4.6–6.5)

## 2014-09-01 ENCOUNTER — Ambulatory Visit (INDEPENDENT_AMBULATORY_CARE_PROVIDER_SITE_OTHER): Payer: BC Managed Care – PPO | Admitting: Endocrinology

## 2014-09-01 ENCOUNTER — Other Ambulatory Visit: Payer: Self-pay | Admitting: *Deleted

## 2014-09-01 ENCOUNTER — Encounter: Payer: Self-pay | Admitting: Endocrinology

## 2014-09-01 VITALS — BP 162/93 | HR 100 | Temp 97.8°F | Resp 16 | Ht 64.0 in | Wt 181.2 lb

## 2014-09-01 DIAGNOSIS — M25571 Pain in right ankle and joints of right foot: Secondary | ICD-10-CM

## 2014-09-01 DIAGNOSIS — E1165 Type 2 diabetes mellitus with hyperglycemia: Secondary | ICD-10-CM

## 2014-09-01 DIAGNOSIS — IMO0002 Reserved for concepts with insufficient information to code with codable children: Secondary | ICD-10-CM

## 2014-09-01 DIAGNOSIS — I10 Essential (primary) hypertension: Secondary | ICD-10-CM

## 2014-09-01 MED ORDER — LISINOPRIL 10 MG PO TABS: 10.0000 mg | ORAL_TABLET | Freq: Every day | ORAL | Status: DC

## 2014-09-01 MED ORDER — PRAVASTATIN SODIUM 10 MG PO TABS: 10.0000 mg | ORAL_TABLET | Freq: Every day | ORAL | Status: DC

## 2014-09-01 NOTE — Progress Notes (Signed)
Patient ID: [name] Hurley, female   DOB: 19-Oct-1970, 44 y.o.   MRN: [id number]    Reason for Appointment: f/u for Type 2 Diabetes  History of Present Illness:          Diagnosis: Type 2 diabetes mellitus, date of diagnosis: 2000        Past history: She had symptoms of hyperglycemia or diagnoses and a glucose of 302.  She was initially treated with insulin doses tid for 2 yrs Subsequently she was treated with Glucophage and Avandamet In 2003 because of gestational diabetes and hyperglycemia she was treated with premixed insulin Afterwards she was continued on insulin and metformin; she thinks she has been taking Lantus for about 5 years Apparently her diabetes control has been very poor consistently but no records are available. She thinks her A1c was 10% in 2012 and also was high in 2010 She was on 25 units of Lantus without any mealtime insulin for quite some time and her A1c was 11.1% at her initial consultation in 10/2013  Recent history:      Oral hypoglycemic drugs the patient is taking are: Metformin 1000 mg twice a day , Invokana 100 mg daily pcl      INSULIN regimen is described as:  Lantus 22 units hs   Her blood sugars are overall not as well controlled with A1c going up to 7.2% She is on a multiple drug regimen of 2 g metformin, Invokana and Victoza along with her basal insulin of Lantus 22 units She has been noncompliant with checking her blood sugar as she has not had a glucose monitor in over a month She thinks that previously her blood sugars were in the 70s in the mornings and about 130 after eating  Her Lantus was reduced on the last visit because of low normal fasting readings. She now says that she is taking her Invokana after lunch instead of in the morning as directed  Although she had been doing fairly well with her exercise program she has not been been able to do any recently for various reasons including back pain. Her lab glucose is fairly good at 121.  Side  effects from medications have been: None No hypoglycemia with the current does of Lantus   Glucose monitoring:  1.1 times a day        Glucometer: One Touch ultra mini.      Blood Glucose readings not done recently   Hypoglycemia: None      Self-care: The diet that the patient has been following is low fat and avoiding drinks with sugar     Meals: 2-3 meals per day.          Exercise: She is not walking, previously was doing this 3 times a week and up to 45 minutes    Dietician visit: Most recent: At diagnosis and 2001.               Compliance with the medical regimen: fair Retinal exam: Most recent: 9/14, no known retinopathy   Microalbumin level: Normal in 2/16   Weight history: 180-220 pounds, highest after pregnancy  Wt Readings from Last 3 Encounters:  09/01/14 181 lb 3.2 oz (82.192 kg)  06/01/14 178 lb 12.8 oz (81.103 kg)  03/20/14 181 lb 14.4 oz (82.509 kg)   Glycemic control:    Lab Results  Component Value Date   HGBA1C 7.2* 08/29/2014   HGBA1C 6.9* 06/01/2014   HGBA1C 7.4* 02/27/2014   Lab Results  Component  Value Date   MICROALBUR 8.2* 08/29/2014   LDLCALC 65 06/01/2014   CREATININE 0.87 08/29/2014      Medication List       This list is accurate as of: 09/01/14  8:32 AM.  Always use your most recent med list.               glucose blood test strip  Commonly known as:  ONE TOUCH ULTRA TEST  Use as instructed to check blood sugar 2 times per day dx code 250.02     INVOKANA 100 MG Tabs tablet  Generic drug:  canagliflozin  TAKE ONE TABLET BY MOUTH ONCE DAILY BEFORE  BREAKFAST     LANTUS SOLOSTAR 100 UNIT/ML Solostar Pen  Generic drug:  Insulin Glargine  22 Units at bedtime.     metFORMIN 500 MG tablet  Commonly known as:  GLUCOPHAGE  TAKE TWO TABLETS BY MOUTH TWICE DAILY WITH MEALS     pravastatin 10 MG tablet  Commonly known as:  PRAVACHOL  Take 10 mg by mouth daily.     traMADol 50 MG tablet  Commonly known as:  ULTRAM     VICTOZA 18  MG/3ML Sopn  Generic drug:  Liraglutide  Inject 1.2 mg into the skin daily. Inject once daily at the same time        Allergies: No Known Allergies  Past Medical History  Diagnosis Date  . Diabetes mellitus   . Sciatica   . Hypertension     Past Surgical History  Procedure Laterality Date  . Cesarean section      Family History  Problem Relation Age of Onset  . Hypertension Father   . Diabetes Maternal Aunt   . Heart disease Neg Hx   . Thyroid disease Neg Hx     Social History:  reports that she has been smoking.  She does not have any smokeless tobacco history on file. She reports that she does not drink alcohol. Her drug history is not on file.    Review of Systems   She is complaining about persistent right foot pain which is mostly on the top and feels like it's throbbing and aching.  This is present at rest also and at night.  She thinks it is somewhat tender.  Has no tingling or numbness or sharp pains or burning sensation.  Has some difficulty with walking also.      Lipids: She has been on low-dose pravastatin for several years, was told this is for cardiovascular prophylaxis LDL is controlled but has significantly low HDL.  Is taking fish oil twice a day as directed  Lab Results  Component Value Date   CHOL 117 06/01/2014   HDL 22.40* 06/01/2014   LDLCALC 65 06/01/2014   LDLDIRECT 87.4 02/27/2014   TRIG 149.0 06/01/2014   CHOLHDL 5 06/01/2014    EXAMINATION:   BP 162/93 mmHg  Pulse 100  Temp(Src) 97.8 F (36.6 C)  Resp 16  Ht 5\' 4"  (1.626 m)  Wt 181 lb 3.2 oz (82.192 kg)  BMI 31.09 kg/m2  SpO2 97%    no pedal edema    ASSESSMENT/PLAN  Diabetes type 2 with obesity  Her blood sugars are under fair control with A1c just over 7% and relatively higher on this visit. She is probably having some postprandial hyperglycemia but has not monitored her blood sugar for at least a month Since fasting glucose is normal in the lab she will continue the  same dose of Lantus,  22 units  Also discussed that she may do better with taking the Invokana before breakfast rather than after lunch Currently she is not exercising and does need to start this when able to. Also will continue same doses of metformin and Victoza Discussed glucose monitoring timing, blood sugar targets the need for consistent diet and exercise regimen  Hypertension: Blood pressure appears high again, likely she has hypertension. She needs to start back on her lisinopril 10 mg and follow-up in 6 weeks  Foot pain on the right: Appears to be musculoskeletal and she can see podiatrist for this  Hyperlipidemia: Will have follow-up done on the next visit, currently managed with pravastatin low-dose and fish oil  Pearly Apachito 09/01/2014, 8:32 AM

## 2014-09-01 NOTE — Patient Instructions (Addendum)
Take Invokana before breakfast  Restart Lisinopril 10mg   Please check blood sugars at least half the time about 2 hours after any meal and 3 times per week on waking up. Please bring blood sugar monitor to each visit. Recommended blood sugar levels about 2 hours after meal is 140-160 and on waking up 90-130

## 2014-09-14 ENCOUNTER — Ambulatory Visit: Payer: Self-pay | Admitting: Podiatry

## 2014-09-18 ENCOUNTER — Encounter: Payer: Self-pay | Admitting: Podiatry

## 2014-09-18 ENCOUNTER — Ambulatory Visit (INDEPENDENT_AMBULATORY_CARE_PROVIDER_SITE_OTHER): Payer: BC Managed Care – PPO | Admitting: Podiatry

## 2014-09-18 ENCOUNTER — Ambulatory Visit (INDEPENDENT_AMBULATORY_CARE_PROVIDER_SITE_OTHER): Payer: BC Managed Care – PPO

## 2014-09-18 VITALS — BP 134/85 | HR 90 | Resp 16

## 2014-09-18 DIAGNOSIS — M2041 Other hammer toe(s) (acquired), right foot: Secondary | ICD-10-CM

## 2014-09-18 DIAGNOSIS — M79671 Pain in right foot: Secondary | ICD-10-CM

## 2014-09-18 DIAGNOSIS — M779 Enthesopathy, unspecified: Secondary | ICD-10-CM

## 2014-09-18 MED ORDER — PREDNISONE (PAK) 5 MG PO TABS: ORAL_TABLET | ORAL | Status: DC

## 2014-09-18 MED ORDER — TRIAMCINOLONE ACETONIDE 10 MG/ML IJ SUSP
10.0000 mg | Freq: Once | INTRAMUSCULAR | Status: AC
  Administered 2014-09-18: 10 mg

## 2014-09-18 NOTE — Progress Notes (Signed)
Subjective:     Patient ID: [name] Hurley, female   DOB: [birth year], 44 y.o.   MRN: [id number]  HPI patient presents stating I been having a lot of pain in my foot that's gotten worse over the last year. It hurts the most in my toes and then my ankle and not remember a specific injury. Patient states that she is under good control of her diabetes   Review of Systems  All other systems reviewed and are negative.      Objective:   Physical Exam  Constitutional: She is oriented to person, place, and time.  Cardiovascular: Intact distal pulses.   Musculoskeletal: Normal range of motion.  Neurological: She is oriented to person, place, and time.  Skin: Skin is warm.  Nursing note and vitals reviewed.  neurovascular status was found to be intact with muscle strength adequate range of motion within normal limits of the subtalar joint with some restriction in the ankle joint because of pain right side. Patient has elevated second and third toes on the right foot with moderate rigid contracture and is noted to have quite a bit of discomfort in the second metatarsophalangeal joint and in the right sinus tarsi with palpation. Digits are well-perfused and she's well oriented 3     Assessment:     Possibility for inflammatory capsulitis second MPJ right with structural changes within the lesser digits and rigid hammertoe deformity. Possible for compensatory sinus tarsitis right    Plan:     H&P and x-rays reviewed and today I did a proximal nerve block of the right forefoot aspirated the second MPJ getting out of small amount of clear fluid and injected with a quarter cc of dexamethasone Kenalog and then injected the sinus tarsi with 3 mg Kenalog 5 mg Xylocaine applied metatarsal pad placed on Medrol Dosepak 6 days and reappoint one week. Advised on watching her sugar closer for the next several days

## 2014-09-18 NOTE — Progress Notes (Signed)
   Subjective:    Patient ID: [name] Hurley, female    DOB: 1970-10-01, 44 y.o.   MRN: [id number]  HPI Comments: "I have this weird feeling in my foot"  Patient c/o throbbing 1st and 2nd toe and forefoot right for about 1 year. Gotten worse. She states that it feels like foot is in a vice grip. Tried ice and takes meds for back.   Foot Pain      Review of Systems  All other systems reviewed and are negative.      Objective:   Physical Exam        Assessment & Plan:

## 2014-09-25 ENCOUNTER — Encounter: Payer: Self-pay | Admitting: Podiatry

## 2014-09-25 ENCOUNTER — Ambulatory Visit (INDEPENDENT_AMBULATORY_CARE_PROVIDER_SITE_OTHER): Payer: BC Managed Care – PPO | Admitting: Podiatry

## 2014-09-25 VITALS — BP 133/90 | HR 92 | Resp 12

## 2014-09-25 DIAGNOSIS — M2041 Other hammer toe(s) (acquired), right foot: Secondary | ICD-10-CM

## 2014-09-25 DIAGNOSIS — M779 Enthesopathy, unspecified: Secondary | ICD-10-CM | POA: Diagnosis not present

## 2014-09-25 DIAGNOSIS — M79671 Pain in right foot: Secondary | ICD-10-CM

## 2014-09-25 MED ORDER — DICLOFENAC SODIUM 75 MG PO TBEC
75.0000 mg | DELAYED_RELEASE_TABLET | Freq: Two times a day (BID) | ORAL | Status: DC
Start: 2014-09-25 — End: 2014-12-13

## 2014-09-26 NOTE — Progress Notes (Signed)
Subjective:     Patient ID: [name] Hurley, female   DOB: 1970-08-24, 44 y.o.   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 Reappoint to recheck

## 2014-10-01 ENCOUNTER — Other Ambulatory Visit: Payer: Self-pay | Admitting: Endocrinology

## 2014-10-02 ENCOUNTER — Other Ambulatory Visit: Payer: Self-pay | Admitting: *Deleted

## 2014-10-02 MED ORDER — LANTUS SOLOSTAR 100 UNIT/ML ~~LOC~~ SOPN: 22.0000 [IU] | PEN_INJECTOR | Freq: Every day | SUBCUTANEOUS | Status: DC

## 2014-10-13 ENCOUNTER — Ambulatory Visit: Payer: BC Managed Care – PPO | Admitting: Endocrinology

## 2014-10-17 ENCOUNTER — Ambulatory Visit (INDEPENDENT_AMBULATORY_CARE_PROVIDER_SITE_OTHER): Payer: BC Managed Care – PPO | Admitting: Family

## 2014-10-17 ENCOUNTER — Other Ambulatory Visit (INDEPENDENT_AMBULATORY_CARE_PROVIDER_SITE_OTHER): Payer: BC Managed Care – PPO

## 2014-10-17 ENCOUNTER — Encounter: Payer: Self-pay | Admitting: Family

## 2014-10-17 ENCOUNTER — Telehealth: Payer: Self-pay | Admitting: Family

## 2014-10-17 DIAGNOSIS — Z7689 Persons encountering health services in other specified circumstances: Secondary | ICD-10-CM

## 2014-10-17 DIAGNOSIS — R252 Cramp and spasm: Secondary | ICD-10-CM

## 2014-10-17 DIAGNOSIS — Z7189 Other specified counseling: Secondary | ICD-10-CM

## 2014-10-17 LAB — BASIC METABOLIC PANEL
BUN: 21 mg/dL (ref 6–23)
CALCIUM: 9.2 mg/dL (ref 8.4–10.5)
CHLORIDE: 108 meq/L (ref 96–112)
CO2: 24 meq/L (ref 19–32)
CREATININE: 1.22 mg/dL — AB (ref 0.40–1.20)
GFR: 61.08 mL/min (ref >60.00)
Glucose, Bld: 65 mg/dL — ABNORMAL LOW (ref 70–99)
Potassium: 5.1 mEq/L (ref 3.5–5.1)
Sodium: 136 mEq/L (ref 135–145)

## 2014-10-17 LAB — PHOSPHORUS: PHOSPHORUS: 3.7 mg/dL (ref 2.3–4.6)

## 2014-10-17 LAB — MAGNESIUM: MAGNESIUM: 1.8 mg/dL (ref 1.5–2.5)

## 2014-10-17 NOTE — Assessment & Plan Note (Signed)
Symptoms and exam consistent with muscle cramping. Obtain basic metabolic panel, magnesium, and phosphorus to rule out metabolic causes. Patient instructed to stretch multiple times throughout the day. Follow-up pending lab work or if symptoms worsen or fail to improve.

## 2014-10-17 NOTE — Telephone Encounter (Signed)
Please inform the patient that her lab work shows that her electrolytes are mostly normal. Her potassium is at the high end of normal, therefore please use caution with too many potassium containing foods. Her kidney function shows a slight elevation and decrease in function indicating potential dehydration. Please instruct her to drink plenty of water. These results could be the reason she is having muscle cramps, however cannot rule out the Invokana. Please continue to stretch and monitor at this time.

## 2014-10-17 NOTE — Patient Instructions (Addendum)
Thank you for choosing [facility].  Summary/Instructions:  Stretch periodically throughout the day as we discussed.  Please drink plenty of fluids.   Please stop by the lab on the basement level of the building for your blood work. Your results will be released to MyChart (or called to you) after review, usually within 72 hours after test completion. If any changes need to be made, you will be notified at that same time.  If your symptoms worsen or fail to improve, please contact our office for further instruction, or in case of emergency go directly to the emergency room at the closest medical facility.    Muscle Cramps and Spasms Muscle cramps and spasms occur when a muscle or muscles tighten and you have no control over this tightening (involuntary muscle contraction). They are a common problem and can develop in any muscle. The most common place is in the calf muscles of the leg. Both muscle cramps and muscle spasms are involuntary muscle contractions, but they also have differences:   Muscle cramps are sporadic and painful. They may last a few seconds to a quarter of an hour. Muscle cramps are often more forceful and last longer than muscle spasms.  Muscle spasms may or may not be painful. They may also last just a few seconds or much longer. CAUSES  It is uncommon for cramps or spasms to be due to a serious underlying problem. In many cases, the cause of cramps or spasms is unknown. Some common causes are:   Overexertion.   Overuse from repetitive motions (doing the same thing over and over).   Remaining in a certain position for a long period of time.   Improper preparation, form, or technique while performing a sport or activity.   Dehydration.   Injury.   Side effects of some medicines.   Abnormally low levels of the salts and ions in your blood (electrolytes), especially potassium and calcium. This could happen if you are taking water pills (diuretics) or  you are pregnant.  Some underlying medical problems can make it more likely to develop cramps or spasms. These include, but are not limited to:   Diabetes.   Parkinson disease.   Hormone disorders, such as thyroid problems.   Alcohol abuse.   Diseases specific to muscles, joints, and bones.   Blood vessel disease where not enough blood is getting to the muscles.  HOME CARE INSTRUCTIONS   Stay well hydrated. Drink enough water and fluids to keep your urine clear or pale yellow.  It may be helpful to massage, stretch, and relax the affected muscle.  For tight or tense muscles, use a warm towel, heating pad, or hot shower water directed to the affected area.  If you are sore or have pain after a cramp or spasm, applying ice to the affected area may relieve discomfort.  Put ice in a plastic bag.  Place a towel between your skin and the bag.  Leave the ice on for 15-20 minutes, 03-04 times a day.  Medicines used to treat a known cause of cramps or spasms may help reduce their frequency or severity. Only take over-the-counter or prescription medicines as directed by your caregiver. SEEK MEDICAL CARE IF:  Your cramps or spasms get more severe, more frequent, or do not improve over time.  MAKE SURE YOU:   Understand these instructions.  Will watch your condition.  Will get help right away if you are not doing well or get worse. Document Released: 01/03/2002  Document Revised: 11/08/2012 Document Reviewed: 06/30/2012 [facility] Patient Information 2015 [place], [place]. This information is not intended to replace advice given to you by your health care provider. Make sure you discuss any questions you have with your health care provider.

## 2014-10-17 NOTE — Progress Notes (Signed)
Pre visit review using our clinic review tool, if applicable. No additional management support is needed unless otherwise documented below in the visit note. 

## 2014-10-17 NOTE — Progress Notes (Signed)
Subjective:    Patient ID: [name] Hurley, female    DOB: 11-Oct-1970, 44 y.o.   MRN: [id number]  Chief Complaint  Patient presents with  . Establish Care    says that in her shin area of both legs she's cramping, when she's standing, sitting, or laying down    HPI:  [name] Hurley is a 44 y.o. female who presents today to establish care to establish care and discuss her shins.  1) Leg cramping - Associated symptom of leg cramping has been going on for about a year. Indicates the symptoms are happening more frequently in the past month. Indicates cramping occurs in both legs as far up as her calf. Timing of the symptoms is worse at night. Has taken an OTC BC powder and aspirin-PM that helps a little bit. Indicates that she drinks plenty of water. Drinks decaffinated tea, but no other caffeine. Notes that the cramping has begun during the day on occasion while walking. Pain is 0/10.   2) Referral to GYN - Patient requests referral to GYN.    No Known Allergies   Current Outpatient Prescriptions on File Prior to Visit  Medication Sig Dispense Refill  . diclofenac (VOLTAREN) 75 MG EC tablet Take 1 tablet (75 mg total) by mouth 2 (two) times daily. 50 tablet 2  . glucose blood (ONE TOUCH ULTRA TEST) test strip Use as instructed to check blood sugar 2 times per day dx code 250.02 100 each 2  . INVOKANA 100 MG TABS tablet TAKE ONE TABLET BY MOUTH ONCE DAILY BEFORE  BREAKFAST 30 tablet 3  . LANTUS SOLOSTAR 100 UNIT/ML Solostar Pen Inject 22 Units into the skin at bedtime. 15 mL 2  . lisinopril (PRINIVIL,ZESTRIL) 10 MG tablet Take 1 tablet (10 mg total) by mouth daily. 30 tablet 3  . metFORMIN (GLUCOPHAGE) 500 MG tablet TAKE TWO TABLETS BY MOUTH TWICE DAILY WITH MEALS 120 tablet 3  . pravastatin (PRAVACHOL) 10 MG tablet Take 1 tablet (10 mg total) by mouth daily. 30 tablet 3  . traMADol (ULTRAM) 50 MG tablet     . VICTOZA 18 MG/3ML SOPN INJECT 1.2 MG SUBCUTANEOUSLY DAILY AT THE SAME TIME EACH  DAY 6 pen 1   No current facility-administered medications on file prior to visit.    Past Medical History  Diagnosis Date  . Diabetes mellitus   . Sciatica   . Hypertension     Past Surgical History  Procedure Laterality Date  . Cesarean section      Family History  Problem Relation Age of Onset  . Hypertension Father   . Diabetes Maternal Aunt   . Heart disease Neg Hx   . Thyroid disease Neg Hx   . Hypertension Mother   . Hyperlipidemia Mother   . Healthy Maternal Grandmother   . Healthy Maternal Grandfather   . COPD Paternal Grandfather     Review of Systems  Constitutional: Negative for fever and chills.  Musculoskeletal:       Positive for calf cramps      Objective:    BP 130/82 mmHg  Pulse 93  Temp(Src) 98.1 F (36.7 C) (Oral)  Resp 18  Wt 174 lb (78.926 kg)  SpO2 97% Nursing note and vital signs reviewed.  Physical Exam  Constitutional: She is oriented to person, place, and time. She appears well-developed and well-nourished. No distress.  Cardiovascular: Normal rate, regular rhythm, normal heart sounds and intact distal pulses.   Pulmonary/Chest: Effort normal and breath sounds normal.  Musculoskeletal:  Bilateral calfs - no obvious deformity, discoloration, or edema noted. No palpable tenderness able to be elicited. Patient's was full range of motion and normal strength.  Neurological: She is alert and oriented to person, place, and time.  Skin: Skin is warm and dry.  Psychiatric: She has a normal mood and affect. Her behavior is normal. Judgment and thought content normal.       Assessment & Plan:

## 2014-10-18 ENCOUNTER — Encounter: Payer: Self-pay | Admitting: Endocrinology

## 2014-10-18 ENCOUNTER — Ambulatory Visit (INDEPENDENT_AMBULATORY_CARE_PROVIDER_SITE_OTHER): Payer: BC Managed Care – PPO | Admitting: Endocrinology

## 2014-10-18 ENCOUNTER — Telehealth: Payer: Self-pay | Admitting: Certified Nurse Midwife

## 2014-10-18 VITALS — BP 137/83 | HR 115 | Temp 97.7°F | Resp 16 | Ht 64.0 in | Wt 175.4 lb

## 2014-10-18 DIAGNOSIS — E1165 Type 2 diabetes mellitus with hyperglycemia: Secondary | ICD-10-CM | POA: Diagnosis not present

## 2014-10-18 DIAGNOSIS — I10 Essential (primary) hypertension: Secondary | ICD-10-CM

## 2014-10-18 DIAGNOSIS — E785 Hyperlipidemia, unspecified: Secondary | ICD-10-CM | POA: Diagnosis not present

## 2014-10-18 DIAGNOSIS — IMO0002 Reserved for concepts with insufficient information to code with codable children: Secondary | ICD-10-CM

## 2014-10-18 MED ORDER — LOSARTAN POTASSIUM 25 MG PO TABS: 25.0000 mg | ORAL_TABLET | Freq: Every day | ORAL | Status: DC

## 2014-10-18 NOTE — Progress Notes (Signed)
Patient ID: [name] Hurley, female   DOB: 07-13-1971, 44 y.o.   MRN: [id number]    Reason for Appointment: f/u for Type 2 Diabetes  History of Present Illness:          Diagnosis: Type 2 diabetes mellitus, date of diagnosis: 2000        Past history: She had symptoms of hyperglycemia or diagnoses and a glucose of 302.  She was initially treated with insulin doses tid for 2 yrs Subsequently she was treated with Glucophage and Avandamet In 2003 because of gestational diabetes and hyperglycemia she was treated with premixed insulin Afterwards she was continued on insulin and metformin; she thinks she has been taking Lantus for about 5 years Apparently her diabetes control has been very poor consistently but no records are available. She thinks her A1c was 10% in 2012 and also was high in 2010 She was on 25 units of Lantus without any mealtime insulin for quite some time and her A1c was 11.1% at her initial consultation in 10/2013  Recent history:      INSULIN regimen is described as:  Lantus 22 units hs   She is on a multiple drug regimen of 2 g metformin, Invokana 100 and Victoza along with her basal insulin of Lantus 22 units Her blood sugars in 2/16 were overall not as well controlled with A1c going up to 7.2%. She had not been checking her blood sugars much and is now able to check them periodically and has brought her monitor for download. Also previously was not taking her Invokana in the morning but after lunch and this was changed to before breakfast Current blood sugar patterns and problems:  Fasting blood sugars are excellent now with most readings below 100 without overnight hypoglycemia  She has done a few readings during the day but only 1 reading after supper; all these readings are low normal now  She also had lost weight and is tolerating Invokana  Has not resumed walking quite as yet  Her monitor has the incorrect time programmed     Oral hypoglycemic  drugs the patient is taking are: Metformin 1000 mg twice a day , Invokana 100 mg daily      Side effects from medications have been: None  Glucose monitoring:  1.1 times a day        Glucometer: One Touch ultra mini.      Blood Glucose readings     Hypoglycemia: Minimal with lowest glucose 64      Self-care: The diet that the patient has been following is low fat and avoiding drinks with sugar     Meals: 2-3 meals per day. Supper 7 pm         Exercise: She is starting to do a little walking Dietician visit: Most recent: At diagnosis and 2001.               Compliance with the medical regimen: fair Retinal exam: Most recent: 9/14, no known retinopathy   Microalbumin level: Normal in 2/16   Weight history: 180-220 pounds, highest after pregnancy  Wt Readings from Last 3 Encounters:  10/18/14 175 lb 6.4 oz (79.561 kg)  10/17/14 174 lb (78.926 kg)  09/01/14 181 lb 3.2 oz (82.192 kg)   Glycemic control:    Lab Results  Component Value Date   HGBA1C 7.2* 08/29/2014   HGBA1C 6.9* 06/01/2014   HGBA1C 7.4* 02/27/2014   Lab Results  Component Value Date   MICROALBUR 8.2*  08/29/2014   LDLCALC 65 06/01/2014   CREATININE 1.22* 10/17/2014      Medication List       This list is accurate as of: 10/18/14  4:10 PM.  Always use your most recent med list.               diclofenac 75 MG EC tablet  Commonly known as:  VOLTAREN  Take 1 tablet (75 mg total) by mouth 2 (two) times daily.     glucose blood test strip  Commonly known as:  ONE TOUCH ULTRA TEST  Use as instructed to check blood sugar 2 times per day dx code 250.02     INVOKANA 100 MG Tabs tablet  Generic drug:  canagliflozin  TAKE ONE TABLET BY MOUTH ONCE DAILY BEFORE  BREAKFAST     LANTUS SOLOSTAR 100 UNIT/ML Solostar Pen  Generic drug:  Insulin Glargine  Inject 22 Units into the skin at bedtime.     lisinopril 10 MG tablet  Commonly known as:  PRINIVIL,ZESTRIL  Take 1 tablet (10 mg total) by mouth daily.      metFORMIN 500 MG tablet  Commonly known as:  GLUCOPHAGE  TAKE TWO TABLETS BY MOUTH TWICE DAILY WITH MEALS     pravastatin 10 MG tablet  Commonly known as:  PRAVACHOL  Take 1 tablet (10 mg total) by mouth daily.     traMADol 50 MG tablet  Commonly known as:  ULTRAM     VICTOZA 18 MG/3ML Sopn  Generic drug:  Liraglutide  INJECT 1.2 MG SUBCUTANEOUSLY DAILY AT THE SAME TIME EACH DAY        Allergies: No Known Allergies  Past Medical History  Diagnosis Date  . Diabetes mellitus   . Sciatica   . Hypertension     Past Surgical History  Procedure Laterality Date  . Cesarean section      Family History  Problem Relation Age of Onset  . Hypertension Father   . Diabetes Maternal Aunt   . Heart disease Neg Hx   . Thyroid disease Neg Hx   . Hypertension Mother   . Hyperlipidemia Mother   . Healthy Maternal Grandmother   . Healthy Maternal Grandfather   . COPD Paternal Grandfather     Social History:  reports that she has been smoking Cigarettes.  She has a 22 pack-year smoking history. She has never used smokeless tobacco. She reports that she does not drink alcohol or use illicit drugs.    Review of Systems:.  HYPERTENSION: She had been started on lisinopril on the last visit but her potassium is high normal.  However has been given Voltaren also for foot pain  Lab Results  Component Value Date   CREATININE 1.22* 10/17/2014   BUN 21 10/17/2014   NA 136 10/17/2014   K 5.1 10/17/2014   CL 108 10/17/2014   CO2 24 10/17/2014         Lipids: She has been on low-dose pravastatin for several years, was told this is for cardiovascular prophylaxis LDL is controlled but has significantly low HDL.  Is taking fish oil twice a day also  Lab Results  Component Value Date   CHOL 117 06/01/2014   HDL 22.40* 06/01/2014   LDLCALC 65 06/01/2014   LDLDIRECT 87.4 02/27/2014   TRIG 149.0 06/01/2014   CHOLHDL 5 06/01/2014   No recent problems with numbness in her feet     EXAMINATION:   BP 137/83 mmHg  Pulse 115  Temp(Src) 97.7  F (36.5 C)  Resp 16  Ht  (1.626 m)  Wt 175 lb 6.4 oz (79.561 kg)  BMI 30.09 kg/m2  SpO2 95%    no pedal edema    ASSESSMENT/PLAN  Diabetes type 2 with obesity  Her blood sugars aresignificantly better now with taking her Invokana in the morning and also overall doing better with diet and exercise Blood sugars are not low normal especially later in the day and will need to reduce her Lantus  Appears to have good control of postprandial readings also with taking Victoza  Hypertension: Blood pressure appears relatively  high again She is having high normal potassium possibly with combining Invokana and lisinopril and will change to losartan low dose for now    Patient Instructions  Lantus 18  Please check blood sugars at least half the time about 2 hours after any meal and 3 times per week on waking up. Please bring blood sugar monitor to each visit. Recommended blood sugar levels about 2 hours after meal is 120-160 and on waking up 80-120      Esiquio Boesen 10/18/2014, 4:10 PM

## 2014-10-18 NOTE — Telephone Encounter (Signed)
Pt aware of results 

## 2014-10-18 NOTE — Patient Instructions (Signed)
Lantus 18  Please check blood sugars at least half the time about 2 hours after any meal and 3 times per week on waking up. Please bring blood sugar monitor to each visit. Recommended blood sugar levels about 2 hours after meal is 120-160 and on waking up 80-120

## 2014-10-18 NOTE — Telephone Encounter (Signed)
Called patient and left a message to call back to schedule a new patient doctor referral for an AEX.

## 2014-10-25 NOTE — Telephone Encounter (Signed)
Scheduled

## 2014-11-04 ENCOUNTER — Other Ambulatory Visit: Payer: Self-pay | Admitting: Endocrinology

## 2014-11-06 ENCOUNTER — Other Ambulatory Visit: Payer: Self-pay | Admitting: *Deleted

## 2014-11-06 MED ORDER — METFORMIN HCL 500 MG PO TABS: ORAL_TABLET | ORAL | Status: DC

## 2014-11-22 ENCOUNTER — Ambulatory Visit (INDEPENDENT_AMBULATORY_CARE_PROVIDER_SITE_OTHER): Payer: BC Managed Care – PPO | Admitting: Podiatry

## 2014-11-22 VITALS — BP 123/77 | HR 106 | Resp 15

## 2014-11-22 DIAGNOSIS — M779 Enthesopathy, unspecified: Secondary | ICD-10-CM | POA: Diagnosis not present

## 2014-11-22 DIAGNOSIS — M2041 Other hammer toe(s) (acquired), right foot: Secondary | ICD-10-CM

## 2014-11-22 NOTE — Patient Instructions (Signed)

## 2014-11-23 NOTE — Progress Notes (Signed)
Subjective:     Patient ID: [name] Hurley, female   DOB: [birth year], 44 y.o.   MRN: [id number]  HPI patient presents stating my right foot is quite a bit better with discomfort of a minimal nature   Review of Systems     Objective:   Physical Exam Neurovascular status intact muscle strength adequate with range of motion within normal limits and minimal discomfort noted right foot with diminished inflammation and fluid buildup    Assessment:     Improved capsulitis right with inflammation of a minimal nature upon palpation    Plan:     Advised on physical therapy anti-inflammatories and rigid bottom shoes. Patient picked up orthotics which fit well and will be seen back for reevaluation in approximately 1 month

## 2014-12-04 ENCOUNTER — Other Ambulatory Visit: Payer: Self-pay | Admitting: Endocrinology

## 2014-12-08 ENCOUNTER — Other Ambulatory Visit (INDEPENDENT_AMBULATORY_CARE_PROVIDER_SITE_OTHER): Payer: BC Managed Care – PPO

## 2014-12-08 DIAGNOSIS — E1165 Type 2 diabetes mellitus with hyperglycemia: Secondary | ICD-10-CM

## 2014-12-08 DIAGNOSIS — IMO0002 Reserved for concepts with insufficient information to code with codable children: Secondary | ICD-10-CM

## 2014-12-08 LAB — LIPID PANEL
CHOL/HDL RATIO: 4
Cholesterol: 122 mg/dL (ref 0–200)
HDL: 28 mg/dL — ABNORMAL LOW (ref >39.00)
LDL Cholesterol: 79 mg/dL (ref 0–99)
NONHDL: 94
Triglycerides: 74 mg/dL (ref 0.0–149.0)
VLDL: 14.8 mg/dL (ref 0.0–40.0)

## 2014-12-08 LAB — COMPREHENSIVE METABOLIC PANEL
ALT: 8 U/L (ref 0–35)
AST: 15 U/L (ref 0–37)
Albumin: 3.8 g/dL (ref 3.5–5.2)
Alkaline Phosphatase: 59 U/L (ref 39–117)
BILIRUBIN TOTAL: 0.2 mg/dL (ref 0.2–1.2)
BUN: 15 mg/dL (ref 6–23)
CALCIUM: 9.4 mg/dL (ref 8.4–10.5)
CHLORIDE: 105 meq/L (ref 96–112)
CO2: 22 meq/L (ref 19–32)
CREATININE: 1.02 mg/dL (ref 0.40–1.20)
GFR: 75.06 mL/min (ref >60.00)
GLUCOSE: 104 mg/dL — AB (ref 70–99)
Potassium: 4.4 mEq/L (ref 3.5–5.1)
Sodium: 134 mEq/L — ABNORMAL LOW (ref 135–145)
Total Protein: 7.1 g/dL (ref 6.0–8.3)

## 2014-12-08 LAB — BASIC METABOLIC PANEL
BUN: 15 mg/dL (ref 6–23)
CO2: 22 meq/L (ref 19–32)
Calcium: 9.4 mg/dL (ref 8.4–10.5)
Chloride: 105 mEq/L (ref 96–112)
Creatinine, Ser: 1.02 mg/dL (ref 0.40–1.20)
GFR: 75.06 mL/min (ref >60.00)
GLUCOSE: 104 mg/dL — AB (ref 70–99)
POTASSIUM: 4.4 meq/L (ref 3.5–5.1)
Sodium: 134 mEq/L — ABNORMAL LOW (ref 135–145)

## 2014-12-08 LAB — HEMOGLOBIN A1C: Hgb A1c MFr Bld: 6.7 % — ABNORMAL HIGH (ref 4.6–6.5)

## 2014-12-13 ENCOUNTER — Ambulatory Visit (INDEPENDENT_AMBULATORY_CARE_PROVIDER_SITE_OTHER): Payer: BC Managed Care – PPO | Admitting: Endocrinology

## 2014-12-13 ENCOUNTER — Encounter: Payer: Self-pay | Admitting: Endocrinology

## 2014-12-13 VITALS — BP 138/76 | HR 109 | Temp 97.5°F | Resp 16 | Ht 64.0 in | Wt 173.2 lb

## 2014-12-13 DIAGNOSIS — E785 Hyperlipidemia, unspecified: Secondary | ICD-10-CM

## 2014-12-13 DIAGNOSIS — E119 Type 2 diabetes mellitus without complications: Secondary | ICD-10-CM

## 2014-12-13 NOTE — Progress Notes (Signed)
Patient ID: [name] Hurley, female   DOB: 01-Aug-1970, 44 y.o.   MRN: [id number]    Reason for Appointment: f/u for Type 2 Diabetes  History of Present Illness:          Diagnosis: Type 2 diabetes mellitus, date of diagnosis: 2000        Past history: She had symptoms of hyperglycemia or diagnoses and a glucose of 302.  She was initially treated with insulin doses tid for 2 yrs Subsequently she was treated with Glucophage and Avandamet In 2003 because of gestational diabetes and hyperglycemia she was treated with premixed insulin Afterwards she was continued on insulin and metformin; she thinks she has been taking Lantus for about 5 years Apparently her diabetes control has been very poor consistently but no records are available. She thinks her A1c was 10% in 2012 and also was high in 2010 She was on 25 units of Lantus without any mealtime insulin for quite some time and her A1c was 11.1% at her initial consultation in 10/2013  Recent history:      INSULIN regimen is described as:  Lantus 18-22 units hs   She is on a multiple drug regimen of 2 g metformin, Invokana 100 and Victoza along with her basal insulin of Lantus 22 units With changing her Invokana to the morning before breakfast her blood sugars appear to be better and her A1c is down to 6.7 She was advised to reduce her Lantus because of low normal fasting readings but she is still taking mostly 22 units unless blood sugar is below 90  Current blood sugar patterns and problems:  She did not bring her monitor for download today and not clear what her blood sugar patterns are  She is doing blood sugars only in the mornings and she thinks that they are consistently below 100  She has maintained her weight  His just starting to do her walking since she has no further foot pain   Oral hypoglycemic drugs the patient is taking are: Metformin 1000 mg twice a day , Invokana 100 mg daily      Side effects from medications  have been: None  Glucose monitoring:  1.1 times a day        Glucometer: One Touch ultra mini.      Blood Glucose readings  By recall: Mostly 80-90+ in the morning, no readings after meals   Hypoglycemia: Minimal      Self-care: The diet that the patient has been following is low fat and avoiding drinks with sugar     Meals: 2-3 meals per day. Supper 7 pm         Exercise: She is starting to do a little walking Dietician visit: Most recent: At diagnosis and 2001.               Compliance with the medical regimen: fair Retinal exam: Most recent: 9/14, no known retinopathy   Microalbumin level: Normal in 2/16   Weight history: 180-220 pounds, highest after pregnancy  Wt Readings from Last 3 Encounters:  12/13/14 173 lb 3.2 oz (78.563 kg)  10/18/14 175 lb 6.4 oz (79.561 kg)  10/17/14 174 lb (78.926 kg)   Glycemic control:    Lab Results  Component Value Date   HGBA1C 6.7* 12/08/2014   HGBA1C 7.2* 08/29/2014   HGBA1C 6.9* 06/01/2014   Lab Results  Component Value Date   MICROALBUR 8.2* 08/29/2014   LDLCALC 79 12/08/2014   CREATININE 1.02 12/08/2014  CREATININE 1.02 12/08/2014      Medication List       This list is accurate as of: 12/13/14 11:59 PM.  Always use your most recent med list.               glucose blood test strip  Commonly known as:  ONE TOUCH ULTRA TEST  Use as instructed to check blood sugar 2 times per day dx code 250.02     INVOKANA 100 MG Tabs tablet  Generic drug:  canagliflozin  TAKE ONE TABLET BY MOUTH ONCE DAILY BEFORE BREAKFAST     LANTUS SOLOSTAR 100 UNIT/ML Solostar Pen  Generic drug:  Insulin Glargine  Inject 22 Units into the skin at bedtime.     losartan 25 MG tablet  Commonly known as:  COZAAR  Take 1 tablet (25 mg total) by mouth daily.     metFORMIN 500 MG tablet  Commonly known as:  GLUCOPHAGE  TAKE TWO TABLETS BY MOUTH TWICE DAILY WITH MEALS     pravastatin 10 MG tablet  Commonly known as:  PRAVACHOL  Take 1 tablet  (10 mg total) by mouth daily.     traMADol 50 MG tablet  Commonly known as:  ULTRAM     VICTOZA 18 MG/3ML Sopn  Generic drug:  Liraglutide  INJECT 1.2 MG SUBCUTANEOUSLY DAILY AT THE SAME TIME EACH DAY        Allergies: No Known Allergies  Past Medical History  Diagnosis Date  . Diabetes mellitus   . Sciatica   . Hypertension     Past Surgical History  Procedure Laterality Date  . Cesarean section      Family History  Problem Relation Age of Onset  . Hypertension Father   . Diabetes Maternal Aunt   . Heart disease Neg Hx   . Thyroid disease Neg Hx   . Hypertension Mother   . Hyperlipidemia Mother   . Healthy Maternal Grandmother   . Healthy Maternal Grandfather   . COPD Paternal Grandfather     Social History:  reports that she has been smoking Cigarettes.  She has a 22 pack-year smoking history. She has never used smokeless tobacco. She reports that she does not drink alcohol or use illicit drugs.    Review of Systems:.  HYPERTENSION: She had been started on  lisinopril on the last visit but her potassium is high normal.   However had been given Voltaren also for foot pain  Lab Results  Component Value Date   CREATININE 1.02 12/08/2014   CREATININE 1.02 12/08/2014   BUN 15 12/08/2014   BUN 15 12/08/2014   NA 134* 12/08/2014   NA 134* 12/08/2014   K 4.4 12/08/2014   K 4.4 12/08/2014   CL 105 12/08/2014   CL 105 12/08/2014   CO2 22 12/08/2014   CO2 22 12/08/2014         Lipids: She has been on low-dose pravastatin for several years, was told this is for cardiovascular prophylaxis LDL is controlled but has significantly low HDL.  Is taking fish oil twice a day also  Lab Results  Component Value Date   CHOL 122 12/08/2014   HDL 28.00* 12/08/2014   LDLCALC 79 12/08/2014   LDLDIRECT 87.4 02/27/2014   TRIG 74.0 12/08/2014   CHOLHDL 4 12/08/2014   No recent problems with numbness in her feet    EXAMINATION:   BP 138/76 mmHg  Pulse 109   Temp(Src) 97.5 F (36.4 C)  Resp 16  Ht 5\' 4"  (1.626 m)  Wt 173 lb 3.2 oz (78.563 kg)  BMI 29.72 kg/m2  SpO2 96%    no pedal edema    ASSESSMENT/PLAN  Diabetes type 2 with obesity  Her blood sugars are  controlled now with taking her Invokana in the morning and also overall doing better with diet  She is just starting to do her walking and hopefully will lose some weight also Blood sugars are tending to be low normal and she can reduce the Lantus at least 2 units for now and take a steady dose of 20 units. Discussed adjustment of Lantus based on fasting blood sugar every 3-4 days Needs to do more readings after meals also and bring monitor for download on each visit Appears to have good control overall and she will continue Victoza as before also  Hypertension: Blood pressure appears well controlled and potassium is stable She has only mild hypertension and is taking only 25 mg losartan, mostly prophylactically and will continue to monitor   Patient Instructions  Stay on 20 units Lantus daily; if am sugar stays under 90 all week then go to 18     [facility] CampusKUMAR,[name] Hurley 12/14/2014, 8:18 AM   Note: This office note was prepared with Dragon voice recognition system technology. Any transcriptional errors that result from this process are unintentional.

## 2014-12-13 NOTE — Patient Instructions (Addendum)
Stay on 20 units Lantus daily; if am sugar stays under 90 all week then go to 18

## 2014-12-20 ENCOUNTER — Encounter: Payer: Self-pay | Admitting: Certified Nurse Midwife

## 2014-12-20 ENCOUNTER — Ambulatory Visit (INDEPENDENT_AMBULATORY_CARE_PROVIDER_SITE_OTHER): Payer: BC Managed Care – PPO | Admitting: Certified Nurse Midwife

## 2014-12-20 VITALS — BP 120/70 | HR 72 | Resp 20 | Ht 65.25 in | Wt 174.0 lb

## 2014-12-20 DIAGNOSIS — Z01419 Encounter for gynecological examination (general) (routine) without abnormal findings: Secondary | ICD-10-CM | POA: Diagnosis not present

## 2014-12-20 DIAGNOSIS — Z30433 Encounter for removal and reinsertion of intrauterine contraceptive device: Secondary | ICD-10-CM | POA: Diagnosis not present

## 2014-12-20 DIAGNOSIS — Z1211 Encounter for screening for malignant neoplasm of colon: Secondary | ICD-10-CM

## 2014-12-20 DIAGNOSIS — Z124 Encounter for screening for malignant neoplasm of cervix: Secondary | ICD-10-CM

## 2014-12-20 NOTE — Progress Notes (Signed)
44 y.o. G64P1001 Married  African American Fe here to establish gyn care and for annual exam. Periods regular and normal. Contraception Paragard IUD due for removal in 2015 Would like removal and reinsertion. Has not been using backup contraception, was not aware needed. Sees PCP with [facility] with recent exam and all labs. Sees Dr. Lucianne Muss for diabetes type 1, stable medication now. Patient has lost 36 pounds, since diagnosis. No other health issues today.  Patient's last menstrual period was 12/13/2014.          Sexually active: Yes.    The current method of family planning is IUD. Past due to come out   Due for removal in 2015. Exercising: Yes.    walking Smoker:  yes  Health Maintenance: Pap:  02-11-11 neg HPV HR neg MMG:  2012 birads 1: neg Colonoscopy:  none BMD:   none TDaP:  Needs up to date Labs: pcp Self breast exam: done monthly   reports that she has been smoking Cigarettes.  She has a 22 pack-year smoking history. She has never used smokeless tobacco. She reports that she does not drink alcohol or use illicit drugs.  Past Medical History  Diagnosis Date  . Diabetes mellitus   . Sciatica   . Hypertension   . Dyspareunia     Past Surgical History  Procedure Laterality Date  . Cesarean section      Current Outpatient Prescriptions  Medication Sig Dispense Refill  . glucose blood (ONE TOUCH ULTRA TEST) test strip Use as instructed to check blood sugar 2 times per day dx code 250.02 100 each 2  . INVOKANA 100 MG TABS tablet TAKE ONE TABLET BY MOUTH ONCE DAILY BEFORE BREAKFAST 30 tablet 0  . LANTUS SOLOSTAR 100 UNIT/ML Solostar Pen Inject 22 Units into the skin at bedtime. 15 mL 2  . losartan (COZAAR) 25 MG tablet Take 1 tablet (25 mg total) by mouth daily. 30 tablet 3  . metFORMIN (GLUCOPHAGE) 500 MG tablet TAKE TWO TABLETS BY MOUTH TWICE DAILY WITH MEALS 120 tablet 2  . pravastatin (PRAVACHOL) 10 MG tablet Take 1 tablet (10 mg total) by mouth daily. 30  tablet 3  . traMADol (ULTRAM) 50 MG tablet as needed.     Marland Kitchen VICTOZA 18 MG/3ML SOPN INJECT 1.2 MG SUBCUTANEOUSLY DAILY AT THE SAME TIME EACH DAY 6 pen 1   No current facility-administered medications for this visit.    Family History  Problem Relation Age of Onset  . Hypertension Father   . Diabetes Maternal Aunt   . Heart disease Neg Hx   . Thyroid disease Neg Hx   . Hypertension Mother   . Hyperlipidemia Mother   . Healthy Maternal Grandmother   . Healthy Maternal Grandfather   . COPD Paternal Grandfather     ROS:  Pertinent items are noted in HPI.  Otherwise, a comprehensive ROS was negative.  Exam:   BP 120/70 mmHg  Pulse 72  Resp 20  Ht 5' 5.25" (1.657 m)  Wt 174 lb (78.926 kg)  BMI 28.75 kg/m2  LMP 12/13/2014 Height: 5' 5.25" (165.7 cm) Ht Readings from Last 3 Encounters:  12/20/14 5' 5.25" (1.657 m)  12/13/14  (1.626 m)  10/18/14  (1.626 m)    General appearance: alert, cooperative and appears stated age Head: Normocephalic, without obvious abnormality, atraumatic Neck: no adenopathy, supple, symmetrical, trachea midline and thyroid normal to inspection and palpation Lungs: clear to auscultation bilaterally Breasts: normal appearance, no masses  or tenderness, No nipple retraction or dimpling, No nipple discharge or bleeding, No axillary or supraclavicular adenopathy Heart: regular rate and rhythm Abdomen: soft, non-tender; no masses,  no organomegaly Extremities: extremities normal, atraumatic, no cyanosis or edema Skin: Skin color, texture, turgor normal. No rashes or lesions Lymph nodes: Cervical, supraclavicular, and axillary nodes normal. No abnormal inguinal nodes palpated Neurologic: Grossly normal   Pelvic: External genitalia:  no lesions              Urethra:  normal appearing urethra with no masses, tenderness or lesions              Bartholin's and Skene's: normal                 Vagina: normal appearing vagina with normal color and  discharge, no lesions              Cervix: normal appearance, no lesions non tender, IUD noted in cervix              Pap taken: Yes.   Bimanual Exam:  Uterus:  normal size, contour, position, consistency, mobility, non-tender              Adnexa: normal adnexa and no mass, fullness, tenderness               Rectovaginal: Confirms               Anus:  normal sphincter tone, no lesions  Chaperone present: Yes  A:  Well Woman with normal exam  Contraception Paragard IUD needs removal and replacement with new Paragard IUD  Type 1 Diabetic good control with Endocrine  Hypertension/cholesterol with PCP management  Family history of colon cancer mother age 46 needs referral for colonoscopy  Mammogram due  Immunization due     P:   Reviewed health and wellness pertinent to exam  Discussed IUD string visualized and patient needs to use condoms until new IUD inserted due to removal was to be in 2015Patient agreeable. Discussed will need to be inserted on her period and UPT will be done prior to procedure. Patient will be called with insurance information and will be scheduled when she calls on first day of menses. Patient voiced understanding.   Continue follow up with MD as indicated  Discussed due colon cancer history with mother should have colonoscopy Patient requests referral for this. Referral will be done and patient contacted with information.  Will do TDAP at IUD insertion  Pap smear taken with HPVHR  counseled on breast self exam, mammography screening and now due and need to schedule, given information sheet to do this, adequate intake of calcium and vitamin D, diet and exercise also discussed.  return annually or prn  An After Visit Summary was printed and given to the patient.

## 2014-12-20 NOTE — Patient Instructions (Signed)

## 2014-12-20 NOTE — Progress Notes (Signed)
Reviewed personally.  M. Suzanne Tayja Manzer, MD.  

## 2014-12-22 ENCOUNTER — Telehealth: Payer: Self-pay | Admitting: Certified Nurse Midwife

## 2014-12-22 NOTE — Telephone Encounter (Signed)
Call to patient. Advised of benefit quote received for IUD removal/reinsertion. Patient agreeable. Patient is to call with cycle.

## 2014-12-26 ENCOUNTER — Other Ambulatory Visit: Payer: Self-pay | Admitting: Endocrinology

## 2014-12-26 LAB — IPS PAP TEST WITH HPV

## 2015-01-09 ENCOUNTER — Other Ambulatory Visit: Payer: Self-pay | Admitting: Endocrinology

## 2015-02-09 ENCOUNTER — Telehealth: Payer: Self-pay | Admitting: Certified Nurse Midwife

## 2015-02-09 NOTE — Telephone Encounter (Signed)
Spoke with patient. Patient states that she started her cycle yesterday 7/14 and would like to schedule IUD removal and reinsertion. Appointment scheduled for 02/13/2015 at 2pm with Verner Choleborah S. Leonard CNM. Pre procedure instructions given.  Motrin instructions given. Motrin=Advil=Ibuprofen, 800 mg one hour before appointment. Eat a meal and hydrate well before appointment. Patient is agreeable and verbalizes understanding.  Routing to provider for final review. Patient agreeable to disposition. Will close encounter.   Patient aware provider will review message and nurse will return call if any additional advice or change of disposition.

## 2015-02-09 NOTE — Telephone Encounter (Signed)
Patient wants to schedule an appointment to have her IUD removed. °

## 2015-02-13 ENCOUNTER — Ambulatory Visit (INDEPENDENT_AMBULATORY_CARE_PROVIDER_SITE_OTHER): Payer: BC Managed Care – PPO | Admitting: Certified Nurse Midwife

## 2015-02-13 ENCOUNTER — Encounter: Payer: Self-pay | Admitting: Certified Nurse Midwife

## 2015-02-13 VITALS — BP 122/70 | HR 100 | Resp 18 | Wt 174.0 lb

## 2015-02-13 DIAGNOSIS — B373 Candidiasis of vulva and vagina: Secondary | ICD-10-CM | POA: Diagnosis not present

## 2015-02-13 DIAGNOSIS — Z30433 Encounter for removal and reinsertion of intrauterine contraceptive device: Secondary | ICD-10-CM | POA: Diagnosis not present

## 2015-02-13 DIAGNOSIS — Z01812 Encounter for preprocedural laboratory examination: Secondary | ICD-10-CM | POA: Diagnosis not present

## 2015-02-13 DIAGNOSIS — B3731 Acute candidiasis of vulva and vagina: Secondary | ICD-10-CM

## 2015-02-13 LAB — POCT URINE PREGNANCY: Preg Test, Ur: NEGATIVE

## 2015-02-13 MED ORDER — NYSTATIN 100000 UNIT/GM EX CREA: 1.0000 "application " | TOPICAL_CREAM | Freq: Two times a day (BID) | CUTANEOUS | Status: DC

## 2015-02-13 NOTE — Patient Instructions (Signed)

## 2015-02-13 NOTE — Progress Notes (Signed)
44 yrsMarriedAfrican Americanfemale presents for ParaGard removal and insertion of new ParaGard IUD. Denies any vaginal symptoms or STD concerns. LMP7-14-2016  Information read regarding Mirena/Skyla/Paragard  IUD, consent signed. Questions addressed.  Request same as above.  Healthy female,Normal affect, oriented x 3 normal menses, no abnormal bleeding, pelvic pain or discharge,   Abdomen: soft, non-tender Groinno inguinal nodes palpated  Pelvic exam: Vulva: redness noted under clitoral hood wet prep taken  Vagina:normal vagina and blood present, wet prep taken  Cervix:Non-tender, Negative CMT, no lesions or redness, nulliparous/parous os  Uterus:normal shape,  and consistency, mid position   Lab:no pathogens on vaginal wet prep, positive yeast on vulva wet prep  Procedure:  Speculum inserted into vagina. Cervix visualized. ParaGard IUD string visualized and grasped with ring forceps.  With gentle traction IUD removed intact, without difficulty, and shown to patient. Cervix cleansed with betadine solution X 3. Tenaculum placed on cervix at 10 o'clock position(s). Unable to sound uterus due position. Dr. Oscar LaJertson consulted.  Uterus sounded to 9 centimeters per Dr. Chesley NoonJertson  ParaGard IUD removed from sterile packet and under sterile conditions inserted with out difficulty.  IUD string trimmed to 3 centimeters.  Remainder string given to patient to feel for identification.  Tenaculum removed. no bleeding noted.  Speculum removed.  Uterus palpated normal.  Patient tolerated procedure well.    A: ParaGard IUD removal and insertion of new ParaGard  Yeast vulvitis  P: Instructions and warnings signs given.     IUD identification card given with IUD removal7-19-2026     Return visit one month or after first menses   Discussed finding of yeast vulvitis and need for treatment. Rx Nystatin cream see order with instructions to use for 7 days.  Rv prn     Attending addendum:  As above,  called to room to assist with IUD insertion. The patient was in position with a tenaculum on her cervix (already cleansed with betadine). The mini-dilators were used to find the endocervix and uterine cavity, easily passed. Dilated to a #4 hagar dilator. The paragaurd was inserted without difficulty. Strings cut to 3 cm. The tenaculum and speculum were removed. The patient tolerated the procedure well.  Gertie ExonJill Jertson, MD

## 2015-03-01 ENCOUNTER — Encounter: Payer: Self-pay | Admitting: Endocrinology

## 2015-03-04 ENCOUNTER — Other Ambulatory Visit: Payer: Self-pay | Admitting: Endocrinology

## 2015-03-06 ENCOUNTER — Other Ambulatory Visit: Payer: BC Managed Care – PPO

## 2015-03-09 ENCOUNTER — Ambulatory Visit: Payer: BC Managed Care – PPO | Admitting: Endocrinology

## 2015-03-16 ENCOUNTER — Encounter: Payer: Self-pay | Admitting: Certified Nurse Midwife

## 2015-03-16 ENCOUNTER — Ambulatory Visit (INDEPENDENT_AMBULATORY_CARE_PROVIDER_SITE_OTHER): Payer: BC Managed Care – PPO | Admitting: Certified Nurse Midwife

## 2015-03-16 VITALS — BP 118/68 | HR 68 | Resp 16 | Ht 65.25 in | Wt 152.0 lb

## 2015-03-16 DIAGNOSIS — Z3040 Encounter for surveillance of contraceptives, unspecified: Secondary | ICD-10-CM

## 2015-03-16 NOTE — Progress Notes (Signed)
44 y.o. Married [place] American female G1P1001 here for follow up of Mirena IUD inserted  initiated on 02/13/15. No bleeding since insertion. Denies pelvic pain or problems. Happy with choice. All symptoms of yeast vulvitis have also resolved treated with Mycolog cream. Completed all medication as directed.  Denies any symptoms of itching or burning or increase vaginal discharge. Getting ready for new school year and also the trials with her 2 children who have learning disabilities. No health issues today.   O: Healthy WD,WN female Affect: normal Skin:warm and dry Abdomen:soft, non tender, no masses Pelvic exam:EXTERNAL GENITALIA: normal appearing vulva with no masses, tenderness or lesions VAGINA: no abnormal discharge or lesions CERVIX: no lesions or cervical motion tenderness and scant blood noted from cervix, IUD string noted appropriate length, not trimmed UTERUS: normal shape, non tender, retroverted ADNEXA: no masses palpable and nontender  A:Normal IUD surveillance Yeast vulvitis resolved  P: Discussed findings of IUD in place and reviewed warning signs that she would need to advise for. Discussed no appearance of yeast externally. No wet prep taken. Still has some Mycolog left if needed for prn use.  Rv prn, aex   Labs   Instructions given regarding:  RV

## 2015-03-16 NOTE — Progress Notes (Signed)
Reviewed personally.  M. Suzanne Delvecchio Madole, MD.  

## 2015-04-11 ENCOUNTER — Other Ambulatory Visit: Payer: Self-pay | Admitting: Endocrinology

## 2015-04-25 ENCOUNTER — Encounter: Payer: Self-pay | Admitting: Family

## 2015-04-25 ENCOUNTER — Ambulatory Visit (INDEPENDENT_AMBULATORY_CARE_PROVIDER_SITE_OTHER): Payer: BC Managed Care – PPO | Admitting: Family

## 2015-04-25 VITALS — BP 144/82 | HR 90 | Temp 98.1°F | Resp 18 | Ht 65.0 in | Wt 181.0 lb

## 2015-04-25 DIAGNOSIS — Z23 Encounter for immunization: Secondary | ICD-10-CM | POA: Diagnosis not present

## 2015-04-25 DIAGNOSIS — R05 Cough: Secondary | ICD-10-CM | POA: Diagnosis not present

## 2015-04-25 DIAGNOSIS — R059 Cough, unspecified: Secondary | ICD-10-CM

## 2015-04-25 MED ORDER — PROMETHAZINE-DM 6.25-15 MG/5ML PO SYRP: 5.0000 mL | ORAL_SOLUTION | Freq: Four times a day (QID) | ORAL | Status: DC | PRN

## 2015-04-25 MED ORDER — LEVOFLOXACIN 500 MG PO TABS: 500.0000 mg | ORAL_TABLET | Freq: Every day | ORAL | Status: DC

## 2015-04-25 MED ORDER — FLUTICASONE FUROATE-VILANTEROL 100-25 MCG/INH IN AEPB: 1.0000 | INHALATION_SPRAY | Freq: Every day | RESPIRATORY_TRACT | Status: DC | PRN

## 2015-04-25 NOTE — Progress Notes (Signed)
Pre visit review using our clinic review tool, if applicable. No additional management support is needed unless otherwise documented below in the visit note. 

## 2015-04-25 NOTE — Progress Notes (Signed)
Subjective:    Patient ID: [name] Hurley, female    DOB: 1970/08/29, 44 y.o.   MRN: [id number]  Chief Complaint  Patient presents with  . Nasal Congestion    x2 weeks, wheezing, SOB, right ear pain, cough, congestion that will not break up    HPI:  [name] Hurley is a 44 y.o. female who  has a past medical history of Diabetes mellitus; Sciatica; Hypertension; and Dyspareunia. and presents today for an acute office visit.   Associated symptoms of wheezing, SOB, right ear pain, non-productive, and congestion that has been going on for 2 weeks. Modifying factors include over the counter decongestants which have not helped with her symptoms. Course of the illness has been about the same since onset. Denies any recent antibiotics.    No Known Allergies   Current Outpatient Prescriptions on File Prior to Visit  Medication Sig Dispense Refill  . glucose blood (ONE TOUCH ULTRA TEST) test strip Use as instructed to check blood sugar 2 times per day dx code 250.02 100 each 2  . INVOKANA 100 MG TABS tablet TAKE ONE TABLET BY MOUTH ONCE DAILY BEFORE BREAKFAST 30 tablet 3  . LANTUS SOLOSTAR 100 UNIT/ML Solostar Pen Inject 22 Units into the skin at bedtime. 15 mL 2  . losartan (COZAAR) 25 MG tablet TAKE ONE TABLET BY MOUTH ONCE DAILY 30 tablet 0  . metFORMIN (GLUCOPHAGE) 500 MG tablet TAKE TWO TABLETS BY MOUTH TWICE DAILY WITH MEALS 120 tablet 3  . pravastatin (PRAVACHOL) 10 MG tablet TAKE ONE TABLET BY MOUTH ONCE DAILY 30 tablet 3  . traMADol (ULTRAM) 50 MG tablet as needed.     Marland Kitchen VICTOZA 18 MG/3ML SOPN INJECT 1.2MG  SUBCUTANEOUSLY DAILY AT THE SAME TIME EACH DAY 18 mL 1   No current facility-administered medications on file prior to visit.     Review of Systems  Constitutional: Positive for chills. Negative for fever.  HENT: Positive for congestion and ear pain. Negative for sinus pressure and sore throat.   Respiratory: Positive for cough, chest tightness and shortness of breath.     Neurological: Negative for headaches.      Objective:    BP 144/82 mmHg  Pulse 90  Temp(Src) 98.1 F (36.7 C) (Oral)  Resp 18  Ht  (1.651 m)  Wt 181 lb (82.101 kg)  BMI 30.12 kg/m2  SpO2 98% Nursing note and vital signs reviewed.  Physical Exam  Constitutional: She is oriented to person, place, and time. She appears well-developed and well-nourished. No distress.  HENT:  Right Ear: Hearing, tympanic membrane, external ear and ear canal normal.  Left Ear: Hearing, tympanic membrane, external ear and ear canal normal.  Nose: Nose normal. Right sinus exhibits no maxillary sinus tenderness and no frontal sinus tenderness. Left sinus exhibits no maxillary sinus tenderness and no frontal sinus tenderness.  Mouth/Throat: Uvula is midline, oropharynx is clear and moist and mucous membranes are normal.  Cardiovascular: Normal rate, regular rhythm, normal heart sounds and intact distal pulses.   Pulmonary/Chest: Effort normal. She has wheezes.  Neurological: She is alert and oriented to person, place, and time.  Skin: Skin is warm and dry.  Psychiatric: She has a normal mood and affect. Her behavior is normal. Judgment and thought content normal.       Assessment & Plan:   Problem List Items Addressed This Visit      Other   Cough - Primary    Symptoms and exam consistent with bronchitis. Given potential  for decreased immune function secondary to diabetes, start levofloxacin. Start phenergan-DM as needed for cough and sleep. Breo sample provided for wheezing and cough. Continue over the counter medications as needed for symptom relief and supportive care. Follow up if symptoms worsen or fail to improve.       Relevant Medications   levofloxacin (LEVAQUIN) 500 MG tablet   promethazine-dextromethorphan (PROMETHAZINE-DM) 6.25-15 MG/5ML syrup   Fluticasone Furoate-Vilanterol (BREO ELLIPTA) 100-25 MCG/INH AEPB   Other Relevant Orders   Flu Vaccine QUAD 36+ mos IM    Other Visit  Diagnoses    Encounter for immunization

## 2015-04-25 NOTE — Patient Instructions (Signed)
Thank you for choosing [facility].  Summary/Instructions:  Your prescription(s) have been submitted to your pharmacy or been printed and provided for you. Please take as directed and contact our office if you believe you are having problem(s) with the medication(s) or have any questions.  If your symptoms worsen or fail to improve, please contact our office for further instruction, or in case of emergency go directly to the emergency room at the closest medical facility.    Acute Bronchitis Bronchitis is inflammation of the airways that extend from the windpipe into the lungs (bronchi). The inflammation often causes mucus to develop. This leads to a cough, which is the most common symptom of bronchitis.  In acute bronchitis, the condition usually develops suddenly and goes away over time, usually in a couple weeks. Smoking, allergies, and asthma can make bronchitis worse. Repeated episodes of bronchitis may cause further lung problems.  CAUSES Acute bronchitis is most often caused by the same virus that causes a cold. The virus can spread from person to person (contagious) through coughing, sneezing, and touching contaminated objects. SIGNS AND SYMPTOMS   Cough.   Fever.   Coughing up mucus.   Body aches.   Chest congestion.   Chills.   Shortness of breath.   Sore throat.  DIAGNOSIS  Acute bronchitis is usually diagnosed through a physical exam. Your health care provider will also ask you questions about your medical history. Tests, such as chest X-rays, are sometimes done to rule out other conditions.  TREATMENT  Acute bronchitis usually goes away in a couple weeks. Oftentimes, no medical treatment is necessary. Medicines are sometimes given for relief of fever or cough. Antibiotic medicines are usually not needed but may be prescribed in certain situations. In some cases, an inhaler may be recommended to help reduce shortness of breath and control the cough. A cool  mist vaporizer may also be used to help thin bronchial secretions and make it easier to clear the chest.  HOME CARE INSTRUCTIONS  Get plenty of rest.   Drink enough fluids to keep your urine clear or pale yellow (unless you have a medical condition that requires fluid restriction). Increasing fluids may help thin your respiratory secretions (sputum) and reduce chest congestion, and it will prevent dehydration.   Take medicines only as directed by your health care provider.  If you were prescribed an antibiotic medicine, finish it all even if you start to feel better.  Avoid smoking and secondhand smoke. Exposure to cigarette smoke or irritating chemicals will make bronchitis worse. If you are a smoker, consider using nicotine gum or skin patches to help control withdrawal symptoms. Quitting smoking will help your lungs heal faster.   Reduce the chances of another bout of acute bronchitis by washing your hands frequently, avoiding people with cold symptoms, and trying not to touch your hands to your mouth, nose, or eyes.   Keep all follow-up visits as directed by your health care provider.  SEEK MEDICAL CARE IF: Your symptoms do not improve after 1 week of treatment.  SEEK IMMEDIATE MEDICAL CARE IF:  You develop an increased fever or chills.   You have chest pain.   You have severe shortness of breath.  You have bloody sputum.   You develop dehydration.  You faint or repeatedly feel like you are going to pass out.  You develop repeated vomiting.  You develop a severe headache. MAKE SURE YOU:   Understand these instructions.  Will watch your condition.  Will   get help right away if you are not doing well or get worse. Document Released: 08/21/2004 Document Revised: 11/28/2013 Document Reviewed: 01/04/2013 Exit[employer] Patient Information 2015 [employer]. This information is not intended to replace advice given to you by your health care provider. Make sure you discuss  any questions you have with your health care provider.  

## 2015-04-25 NOTE — Assessment & Plan Note (Signed)
Symptoms and exam consistent with bronchitis. Given potential for decreased immune function secondary to diabetes, start levofloxacin. Start phenergan-DM as needed for cough and sleep. Breo sample provided for wheezing and cough. Continue over the counter medications as needed for symptom relief and supportive care. Follow up if symptoms worsen or fail to improve.

## 2015-04-27 ENCOUNTER — Other Ambulatory Visit: Payer: Self-pay | Admitting: Endocrinology

## 2015-07-29 HISTORY — PX: HYSTERECTOMY: SHX81

## 2015-08-31 ENCOUNTER — Other Ambulatory Visit: Payer: BC Managed Care – PPO

## 2015-09-03 ENCOUNTER — Ambulatory Visit: Payer: BC Managed Care – PPO | Admitting: Endocrinology

## 2015-09-12 ENCOUNTER — Other Ambulatory Visit (INDEPENDENT_AMBULATORY_CARE_PROVIDER_SITE_OTHER): Payer: BC Managed Care – PPO

## 2015-09-12 DIAGNOSIS — E119 Type 2 diabetes mellitus without complications: Secondary | ICD-10-CM | POA: Diagnosis not present

## 2015-09-12 LAB — BASIC METABOLIC PANEL
BUN: 18 mg/dL (ref 6–23)
CALCIUM: 8.9 mg/dL (ref 8.4–10.5)
CO2: 24 mEq/L (ref 19–32)
Chloride: 105 mEq/L (ref 96–112)
Creatinine, Ser: 0.95 mg/dL (ref 0.40–1.20)
GFR: 81.2 mL/min (ref >60.00)
GLUCOSE: 225 mg/dL — AB (ref 70–99)
POTASSIUM: 4.2 meq/L (ref 3.5–5.1)
SODIUM: 135 meq/L (ref 135–145)

## 2015-09-12 LAB — HEMOGLOBIN A1C: Hgb A1c MFr Bld: 10.2 % — ABNORMAL HIGH (ref 4.6–6.5)

## 2015-09-19 ENCOUNTER — Ambulatory Visit (INDEPENDENT_AMBULATORY_CARE_PROVIDER_SITE_OTHER): Payer: BC Managed Care – PPO | Admitting: Endocrinology

## 2015-09-19 VITALS — BP 152/94 | HR 86 | Temp 97.7°F | Ht 65.0 in | Wt 191.0 lb

## 2015-09-19 DIAGNOSIS — Z794 Long term (current) use of insulin: Secondary | ICD-10-CM | POA: Diagnosis not present

## 2015-09-19 DIAGNOSIS — I10 Essential (primary) hypertension: Secondary | ICD-10-CM | POA: Diagnosis not present

## 2015-09-19 DIAGNOSIS — E1165 Type 2 diabetes mellitus with hyperglycemia: Secondary | ICD-10-CM

## 2015-09-19 DIAGNOSIS — E785 Hyperlipidemia, unspecified: Secondary | ICD-10-CM | POA: Diagnosis not present

## 2015-09-19 MED ORDER — GLUCOSE BLOOD VI STRP: ORAL_STRIP | Status: DC

## 2015-09-19 MED ORDER — METFORMIN HCL 500 MG PO TABS: ORAL_TABLET | ORAL | Status: DC

## 2015-09-19 MED ORDER — EMPAGLIFLOZIN 10 MG PO TABS: 10.0000 mg | ORAL_TABLET | Freq: Every day | ORAL | Status: DC

## 2015-09-19 MED ORDER — INSULIN DEGLUDEC 100 UNIT/ML ~~LOC~~ SOPN: 20.0000 [IU] | PEN_INJECTOR | Freq: Every day | SUBCUTANEOUS | Status: DC

## 2015-09-19 MED ORDER — VICTOZA 18 MG/3ML ~~LOC~~ SOPN: PEN_INJECTOR | SUBCUTANEOUS | Status: DC

## 2015-09-19 NOTE — Patient Instructions (Signed)
Tresiba 20 units daily, insulin  Victoza 0.6 to start  Check blood sugars on waking up 2  times a week Also check blood sugars about 2 hours after a meal and do this after different meals by rotation  Recommended blood sugar levels on waking up is 90-130 and about 2 hours after meal is 130-160  Please bring your blood sugar monitor to each visit, thank you

## 2015-09-19 NOTE — Progress Notes (Signed)
Patient ID: [name] Hurley, female   DOB: 08-26-1970, 45 y.o.   MRN: [id number]    Reason for Appointment: f/u for Type 2 Diabetes  History of Present Illness:          Diagnosis: Type 2 diabetes mellitus, date of diagnosis: 2000        Past history: She had symptoms of hyperglycemia or diagnoses and a glucose of 302.  She was initially treated with insulin doses tid for 2 yrs Subsequently she was treated with Glucophage and Avandamet In 2003 because of gestational diabetes and hyperglycemia she was treated with premixed insulin Afterwards she was continued on insulin and metformin; she thinks she has been taking Lantus for about 5 years Apparently her diabetes control has been very poor consistently but no records are available. She thinks her A1c was 10% in 2012 and also was high in 2010 She was on 25 units of Lantus without any mealtime insulin for quite some time and her A1c was 11.1% at her initial consultation in 10/2013  Recent history:       INSULIN regimen previously was :  Lantus 18-22 units hs   She was on a multiple drug regimen of 2 g metformin, Invokana 100 and Victoza along with her basal insulin of Lantus 22 units  She has not been seen in follow-up since 11/2014 She has been totally noncompliant with all measures of self-care and medications since about 9/16  Her blood sugars are out of control with her not taking any medications as above She is also symptomatic with increased thirst, urination or nocturia She has not been following her diet and has gained a lot of weight Has not monitored her blood sugar   Oral hypoglycemic drugs the patient is taking WUJ:[id number] now, was on  Metformin 1000 mg twice a day , Invokana 100 mg daily      Side effects from medications have been: None  Glucose monitoring:  none recently       Glucometer: One Touch ultra mini.      Blood Glucose readings  ?    Hypoglycemia: Minimal      Self-care: The diet that the patient has  been following is none  Meals: 2-3 meals per day. Supper 7 pm          Exercise: She is doing a little more walking Dietician visit: Most recent: At diagnosis and 2001.               Compliance with the medical regimen: fair Retinal exam: Most recent: 9/14, no known retinopathy   Microalbumin level: Normal in 2/16   Weight history: 180-220 pounds, highest after pregnancy  Wt Readings from Last 3 Encounters:  09/19/15 191 lb (86.637 kg)  04/25/15 181 lb (82.101 kg)  03/16/15 152 lb (68.947 kg)   Glycemic control:    Lab Results  Component Value Date   HGBA1C 10.2* 09/12/2015   HGBA1C 6.7* 12/08/2014   HGBA1C 7.2* 08/29/2014   Lab Results  Component Value Date   MICROALBUR 8.2* 08/29/2014   LDLCALC 79 12/08/2014   CREATININE 0.95 09/12/2015   No visits with results within 1 Week(s) from this visit. Latest known visit with results is:  Appointment on 09/12/2015  Component Date Value Ref Range Status  . Hgb A1c MFr Bld 09/12/2015 10.2* 4.6 - 6.5 % Final   Glycemic Control Guidelines for People with Diabetes:Non Diabetic:  <6%Goal of Therapy: <7%Additional Action Suggested:  >8%   . Sodium  09/12/2015 135  135 - 145 mEq/L Final  . Potassium 09/12/2015 4.2  3.5 - 5.1 mEq/L Final  . Chloride 09/12/2015 105  96 - 112 mEq/L Final  . CO2 09/12/2015 24  19 - 32 mEq/L Final  . Glucose, Bld 09/12/2015 225* 70 - 99 mg/dL Final  . BUN 16/04/9603 18  6 - 23 mg/dL Final  . Creatinine, Ser 09/12/2015 0.95  0.40 - 1.20 mg/dL Final  . Calcium 54/03/8118 8.9  8.4 - 10.5 mg/dL Final  . GFR 14/78/2956 81.20  >60.00 mL/min Final      Medication List       This list is accurate as of: 09/19/15  4:25 PM.  Always use your most recent med list.               empagliflozin 10 MG Tabs tablet  Commonly known as:  JARDIANCE  Take 10 mg by mouth daily.     fluticasone furoate-vilanterol 100-25 MCG/INH Aepb  Commonly known as:  BREO ELLIPTA  Inhale 1 puff into the lungs daily as needed.       glucose blood test strip  Commonly known as:  ONE TOUCH ULTRA TEST  Use as instructed to check blood sugar 2 times per day dx code 250.02     Insulin Degludec 100 UNIT/ML Sopn  Commonly known as:  TRESIBA FLEXTOUCH  Inject 20 Units into the skin daily.     LANTUS SOLOSTAR 100 UNIT/ML Solostar Pen  Generic drug:  Insulin Glargine  INJECT 22 UNITS INTO THE SKIN AT BEDTIME     levofloxacin 500 MG tablet  Commonly known as:  LEVAQUIN  Take 1 tablet (500 mg total) by mouth daily.     losartan 25 MG tablet  Commonly known as:  COZAAR  TAKE ONE TABLET BY MOUTH ONCE DAILY     metFORMIN 500 MG tablet  Commonly known as:  GLUCOPHAGE  TAKE TWO TABLETS BY MOUTH TWICE DAILY WITH MEALS     pravastatin 10 MG tablet  Commonly known as:  PRAVACHOL  TAKE ONE TABLET BY MOUTH ONCE DAILY     promethazine-dextromethorphan 6.25-15 MG/5ML syrup  Commonly known as:  PROMETHAZINE-DM  Take 5 mLs by mouth 4 (four) times daily as needed.     traMADol 50 MG tablet  Commonly known as:  ULTRAM  as needed.     VICTOZA 18 MG/3ML Sopn  Generic drug:  Liraglutide  INJECT 1.2MG  SUBCUTANEOUSLY DAILY AT THE SAME TIME EACH DAY        Allergies: No Known Allergies  Past Medical History  Diagnosis Date  . Diabetes mellitus   . Sciatica   . Hypertension   . Dyspareunia     Past Surgical History  Procedure Laterality Date  . Cesarean section    . Paragard      removal 7-16    Family History  Problem Relation Age of Onset  . Hypertension Father   . COPD Father   . Emphysema Father   . Diabetes Maternal Aunt   . Heart disease Neg Hx   . Hypertension Mother   . Hyperlipidemia Mother   . Colon cancer Mother   . COPD Paternal Grandfather   . Hypertension Brother     Social History:  reports that she has been smoking Cigarettes.  She has a 22 pack-year smoking history. She has never used smokeless tobacco. She reports that she does not drink alcohol or use illicit drugs.    Review of  Systems:.  HYPERTENSION: She had been started on losartan in 2016 but has not taken this  Lab Results  Component Value Date   CREATININE 0.95 09/12/2015   BUN 18 09/12/2015   NA 135 09/12/2015   K 4.2 09/12/2015   CL 105 09/12/2015   CO2 24 09/12/2015         Lipids: She has been on low-dose pravastatin for several years, was told this is for cardiovascular prophylaxis   Lab Results  Component Value Date   CHOL 122 12/08/2014   HDL 28.00* 12/08/2014   LDLCALC 79 12/08/2014   LDLDIRECT 87.4 02/27/2014   TRIG 74.0 12/08/2014   CHOLHDL 4 12/08/2014       EXAMINATION:   BP 152/94 mmHg  Pulse 86  Temp(Src) 97.7 F (36.5 C) (Oral)  Ht 5\' 5"  (1.651 m)  Wt 191 lb (86.637 kg)  BMI 31.78 kg/m2  SpO2 94%    no pedal edema    ASSESSMENT/PLAN  Diabetes type 2 with obesity  Her blood sugars are poorly controlled now with not taking her diabetes medications and insulin and doing poorly on diet She is symptomatic from hyperglycemia and appears to be in denial with her diabetes  She agrees to start back on her insulin regimen as well as oral medications as well as following diet and glucose monitoring as discussed above Invokana is not apparently covered and will change this to Jardiance 10 mg Also since Lantus is not covered was changed to [place] 20 units. Discussed potentially reducing the dose if morning sugars start coming below 100 Also discussed starting Victoza with 0.6 instead of 1.2 in the beginning Discussed timing and targets of blood sugars and to bring blood sugar monitor for each visit for download  Stressed importance of her diabetes control long-term for preventing complications and explained to her the level of A1c she now has  Hypertension: Blood pressure appears uncontrolled and may improve with her starting back on Jardiance and working on her diet and weight loss.  Needs urine microalbumin checked  LIPIDS: Will need follow-up   Patient Instructions   Tresiba 20 units daily, insulin  Victoza 0.6 to start  Check blood sugars on waking up 2  times a week Also check blood sugars about 2 hours after a meal and do this after different meals by rotation  Recommended blood sugar levels on waking up is 90-130 and about 2 hours after meal is 130-160  Please bring your blood sugar monitor to each visit, thank you      Counseling time on subjects discussed above is over 50% of today's 25 minute visit   Dwon Sky 09/19/2015, 4:25 PM   Note: This office note was prepared with Dragon voice recognition system technology. Any transcriptional errors that result from this process are unintentional.

## 2015-09-21 ENCOUNTER — Other Ambulatory Visit: Payer: Self-pay

## 2015-09-21 DIAGNOSIS — R059 Cough, unspecified: Secondary | ICD-10-CM

## 2015-09-21 DIAGNOSIS — R05 Cough: Secondary | ICD-10-CM

## 2015-09-21 MED ORDER — PRAVASTATIN SODIUM 10 MG PO TABS: 10.0000 mg | ORAL_TABLET | Freq: Every day | ORAL | Status: DC

## 2015-09-21 NOTE — Telephone Encounter (Signed)
Forwarded to PCP for advise

## 2015-09-21 NOTE — Telephone Encounter (Signed)
Pt states these two meds were going to be refilled during OV and were not sent. I stated antibiotics were not typically rf and had to be refilled to provider for approval. Will await response. Labs were put in and were not obtained. Please advise

## 2015-09-21 NOTE — Telephone Encounter (Signed)
Pravastatin is prescribed by her PCP and have not prescribed antibiotic

## 2015-09-23 ENCOUNTER — Other Ambulatory Visit: Payer: Self-pay | Admitting: Endocrinology

## 2015-10-15 ENCOUNTER — Other Ambulatory Visit: Payer: BC Managed Care – PPO

## 2015-10-16 ENCOUNTER — Other Ambulatory Visit (INDEPENDENT_AMBULATORY_CARE_PROVIDER_SITE_OTHER): Payer: BC Managed Care – PPO

## 2015-10-16 DIAGNOSIS — Z794 Long term (current) use of insulin: Secondary | ICD-10-CM

## 2015-10-16 DIAGNOSIS — E1165 Type 2 diabetes mellitus with hyperglycemia: Secondary | ICD-10-CM

## 2015-10-16 LAB — BASIC METABOLIC PANEL
BUN: 19 mg/dL (ref 6–23)
CALCIUM: 9 mg/dL (ref 8.4–10.5)
CO2: 21 meq/L (ref 19–32)
CREATININE: 1.05 mg/dL (ref 0.40–1.20)
Chloride: 108 mEq/L (ref 96–112)
GFR: 72.32 mL/min (ref >60.00)
GLUCOSE: 101 mg/dL — AB (ref 70–99)
Potassium: 4 mEq/L (ref 3.5–5.1)
Sodium: 138 mEq/L (ref 135–145)

## 2015-10-16 LAB — LIPID PANEL
CHOL/HDL RATIO: 5
Cholesterol: 126 mg/dL (ref 0–200)
HDL: 27.8 mg/dL — ABNORMAL LOW (ref >39.00)
LDL Cholesterol: 77 mg/dL (ref 0–99)
NONHDL: 98.48
Triglycerides: 105 mg/dL (ref 0.0–149.0)
VLDL: 21 mg/dL (ref 0.0–40.0)

## 2015-10-16 LAB — MICROALBUMIN / CREATININE URINE RATIO
CREATININE, U: 81.9 mg/dL
MICROALB UR: 7.1 mg/dL — AB (ref 0.0–1.9)
MICROALB/CREAT RATIO: 8.7 mg/g (ref 0.0–30.0)

## 2015-10-17 LAB — FRUCTOSAMINE: FRUCTOSAMINE: 255 umol/L (ref 0–285)

## 2015-10-18 ENCOUNTER — Ambulatory Visit (INDEPENDENT_AMBULATORY_CARE_PROVIDER_SITE_OTHER): Payer: BC Managed Care – PPO | Admitting: Endocrinology

## 2015-10-18 ENCOUNTER — Encounter: Payer: Self-pay | Admitting: Endocrinology

## 2015-10-18 VITALS — BP 128/74 | HR 83 | Temp 98.6°F | Resp 16 | Ht 65.0 in | Wt 186.6 lb

## 2015-10-18 DIAGNOSIS — Z794 Long term (current) use of insulin: Secondary | ICD-10-CM

## 2015-10-18 DIAGNOSIS — E1165 Type 2 diabetes mellitus with hyperglycemia: Secondary | ICD-10-CM | POA: Diagnosis not present

## 2015-10-18 NOTE — Progress Notes (Signed)
Patient ID: [name] Hurley, female   DOB: [birth year], 45 y.o.   MRN: [id number]    Reason for Appointment: f/u for Type 2 Diabetes  History of Present Illness:          Diagnosis: Type 2 diabetes mellitus, date of diagnosis: 2000        Past history: She had symptoms of hyperglycemia or diagnoses and a glucose of 302.  She was initially treated with insulin doses tid for 2 yrs Subsequently she was treated with Glucophage and Avandamet In 2003 because of gestational diabetes and hyperglycemia she was treated with premixed insulin Afterwards she was continued on insulin and metformin; she thinks she has been taking Lantus for about 5 years Apparently her diabetes control has been very poor consistently but no records are available. She thinks her A1c was 10% in 2012 and also was high in 2010 She was on 25 units of Lantus without any mealtime insulin for quite some time and her A1c was 11.1% at her initial consultation in 10/2013  Recent history:       She is on a multiple drug regimen of 2 g metformin, Invokana 100 and Victoza 1.2mg  along with her basal insulin of Tresiba 20 units  She had been seen after several months interval and A1c was 10.2 from noncompliance with medications and all measures of self-care  With getting back on her multidrug regimen her blood sugars are excellent and reportedly normal at home Fructosamine is normal at 255  She is taking relatively low dose of 20 units of insulin and is getting consistent control all day with [place] Most likely with using Victoza and Jardiance she is losing weight also No side effects with these Overall compliance with diet and exercise is also improved    Oral hypoglycemic drugs the patient is taking are: As above     Side effects from medications have been: None  Glucose monitoring:  none recently       Glucometer: One Touch ultra mini.      Blood Glucose readings  by recall:  Mean values apply above for all meters  except median for One Touch  PRE-MEAL Fasting Lunch Dinner Bedtime Overall  Glucose range: 78-100   116   Mean/median:          Hypoglycemia: none     Self-care: The diet that the patient has been following is none  Meals: 2-3 meals per day. Supper 7 pm          Exercise: She is doing a little more walking 3/7 upto 60 min Dietician visit: Most recent: At diagnosis and 2001.               Compliance with the medical regimen: fair Retinal exam: Most recent: 9/14, no known retinopathy   Microalbumin level: Normal in 2/16   Weight history: 180-220 pounds, highest after pregnancy  Wt Readings from Last 3 Encounters:  10/18/15 186 lb 9.6 oz (84.641 kg)  09/19/15 191 lb (86.637 kg)  04/25/15 181 lb (82.101 kg)   Glycemic control:    Lab Results  Component Value Date   HGBA1C 10.2* 09/12/2015   HGBA1C 6.7* 12/08/2014   HGBA1C 7.2* 08/29/2014   Lab Results  Component Value Date   MICROALBUR 7.1* 10/16/2015   LDLCALC 77 10/16/2015   CREATININE 1.05 10/16/2015   Lab on 10/16/2015  Component Date Value Ref Range Status  . Sodium 10/16/2015 138  135 - 145 mEq/L Final  . Potassium  10/16/2015 4.0  3.5 - 5.1 mEq/L Final  . Chloride 10/16/2015 108  96 - 112 mEq/L Final  . CO2 10/16/2015 21  19 - 32 mEq/L Final  . Glucose, Bld 10/16/2015 101* 70 - 99 mg/dL Final  . BUN 16/04/9603 19  6 - 23 mg/dL Final  . Creatinine, Ser 10/16/2015 1.05  0.40 - 1.20 mg/dL Final  . Calcium 54/03/8118 9.0  8.4 - 10.5 mg/dL Final  . GFR 14/78/2956 72.32  >60.00 mL/min Final  . Fructosamine 10/16/2015 255  0 - 285 umol/L Final   Comment: Published reference interval for apparently healthy subjects between age 28 and 71 is 21 - 285 umol/L and in a poorly controlled diabetic population is 228 - 563 umol/L with a mean of 396 umol/L.   Marland Kitchen Cholesterol 10/16/2015 126  0 - 200 mg/dL Final   ATP III Classification       Desirable:  < 200 mg/dL               Borderline High:  200 - 239 mg/dL          High:   > = 213 mg/dL  . Triglycerides 10/16/2015 105.0  0.0 - 149.0 mg/dL Final   Normal:  <086 mg/dLBorderline High:  150 - 199 mg/dL  . HDL 10/16/2015 27.80* >39.00 mg/dL Final  . VLDL 57/84/6962 21.0  0.0 - 40.0 mg/dL Final  . LDL Cholesterol 10/16/2015 77  0 - 99 mg/dL Final  . Total CHOL/HDL Ratio 10/16/2015 5   Final                  Men          Women1/2 Average Risk     3.4          3.3Average Risk          5.0          4.42X Average Risk          9.6          7.13X Average Risk          15.0          11.0                      . NonHDL 10/16/2015 98.48   Final   NOTE:  Non-HDL goal should be 30 mg/dL higher than patient's LDL goal (i.e. LDL goal of < 70 mg/dL, would have non-HDL goal of < 100 mg/dL)  . Microalb, Ur 10/16/2015 7.1* 0.0 - 1.9 mg/dL Final  . Creatinine,U 95/28/4132 81.9   Final  . Microalb Creat Ratio 10/16/2015 8.7  0.0 - 30.0 mg/g Final      Medication List       This list is accurate as of: 10/18/15  4:34 PM.  Always use your most recent med list.               empagliflozin 10 MG Tabs tablet  Commonly known as:  JARDIANCE  Take 10 mg by mouth daily.     fluticasone furoate-vilanterol 100-25 MCG/INH Aepb  Commonly known as:  BREO ELLIPTA  Inhale 1 puff into the lungs daily as needed.     glucose blood test strip  Commonly known as:  ONE TOUCH ULTRA TEST  Use as instructed to check blood sugar 2 times per day dx code 250.02     Insulin Degludec 100 UNIT/ML Sopn  Commonly known as:  TRESIBA FLEXTOUCH  Inject 20 Units into the skin daily.     losartan 25 MG tablet  Commonly known as:  COZAAR  TAKE ONE TABLET BY MOUTH ONCE DAILY     metFORMIN 500 MG tablet  Commonly known as:  GLUCOPHAGE  TAKE TWO TABLETS BY MOUTH TWICE DAILY WITH MEALS     pravastatin 10 MG tablet  Commonly known as:  PRAVACHOL  Take 1 tablet (10 mg total) by mouth daily.     promethazine-dextromethorphan 6.25-15 MG/5ML syrup  Commonly known as:  PROMETHAZINE-DM  Take 5 mLs by  mouth 4 (four) times daily as needed.     traMADol 50 MG tablet  Commonly known as:  ULTRAM  as needed.     VICTOZA 18 MG/3ML Sopn  Generic drug:  Liraglutide  INJECT 1.2MG  SUBCUTANEOUSLY DAILY AT THE SAME TIME EACH DAY        Allergies: No Known Allergies  Past Medical History  Diagnosis Date  . Diabetes mellitus   . Sciatica   . Hypertension   . Dyspareunia     Past Surgical History  Procedure Laterality Date  . Cesarean section    . Paragard      removal 7-16    Family History  Problem Relation Age of Onset  . Hypertension Father   . COPD Father   . Emphysema Father   . Diabetes Maternal Aunt   . Heart disease Neg Hx   . Hypertension Mother   . Hyperlipidemia Mother   . Colon cancer Mother   . COPD Paternal Grandfather   . Hypertension Brother     Social History:  reports that she has been smoking Cigarettes.  She has a 22 pack-year smoking history. She has never used smokeless tobacco. She reports that she does not drink alcohol or use illicit drugs.    Review of Systems:.  HYPERTENSION: She had been started on losartan in 2016   Lab Results  Component Value Date   CREATININE 1.05 10/16/2015   BUN 19 10/16/2015   NA 138 10/16/2015   K 4.0 10/16/2015   CL 108 10/16/2015   CO2 21 10/16/2015         Lipids: She has been on low-dose pravastatin for several years, was told this is for cardiovascular prophylaxis   Lab Results  Component Value Date   CHOL 126 10/16/2015   HDL 27.80* 10/16/2015   LDLCALC 77 10/16/2015   LDLDIRECT 87.4 02/27/2014   TRIG 105.0 10/16/2015   CHOLHDL 5 10/16/2015       EXAMINATION:   BP 128/74 mmHg  Pulse 83  Temp(Src) 98.6 F (37 C)  Resp 16  Ht 5\' 5"  (1.651 m)  Wt 186 lb 9.6 oz (84.641 kg)  BMI 31.05 kg/m2  SpO2 98%     ASSESSMENT/PLAN  Diabetes type 2 with obesity See history of present illness for detailed discussion of current diabetes management, blood sugar patterns and problems  identified  With getting back on her multidrug regimen her blood sugars are excellent and reportedly normal at home Fructosamine is normal at 255  She is taking relatively low dose of 20 units of insulin and is getting consistent control all day with [place] Most likely with using Victoza and Jardiance she is losing weight also No side effects with these Overall compliance with diet and exercise is also improved  For now she will continue the same regimen except reduce her [name] about 4 units  Hypertension: Blood pressure is back to normal with restarting Jardiance  and probably improved lifestyle and some weight loss  LIPIDS: Controlled except low HDL   Patient Instructions  16 units Tresiba   Cut back 2 units if am sugar stays <100  Keep walking      [facility] IncKUMAR,Lachlan Pelto 10/18/2015, 4:34 PM   Note: This office note was prepared with Dragon voice recognition system technology. Any transcriptional errors that result from this process are unintentional.

## 2015-10-18 NOTE — Patient Instructions (Signed)
16 units Tresiba   Cut back 2 units if am sugar stays <100  Keep walking

## 2015-12-13 ENCOUNTER — Other Ambulatory Visit: Payer: BC Managed Care – PPO

## 2015-12-19 ENCOUNTER — Ambulatory Visit: Payer: BC Managed Care – PPO | Admitting: Endocrinology

## 2015-12-19 DIAGNOSIS — Z0289 Encounter for other administrative examinations: Secondary | ICD-10-CM

## 2016-01-07 ENCOUNTER — Other Ambulatory Visit: Payer: Self-pay | Admitting: Endocrinology

## 2016-01-21 ENCOUNTER — Other Ambulatory Visit (INDEPENDENT_AMBULATORY_CARE_PROVIDER_SITE_OTHER): Payer: BC Managed Care – PPO

## 2016-01-21 DIAGNOSIS — E1165 Type 2 diabetes mellitus with hyperglycemia: Secondary | ICD-10-CM

## 2016-01-21 DIAGNOSIS — Z794 Long term (current) use of insulin: Secondary | ICD-10-CM

## 2016-01-21 LAB — COMPREHENSIVE METABOLIC PANEL
ALT: 12 U/L (ref 0–35)
AST: 19 U/L (ref 0–37)
Albumin: 4 g/dL (ref 3.5–5.2)
Alkaline Phosphatase: 48 U/L (ref 39–117)
BUN: 16 mg/dL (ref 6–23)
CALCIUM: 9 mg/dL (ref 8.4–10.5)
CHLORIDE: 110 meq/L (ref 96–112)
CO2: 23 meq/L (ref 19–32)
Creatinine, Ser: 1.11 mg/dL (ref 0.40–1.20)
GFR: 67.75 mL/min (ref >60.00)
GLUCOSE: 82 mg/dL (ref 70–99)
Potassium: 4.1 mEq/L (ref 3.5–5.1)
Sodium: 140 mEq/L (ref 135–145)
Total Bilirubin: 0.2 mg/dL (ref 0.2–1.2)
Total Protein: 6.8 g/dL (ref 6.0–8.3)

## 2016-01-21 LAB — HEMOGLOBIN A1C: Hgb A1c MFr Bld: 6.5 % (ref 4.6–6.5)

## 2016-01-23 ENCOUNTER — Encounter: Payer: Self-pay | Admitting: Endocrinology

## 2016-01-23 ENCOUNTER — Ambulatory Visit (INDEPENDENT_AMBULATORY_CARE_PROVIDER_SITE_OTHER): Payer: BC Managed Care – PPO | Admitting: Endocrinology

## 2016-01-23 VITALS — BP 114/79 | HR 93 | Ht 65.0 in | Wt 176.0 lb

## 2016-01-23 DIAGNOSIS — E119 Type 2 diabetes mellitus without complications: Secondary | ICD-10-CM | POA: Diagnosis not present

## 2016-01-23 DIAGNOSIS — E785 Hyperlipidemia, unspecified: Secondary | ICD-10-CM | POA: Diagnosis not present

## 2016-01-23 NOTE — Progress Notes (Signed)
Patient ID: [name] Hurley, female   DOB: 09/21/1970, 45 y.o.   MRN: [id number]    Reason for Appointment: f/u for Type 2 Diabetes  History of Present Illness:          Diagnosis: Type 2 diabetes mellitus, date of diagnosis: 2000        Past history: She had symptoms of hyperglycemia or diagnoses and a glucose of 302.  She was initially treated with insulin doses tid for 2 yrs Subsequently she was treated with Glucophage and Avandamet In 2003 because of gestational diabetes and hyperglycemia she was treated with premixed insulin Afterwards she was continued on insulin and metformin; she thinks she has been taking Lantus for about 5 years Apparently her diabetes control has been very poor consistently but no records are available. She thinks her A1c was 10% in 2012 and also was high in 2010 She was on 25 units of Lantus without any mealtime insulin for quite some time and her A1c was 11.1% at her initial consultation in 10/2013  Recent history:       She is on a multiple drug regimen of 2 g metformin, Invokana 100 and Victoza 1.2mg  along with her basal insulin of Tresiba 16 units  Current management, blood sugar patterns and problems identified:  She has done very well with trying to lose weight and weight is coming down by 10 pounds  She is trying to exercise more especially when her school work is less  However she has not checked her blood sugar much and currently has expired test strip.  Has only 3 readings in the morning.  Blood sugar in the lab was 82 after some fruit  She is compliant with her medications otherwise  Fasting blood sugar does not appear to be any different with reducing her insulin by 4 units on her last visit   Oral hypoglycemic drugs the patient is taking are: As above     Side effects from medications have been: None  Glucose monitoring: recently  3x     Glucometer: One Touch ultra mini.      Blood Glucose readings  69-84, all in the morning  within the last 5 days  Hypoglycemia: none     Self-care: The diet that the patient has been following is controlled portions usually  Meals: 2-3 meals per day. Supper 7 pm          Exercise: She is doing more walking 4/7 upto 60 min Dietician visit: Most recent: At diagnosis and 2001.               Compliance with the medical regimen: fair Retinal exam: Most recent: 9/14, no known retinopathy   Microalbumin level: Normal in 2/16   Weight history: 180-220 pounds, highest after pregnancy  Wt Readings from Last 3 Encounters:  01/23/16 176 lb (79.833 kg)  10/18/15 186 lb 9.6 oz (84.641 kg)  09/19/15 191 lb (86.637 kg)   Glycemic control:    Lab Results  Component Value Date   HGBA1C 6.5 01/21/2016   HGBA1C 10.2* 09/12/2015   HGBA1C 6.7* 12/08/2014   Lab Results  Component Value Date   MICROALBUR 7.1* 10/16/2015   LDLCALC 77 10/16/2015   CREATININE 1.11 01/21/2016   Lab on 01/21/2016  Component Date Value Ref Range Status  . Hgb A1c MFr Bld 01/21/2016 6.5  4.6 - 6.5 % Final   Glycemic Control Guidelines for People with Diabetes:Non Diabetic:  <6%Goal of Therapy: <7%Additional Action Suggested:  >  8%   . Sodium 01/21/2016 140  135 - 145 mEq/L Final  . Potassium 01/21/2016 4.1  3.5 - 5.1 mEq/L Final  . Chloride 01/21/2016 110  96 - 112 mEq/L Final  . CO2 01/21/2016 23  19 - 32 mEq/L Final  . Glucose, Bld 01/21/2016 82  70 - 99 mg/dL Final  . BUN 16/04/9603 16  6 - 23 mg/dL Final  . Creatinine, Ser 01/21/2016 1.11  0.40 - 1.20 mg/dL Final  . Total Bilirubin 01/21/2016 0.2  0.2 - 1.2 mg/dL Final  . Alkaline Phosphatase 01/21/2016 48  39 - 117 U/L Final  . AST 01/21/2016 19  0 - 37 U/L Final  . ALT 01/21/2016 12  0 - 35 U/L Final  . Total Protein 01/21/2016 6.8  6.0 - 8.3 g/dL Final  . Albumin 54/03/8118 4.0  3.5 - 5.2 g/dL Final  . Calcium 14/78/2956 9.0  8.4 - 10.5 mg/dL Final  . GFR 21/30/8657 67.75  >60.00 mL/min Final   Other active problems are discussed in review  of systems     Medication List       This list is accurate as of: 01/23/16  1:27 PM.  Always use your most recent med list.               fluticasone furoate-vilanterol 100-25 MCG/INH Aepb  Commonly known as:  BREO ELLIPTA  Inhale 1 puff into the lungs daily as needed.     glucose blood test strip  Commonly known as:  ONE TOUCH ULTRA TEST  Use as instructed to check blood sugar 2 times per day dx code 250.02     Insulin Degludec 100 UNIT/ML Sopn  Commonly known as:  TRESIBA FLEXTOUCH  Inject 20 Units into the skin daily.     JARDIANCE 10 MG Tabs tablet  Generic drug:  empagliflozin  TAKE ONE TABLET BY MOUTH ONCE DAILY     losartan 25 MG tablet  Commonly known as:  COZAAR  TAKE ONE TABLET BY MOUTH ONCE DAILY     metFORMIN 500 MG tablet  Commonly known as:  GLUCOPHAGE  TAKE TWO TABLETS BY MOUTH TWICE DAILY WITH MEALS     pravastatin 10 MG tablet  Commonly known as:  PRAVACHOL  Take 1 tablet (10 mg total) by mouth daily.     promethazine-dextromethorphan 6.25-15 MG/5ML syrup  Commonly known as:  PROMETHAZINE-DM  Take 5 mLs by mouth 4 (four) times daily as needed.     traMADol 50 MG tablet  Commonly known as:  ULTRAM  as needed.     VICTOZA 18 MG/3ML Sopn  Generic drug:  Liraglutide  INJECT 1.2MG  SUBCUTANEOUSLY DAILY AT THE SAME TIME EACH DAY        Allergies: No Known Allergies  Past Medical History  Diagnosis Date  . Diabetes mellitus   . Sciatica   . Hypertension   . Dyspareunia     Past Surgical History  Procedure Laterality Date  . Cesarean section    . Paragard      removal 7-16    Family History  Problem Relation Age of Onset  . Hypertension Father   . COPD Father   . Emphysema Father   . Diabetes Maternal Aunt   . Heart disease Neg Hx   . Hypertension Mother   . Hyperlipidemia Mother   . Colon cancer Mother   . COPD Paternal Grandfather   . Hypertension Brother     Social History:  reports that she has  been smoking Cigarettes.   She has a 22 pack-year smoking history. She has never used smokeless tobacco. She reports that she does not drink alcohol or use illicit drugs.    Review of Systems:.  HYPERTENSION: She had been started on losartan in 2016 by her PCP, currently taking only 25 mg No problems with renal function or potassium   Lab Results  Component Value Date   CREATININE 1.11 01/21/2016   BUN 16 01/21/2016   NA 140 01/21/2016   K 4.1 01/21/2016   CL 110 01/21/2016   CO2 23 01/21/2016         Lipids: She has been on low-dose pravastatin for several years, was told this is for cardiovascular prophylaxis   Lab Results  Component Value Date   CHOL 126 10/16/2015   HDL 27.80* 10/16/2015   LDLCALC 77 10/16/2015   LDLDIRECT 87.4 02/27/2014   TRIG 105.0 10/16/2015   CHOLHDL 5 10/16/2015   No recent symptoms of numbness or tingling in her feet    EXAMINATION:   BP 114/79 mmHg  Pulse 93  Ht 5\' 5"  (1.651 m)  Wt 176 lb (79.833 kg)  BMI 29.29 kg/m2    Diabetic Foot Exam - Simple   Simple Foot Form  Diabetic Foot exam was performed with the following findings:  Yes 01/23/2016  1:17 PM  Visual Inspection  No deformities, no ulcerations, no other skin breakdown bilaterally:  Yes  Sensation Testing  Intact to touch and monofilament testing bilaterally:  Yes  Pulse Check  Posterior Tibialis and Dorsalis pulse intact bilaterally:  Yes  Comments      ASSESSMENT/PLAN  Diabetes type 2 with obesity See history of present illness for detailed discussion of current diabetes management, blood sugar patterns and problems identified  Her A1c is back to below 7 and now 6.5 with her staying consistent with her multidrug regimen including Jardiance, metformin, Victoza and basal insulin Her fasting blood sugars appear to be excellent She has not done any readings after meals Since she is able to lose weight and hopefully will do so consistently may be able to taper off her insulin which is only 16 units  currently She appears to be more motivated but does need to start checking blood sugars more regularly  For now she will continue the same regimen except reduce her Tresiba 4 units, reduce by 2 units every week until down to 6 units and then stop unless blood sugar goes up over 100 fasting She will get a new bottle of test strips Consider increasing Victoza if her postprandial readings appear to be high or A1c goes up with reducing insulin  Foot exam shows no retinopathy today  Hypertension: Blood pressure is normal, probably benefiting more from Jardiance done losartan which is only 25 mg and will continue this  LIPIDS: Controlled except low HDL, may improve with weight loss and exercise  Needs regular eye exams   Patient Instructions  Insulin 12 units now and reduce by 2 units, once down to 6 units then stop unless am sugar goes >100  Check blood sugars on waking up 3  times a week  Also check blood sugars about 2 hours after a meal and do this after different meals by rotation  Recommended blood sugar levels on waking up is 90-130 and about 2 hours after meal is 130-160  Please bring your blood sugar monitor to each visit, thank you    Counseling time on subjects discussed above is over 50%  of today's 25 minute visit    Dwain Huhn 01/23/2016, 1:27 PM   Note: This office note was prepared with Insurance underwriterDragon voice recognition system technology. Any transcriptional errors that result from this process are unintentional.

## 2016-01-23 NOTE — Patient Instructions (Signed)
Insulin 12 units now and reduce by 2 units, once down to 6 units then stop unless am sugar goes >100  Check blood sugars on waking up 3  times a week  Also check blood sugars about 2 hours after a meal and do this after different meals by rotation  Recommended blood sugar levels on waking up is 90-130 and about 2 hours after meal is 130-160  Please bring your blood sugar monitor to each visit, thank you

## 2016-02-07 ENCOUNTER — Encounter: Payer: Self-pay | Admitting: Certified Nurse Midwife

## 2016-02-07 ENCOUNTER — Ambulatory Visit (INDEPENDENT_AMBULATORY_CARE_PROVIDER_SITE_OTHER): Payer: BC Managed Care – PPO | Admitting: Certified Nurse Midwife

## 2016-02-07 VITALS — BP 120/70 | HR 72 | Resp 16 | Ht 64.75 in | Wt 175.0 lb

## 2016-02-07 DIAGNOSIS — N898 Other specified noninflammatory disorders of vagina: Secondary | ICD-10-CM

## 2016-02-07 DIAGNOSIS — Z01419 Encounter for gynecological examination (general) (routine) without abnormal findings: Secondary | ICD-10-CM

## 2016-02-07 DIAGNOSIS — Z0000 Encounter for general adult medical examination without abnormal findings: Secondary | ICD-10-CM | POA: Diagnosis not present

## 2016-02-07 LAB — TSH: TSH: 0.82 m[IU]/L

## 2016-02-07 LAB — WET PREP BY MOLECULAR PROBE
Candida species: NEGATIVE
GARDNERELLA VAGINALIS: NEGATIVE
TRICHOMONAS VAG: NEGATIVE

## 2016-02-07 NOTE — Patient Instructions (Signed)
EXERCISE AND DIET:  We recommended that you start or continue a regular exercise program for good health. Regular exercise means any activity that makes your heart beat faster and makes you sweat.  We recommend exercising at least 30 minutes per day at least 3 days a week, preferably 4 or 5.  We also recommend a diet low in fat and sugar.  Inactivity, poor dietary choices and obesity can cause diabetes, heart attack, stroke, and kidney damage, among others.    ALCOHOL AND SMOKING:  Women should limit their alcohol intake to no more than 7 drinks/beers/glasses of wine (combined, not each!) per week. Moderation of alcohol intake to this level decreases your risk of breast cancer and liver damage. And of course, no recreational drugs are part of a healthy lifestyle.  And absolutely no smoking or even second hand smoke. Most people know smoking can cause heart and lung diseases, but did you know it also contributes to weakening of your bones? Aging of your skin?  Yellowing of your teeth and nails?  CALCIUM AND VITAMIN D:  Adequate intake of calcium and Vitamin D are recommended.  The recommendations for exact amounts of these supplements seem to change often, but generally speaking 600 mg of calcium (either carbonate or citrate) and 800 units of Vitamin D per day seems prudent. Certain women may benefit from higher intake of Vitamin D.  If you are among these women, your doctor will have told you during your visit.    PAP SMEARS:  Pap smears, to check for cervical cancer or precancers,  have traditionally been done yearly, although recent scientific advances have shown that most women can have pap smears less often.  However, every woman still should have a physical exam from her gynecologist every year. It will include a breast check, inspection of the vulva and vagina to check for abnormal growths or skin changes, a visual exam of the cervix, and then an exam to evaluate the size and shape of the uterus and  ovaries.  And after 47 years of age, a rectal exam is indicated to check for rectal cancers. We will also provide age appropriate advice regarding health maintenance, like when you should have certain vaccines, screening for sexually transmitted diseases, bone density testing, colonoscopy, mammograms, etc.   MAMMOGRAMS:  All women over 40 years old should have a yearly mammogram. Many facilities now offer a "3D" mammogram, which may cost around $50 extra out of pocket. If possible,  we recommend you accept the option to have the 3D mammogram performed.  It both reduces the number of women who will be called back for extra views which then turn out to be normal, and it is better than the routine mammogram at detecting truly abnormal areas.    COLONOSCOPY:  Colonoscopy to screen for colon cancer is recommended for all women at age 50.  We know, you hate the idea of the prep.  We agree, BUT, having colon cancer and not knowing it is worse!!  Colon cancer so often starts as a polyp that can be seen and removed at colonscopy, which can quite literally save your life!  And if your first colonoscopy is normal and you have no family history of colon cancer, most women don't have to have it again for 10 years.  Once every ten years, you can do something that may end up saving your life, right?  We will be happy to help you get it scheduled when you are ready.    Be sure to check your insurance coverage so you understand how much it will cost.  It may be covered as a preventative service at no cost, but you should check your particular policy.     Perimenopause Perimenopause is the time when your body begins to move into the menopause (no menstrual period for 12 straight months). It is a natural process. Perimenopause can begin 2-8 years before the menopause and usually lasts for 1 year after the menopause. During this time, your ovaries may or may not produce an egg. The ovaries vary in their production of estrogen and  progesterone hormones each month. This can cause irregular menstrual periods, difficulty getting pregnant, vaginal bleeding between periods, and uncomfortable symptoms. CAUSES  Irregular production of the ovarian hormones, estrogen and progesterone, and not ovulating every month.  Other causes include:  Tumor of the pituitary gland in the brain.  Medical disease that affects the ovaries.  Radiation treatment.  Chemotherapy.  Unknown causes.  Heavy smoking and excessive alcohol intake can bring on perimenopause sooner. SIGNS AND SYMPTOMS   Hot flashes.  Night sweats.  Irregular menstrual periods.  Decreased sex drive.  Vaginal dryness.  Headaches.  Mood swings.  Depression.  Memory problems.  Irritability.  Tiredness.  Weight gain.  Trouble getting pregnant.  The beginning of losing bone cells (osteoporosis).  The beginning of hardening of the arteries (atherosclerosis). DIAGNOSIS  Your health care provider will make a diagnosis by analyzing your age, menstrual history, and symptoms. He or she will do a physical exam and note any changes in your body, especially your female organs. Female hormone tests may or may not be helpful depending on the amount of female hormones you produce and when you produce them. However, other hormone tests may be helpful to rule out other problems. TREATMENT  In some cases, no treatment is needed. The decision on whether treatment is necessary during the perimenopause should be made by you and your health care provider based on how the symptoms are affecting you and your lifestyle. Various treatments are available, such as:  Treating individual symptoms with a specific medicine for that symptom.  Herbal medicines that can help specific symptoms.  Counseling.  Group therapy. HOME CARE INSTRUCTIONS   Keep track of your menstrual periods (when they occur, how heavy they are, how long between periods, and how long they last) as  well as your symptoms and when they started.  Only take over-the-counter or prescription medicines as directed by your health care provider.  Sleep and rest.  Exercise.  Eat a diet that contains calcium (good for your bones) and soy (acts like the estrogen hormone).  Do not smoke.  Avoid alcoholic beverages.  Take vitamin supplements as recommended by your health care provider. Taking vitamin E may help in certain cases.  Take calcium and vitamin D supplements to help prevent bone loss.  Group therapy is sometimes helpful.  Acupuncture may help in some cases. SEEK MEDICAL CARE IF:   You have questions about any symptoms you are having.  You need a referral to a specialist (gynecologist, psychiatrist, or psychologist). SEEK IMMEDIATE MEDICAL CARE IF:   You have vaginal bleeding.  Your period lasts longer than 8 days.  Your periods are recurring sooner than 21 days.  You have bleeding after intercourse.  You have severe depression.  You have pain when you urinate.  You have severe headaches.  You have vision problems.   This information is not intended to replace advice   given to you by your health care provider. Make sure you discuss any questions you have with your health care provider.   Document Released: 08/21/2004 Document Revised: 08/04/2014 Document Reviewed: 02/10/2013 Elsevier Interactive Patient Education 2016 [employer] Inc[employer]  

## 2016-02-07 NOTE — Progress Notes (Signed)
45 y.o. G1P1001 Married  African American Fe here for annual exam. Periods normal with Paragard working well, no cramping. Occasional hot flashes.No night sweats. Sees Dr. Lucianne Muss every 3 months for glucose management diabetes type 2.and labs. Some vaginal dryness and pain with sexual activity. Spouse has ED and spur of the moment is hard. Has been using coconut oil for dryness and working well. Wonders if spouse is dry and this is the problem. No itching but slight increase in discharge, no odor. No other health issues today. Screening labs if needed.  Patient's last menstrual period was 01/18/2016.          Sexually active: Yes.    The current method of family planning is IUD.    Exercising: Yes.    walking Smoker:  yes  Health Maintenance: Pap:  12-20-14 neg HPV HR neg, no abnormals MMG: 02-22-15 category a density birads 1:neg Colonoscopy:  none BMD:   none TDaP: pt declines today Shingles: no Pneumonia: had done Hep C and HIV: HIV neg yrs ago  Labs: pcp Self breast exam: done occ  reports that she has been smoking Cigarettes.  She has a 22 pack-year smoking history. She has never used smokeless tobacco. She reports that she drinks alcohol. She reports that she does not use illicit drugs.  Past Medical History  Diagnosis Date  . Diabetes mellitus   . Sciatica   . Hypertension   . Dyspareunia     Past Surgical History  Procedure Laterality Date  . Cesarean section    . Paragard      removal 7-16    Current Outpatient Prescriptions  Medication Sig Dispense Refill  . Fluticasone Furoate-Vilanterol (BREO ELLIPTA) 100-25 MCG/INH AEPB Inhale 1 puff into the lungs daily as needed. 28 each 0  . glucose blood (ONE TOUCH ULTRA TEST) test strip Use as instructed to check blood sugar 2 times per day dx code 250.02 100 each 2  . Insulin Degludec (TRESIBA FLEXTOUCH) 100 UNIT/ML SOPN Inject 20 Units into the skin daily. 5 pen 1  . JARDIANCE 10 MG TABS tablet TAKE ONE TABLET BY MOUTH ONCE  DAILY 30 tablet 3  . losartan (COZAAR) 25 MG tablet TAKE ONE TABLET BY MOUTH ONCE DAILY 30 tablet 3  . metFORMIN (GLUCOPHAGE) 500 MG tablet TAKE TWO TABLETS BY MOUTH TWICE DAILY WITH MEALS 120 tablet 3  . pravastatin (PRAVACHOL) 10 MG tablet Take 1 tablet (10 mg total) by mouth daily. 30 tablet 3  . VICTOZA 18 MG/3ML SOPN INJECT 1.2MG  SUBCUTANEOUSLY DAILY AT THE SAME TIME EACH DAY 18 mL 1   No current facility-administered medications for this visit.    Family History  Problem Relation Age of Onset  . Hypertension Father   . COPD Father   . Emphysema Father   . Diabetes Maternal Aunt   . Heart disease Neg Hx   . Hypertension Mother   . Hyperlipidemia Mother   . Colon cancer Mother   . COPD Paternal Grandfather   . Hypertension Brother     ROS:  Pertinent items are noted in HPI.  Otherwise, a comprehensive ROS was negative.  Exam:   BP 120/70 mmHg  Pulse 72  Resp 16  Ht 5' 4.75" (1.645 m)  Wt 175 lb (79.379 kg)  BMI 29.33 kg/m2  LMP 01/18/2016 Height: 5' 4.75" (164.5 cm) Ht Readings from Last 3 Encounters:  02/07/16 5' 4.75" (1.645 m)  01/23/16  (1.651 m)  10/18/15  (1.651 m)  General appearance: alert, cooperative and appears stated age Head: Normocephalic, without obvious abnormality, atraumatic Neck: no adenopathy, supple, symmetrical, trachea midline and thyroid normal to inspection and palpation Lungs: clear to auscultation bilaterally Breasts: normal appearance, no masses or tenderness, No nipple retraction or dimpling, No nipple discharge or bleeding, No axillary or supraclavicular adenopathy Heart: regular rate and rhythm Abdomen: soft, non-tender; no masses,  no organomegaly Extremities: extremities normal, atraumatic, no cyanosis or edema Skin: Skin color, texture, turgor normal. No rashes or lesions Lymph nodes: Cervical, supraclavicular, and axillary nodes normal. No abnormal inguinal nodes palpated Neurologic: Grossly normal   Pelvic:  External genitalia:  no lesions              Urethra:  normal appearing urethra with no masses, tenderness or lesions              Bartholin's and Skene's: normal                 Vagina: normal appearing vagina with normal color and discharge, no lesions, no pain with exam, affirm taken              Cervix: no cervical motion tenderness, no lesions and normal appearance, IUD string noted in cervical os              Pap taken: No. Bimanual Exam:  Uterus:  normal size, contour, position, consistency, mobility, non-tender              Adnexa: normal adnexa and no mass, fullness, tenderness               Rectovaginal: Confirms               Anus:  normal sphincter tone, no lesions  Chaperone present: yes  A:  Well Woman with normal exam  Contraception Paragard IUD due for removal in 2025  Vaginal dryness coconut working well  Pain with intercourse, R/O vaginal infection  Type 2 diabetes with Endocrine management  Smoker, no desire to stop at this point  Screening labs  P:   Reviewed health and wellness pertinent to exam  Warning signs with IUD given and need to advise  Discussed normal exam and trial of coconut oil on spouse to see if resolves. Patient will advise if continues.   Will treat per affirm if indicated  Continue MD follow up  Encouraged cessation  Lab TSH, Vitamin D, Hep C  Pap sumear as above not taken   counseled on breast self exam, mammography screening, adequate intake of calcium and vitamin D, diet and exercise  return annually or prn  An After Visit Summary was printed and given to the patient.

## 2016-02-08 ENCOUNTER — Other Ambulatory Visit: Payer: Self-pay

## 2016-02-08 ENCOUNTER — Telehealth: Payer: Self-pay

## 2016-02-08 DIAGNOSIS — E559 Vitamin D deficiency, unspecified: Secondary | ICD-10-CM

## 2016-02-08 LAB — VITAMIN D 25 HYDROXY (VIT D DEFICIENCY, FRACTURES): Vit D, 25-Hydroxy: 16 ng/mL — ABNORMAL LOW (ref 30–100)

## 2016-02-08 LAB — HEPATITIS C ANTIBODY: HCV Ab: NEGATIVE

## 2016-02-08 MED ORDER — VITAMIN D (ERGOCALCIFEROL) 1.25 MG (50000 UNIT) PO CAPS: 50000.0000 [IU] | ORAL_CAPSULE | ORAL | Status: DC

## 2016-02-08 NOTE — Telephone Encounter (Signed)
[email or web address]

## 2016-02-08 NOTE — Telephone Encounter (Signed)
Patient notified of results. See lab 

## 2016-02-08 NOTE — Telephone Encounter (Signed)
-----   Message from Verner Choleborah S Leonard, CNM sent at 02/08/2016  8:15 AM EDT ----- Notify patient that Vitamin D is very low needs protocol Hep C. Is negative TSH is normal Wet prep negative for yeast, Bv and trichomonas.  We discussed dryness and I feel this is the probable cause. Use coconut oil as discussed and advise if no change

## 2016-02-09 ENCOUNTER — Other Ambulatory Visit: Payer: Self-pay | Admitting: Family

## 2016-02-09 ENCOUNTER — Other Ambulatory Visit: Payer: Self-pay | Admitting: Endocrinology

## 2016-02-11 NOTE — Progress Notes (Signed)
Encounter reviewed [name] Jertson, MD   

## 2016-02-14 ENCOUNTER — Ambulatory Visit (INDEPENDENT_AMBULATORY_CARE_PROVIDER_SITE_OTHER): Payer: BC Managed Care – PPO

## 2016-02-14 ENCOUNTER — Ambulatory Visit (INDEPENDENT_AMBULATORY_CARE_PROVIDER_SITE_OTHER): Payer: BC Managed Care – PPO | Admitting: Podiatry

## 2016-02-14 ENCOUNTER — Encounter: Payer: Self-pay | Admitting: Podiatry

## 2016-02-14 VITALS — BP 136/86 | HR 93 | Resp 16

## 2016-02-14 DIAGNOSIS — M2041 Other hammer toe(s) (acquired), right foot: Secondary | ICD-10-CM | POA: Diagnosis not present

## 2016-02-14 DIAGNOSIS — M79671 Pain in right foot: Secondary | ICD-10-CM

## 2016-02-14 DIAGNOSIS — M779 Enthesopathy, unspecified: Secondary | ICD-10-CM | POA: Diagnosis not present

## 2016-02-14 MED ORDER — TRIAMCINOLONE ACETONIDE 10 MG/ML IJ SUSP
10.0000 mg | Freq: Once | INTRAMUSCULAR | Status: AC
  Administered 2016-02-14: 10 mg

## 2016-02-15 NOTE — Progress Notes (Signed)
Subjective:     Patient ID: [name] Hurley, female   DOB: 21-Jan-1971, 45 y.o.   MRN: [id number]  HPI patient presents with quite a bit of discomfort in the forefoot right with inflammation and fluid around the second metatarsophalangeal joint with lifting of the toe and inflammation within the joint   Review of Systems     Objective:   Physical Exam Neurovascular status intact with elevated second digit right with rigid contracture with discomfort in the second MPJ    Assessment:     Inflammatory capsulitis right with fluid buildup    Plan:     H&P x-rays reviewed and today did proximal nerve block aspirated the joint getting out a small amount of clear fluid and injected with quarter cc dexamethasone Kenalog and applied thick padding and then placed an air fracture walker to completely immobilize and take stress off the foot. Reappoint to recheck again  [name] Hurley reports indicated that there is no signs stress fracture with elevated digits

## 2016-02-26 ENCOUNTER — Other Ambulatory Visit: Payer: Self-pay | Admitting: Endocrinology

## 2016-02-28 ENCOUNTER — Other Ambulatory Visit (INDEPENDENT_AMBULATORY_CARE_PROVIDER_SITE_OTHER): Payer: BC Managed Care – PPO

## 2016-02-28 ENCOUNTER — Other Ambulatory Visit: Payer: Self-pay

## 2016-02-28 DIAGNOSIS — E785 Hyperlipidemia, unspecified: Secondary | ICD-10-CM | POA: Diagnosis not present

## 2016-02-28 DIAGNOSIS — E119 Type 2 diabetes mellitus without complications: Secondary | ICD-10-CM | POA: Diagnosis not present

## 2016-02-28 LAB — LIPID PANEL
CHOL/HDL RATIO: 4
Cholesterol: 137 mg/dL (ref 0–200)
HDL: 33.3 mg/dL — AB (ref >39.00)
LDL CALC: 78 mg/dL (ref 0–99)
NONHDL: 103.6
Triglycerides: 126 mg/dL (ref 0.0–149.0)
VLDL: 25.2 mg/dL (ref 0.0–40.0)

## 2016-02-28 LAB — BASIC METABOLIC PANEL
BUN: 19 mg/dL (ref 6–23)
CALCIUM: 8.9 mg/dL (ref 8.4–10.5)
CHLORIDE: 109 meq/L (ref 96–112)
CO2: 24 meq/L (ref 19–32)
CREATININE: 1.09 mg/dL (ref 0.40–1.20)
GFR: 69.15 mL/min (ref >60.00)
GLUCOSE: 86 mg/dL (ref 70–99)
Potassium: 4.5 mEq/L (ref 3.5–5.1)
SODIUM: 138 meq/L (ref 135–145)

## 2016-02-28 LAB — HEMOGLOBIN A1C: Hgb A1c MFr Bld: 6.7 % — ABNORMAL HIGH (ref 4.6–6.5)

## 2016-02-28 MED ORDER — INSULIN DEGLUDEC 100 UNIT/ML ~~LOC~~ SOPN: 20.0000 [IU] | PEN_INJECTOR | Freq: Every day | SUBCUTANEOUS | 1 refills | Status: DC

## 2016-03-04 ENCOUNTER — Ambulatory Visit (INDEPENDENT_AMBULATORY_CARE_PROVIDER_SITE_OTHER): Payer: BC Managed Care – PPO | Admitting: Endocrinology

## 2016-03-04 ENCOUNTER — Encounter: Payer: Self-pay | Admitting: Endocrinology

## 2016-03-04 VITALS — BP 136/80 | HR 90 | Ht 65.0 in | Wt 181.0 lb

## 2016-03-04 DIAGNOSIS — E119 Type 2 diabetes mellitus without complications: Secondary | ICD-10-CM | POA: Diagnosis not present

## 2016-03-04 NOTE — Patient Instructions (Signed)
Check blood sugars on waking up  3x per week  Also check blood sugars about 2 hours after a meal and do this after different meals by rotation  Recommended blood sugar levels on waking up is 90-130 and about 2 hours after meal is 130-160  Please bring your blood sugar monitor to each visit, thank you  

## 2016-03-04 NOTE — Telephone Encounter (Signed)
Last office note did not need to be sent. [facility] last [facility] exam to be fax to our office for MD to review and be scanned into the pt's chart.

## 2016-03-04 NOTE — Progress Notes (Signed)
Patient ID: [name] Hurley, female   DOB: 13-Nov-1970, 45 y.o.   MRN: [id number]    Reason for Appointment: f/u for Type 2 Diabetes  History of Present Illness:          Diagnosis: Type 2 diabetes mellitus, date of diagnosis: 2000        Past history: She had symptoms of hyperglycemia or diagnoses and a glucose of 302.  She was initially treated with insulin doses tid for 2 yrs Subsequently she was treated with Glucophage and Avandamet In 2003 because of gestational diabetes and hyperglycemia she was treated with premixed insulin Afterwards she was continued on insulin and metformin; she thinks she has been taking Lantus for about 5 years Apparently her diabetes control has been very poor consistently but no records are available. She thinks her A1c was 10% in 2012 and also was high in 2010 She was on 25 units of Lantus without any mealtime insulin for quite some time and her A1c was 11.1% at her initial consultation in 10/2013  Recent history:       She is on a multiple drug regimen of 2 g metformin, Invokana 100 and Victoza 1.2mg  along with her basal insulin of Tresiba 12 units  Current management, blood sugar patterns and problems identified:  She was able to cut back her insulin 4 units since her last visit but not any further  She has not minimal to walk because of foot pain  Has not done any readings after meals as directed, was also told to get one expired test strips on her last visit Has gained back weight although she thinks her diet is fairly good   Oral hypoglycemic drugs the patient is taking are: As above     Side effects from medications have been: None  Glucose monitoring: recently  3x     Glucometer: One Touch ultra mini.      Blood Glucose readings  RECENT range 73-127 with fasting average about 90, lunchtime 127, 112  Hypoglycemia: none     Self-care: The diet that the patient has been following is controlled portions usually  Meals: 2-3 meals per  day. Supper 7 pm          Exercise: She is doing less walking   Dietician visit: Most recent: At diagnosis and 2001.               Compliance with the medical regimen: fair Retinal exam: Most recent: 9/14, no known retinopathy   Microalbumin level: Normal in 2/16   Weight history: 180-220 pounds, highest after pregnancy  Wt Readings from Last 3 Encounters:  03/04/16 181 lb (82.1 kg)  02/07/16 175 lb (79.4 kg)  01/23/16 176 lb (79.8 kg)   Glycemic control:    Lab Results  Component Value Date   HGBA1C 6.7 (H) 02/28/2016   HGBA1C 6.5 01/21/2016   HGBA1C 10.2 (H) 09/12/2015   Lab Results  Component Value Date   MICROALBUR 7.1 (H) 10/16/2015   LDLCALC 78 02/28/2016   CREATININE 1.09 02/28/2016   Lab on 02/28/2016  Component Date Value Ref Range Status  . Hgb A1c MFr Bld 02/28/2016 6.7* 4.6 - 6.5 % Final  . Sodium 02/28/2016 138  135 - 145 mEq/L Final  . Potassium 02/28/2016 4.5  3.5 - 5.1 mEq/L Final  . Chloride 02/28/2016 109  96 - 112 mEq/L Final  . CO2 02/28/2016 24  19 - 32 mEq/L Final  . Glucose, Bld 02/28/2016 86  70 -  99 mg/dL Final  . BUN 40/98/1191 19  6 - 23 mg/dL Final  . Creatinine, Ser 02/28/2016 1.09  0.40 - 1.20 mg/dL Final  . Calcium 47/82/9562 8.9  8.4 - 10.5 mg/dL Final  . GFR 13/02/6577 69.15  >60.00 mL/min Final  . Cholesterol 02/28/2016 137  0 - 200 mg/dL Final  . Triglycerides 02/28/2016 126.0  0.0 - 149.0 mg/dL Final  . HDL 46/96/2952 33.30* >39.00 mg/dL Final  . VLDL 84/13/2440 25.2  0.0 - 40.0 mg/dL Final  . LDL Cholesterol 02/28/2016 78  0 - 99 mg/dL Final  . Total CHOL/HDL Ratio 02/28/2016 4   Final  . NonHDL 02/28/2016 103.60   Final   Other active problems are discussed in review of systems     Medication List       Accurate as of 03/04/16 11:04 AM. Always use your most recent med list.          fluticasone furoate-vilanterol 100-25 MCG/INH Aepb Commonly known as:  BREO ELLIPTA Inhale 1 puff into the lungs daily as needed.     glucose blood test strip Commonly known as:  ONE TOUCH ULTRA TEST Use as instructed to check blood sugar 2 times per day dx code 250.02   Insulin Degludec 100 UNIT/ML Sopn Commonly known as:  TRESIBA FLEXTOUCH Inject 20 Units into the skin daily.   JARDIANCE 10 MG Tabs tablet Generic drug:  empagliflozin TAKE ONE TABLET BY MOUTH ONCE DAILY   losartan 25 MG tablet Commonly known as:  COZAAR TAKE ONE TABLET BY MOUTH ONCE DAILY   metFORMIN 500 MG tablet Commonly known as:  GLUCOPHAGE TAKE TWO TABLETS BY MOUTH TWICE DAILY WITH MEALS   pravastatin 10 MG tablet Commonly known as:  PRAVACHOL TAKE ONE TABLET BY MOUTH ONCE DAILY   VICTOZA 18 MG/3ML Sopn Generic drug:  Liraglutide INJECT 1.2MG  SUBCUTANEOUSLY DAILY AT THE SAME TIME EACH DAY   Vitamin D (Ergocalciferol) 50000 units Caps capsule Commonly known as:  DRISDOL Take 1 capsule (50,000 Units total) by mouth every 7 (seven) days.       Allergies: No Known Allergies  Past Medical History:  Diagnosis Date  . Diabetes mellitus   . Dyspareunia   . Hypertension   . Sciatica     Past Surgical History:  Procedure Laterality Date  . CESAREAN SECTION    . paragard     removal 7-16 & reinsertion 02-13-15    Family History  Problem Relation Age of Onset  . Hypertension Father   . COPD Father   . Emphysema Father   . Diabetes Maternal Aunt   . Heart disease Neg Hx   . Hypertension Mother   . Hyperlipidemia Mother   . Colon cancer Mother   . COPD Paternal Grandfather   . Hypertension Brother     Social History:  reports that she has been smoking Cigarettes.  She has a 22.00 pack-year smoking history. She has never used smokeless tobacco. She reports that she drinks alcohol. She reports that she does not use drugs.    Review of Systems:.  She has throbbing pain in the right second and third toes and was told to have capsulitis by podiatrist but has not improved with glucose steroid injection, wearing a  boot  HYPERTENSION: She had been started on losartan in 2016 by her PCP,  taking only 25 mg No problems with renal function or potassium   Lab Results  Component Value Date   CREATININE 1.09 02/28/2016   BUN  19 02/28/2016   NA 138 02/28/2016   K 4.5 02/28/2016   CL 109 02/28/2016   CO2 24 02/28/2016         Lipids: She has been on low-dose pravastatin for several years, was told this is for cardiovascular prophylaxis   Lab Results  Component Value Date   CHOL 137 02/28/2016   HDL 33.30 (L) 02/28/2016   LDLCALC 78 02/28/2016   LDLDIRECT 87.4 02/27/2014   TRIG 126.0 02/28/2016   CHOLHDL 4 02/28/2016   No recent symptoms of numbness or tingling in her feet    EXAMINATION:   BP 136/80   Pulse 90   Ht 5\' 5"  (1.651 m)   Wt 181 lb (82.1 kg)   LMP 01/18/2016   SpO2 95%   BMI 30.12 kg/m     ASSESSMENT/PLAN  Diabetes type 2 with obesity See history of present illness for detailed discussion of current diabetes management, blood sugar patterns and problems identified  Her A1c is Consistently below 7% She is taking only low-dose insulin currently Although she may be able to taper off her insulin she is not getting any hypoglycemia with the 12 units May be able to reduce further when she started walking again Reminded her to start checking readings after meals also  Hypertension: Blood pressure is normal  LIPIDS: Controlled except low HDL, may improve with weight loss and exercise  Will get report of her eye exam done in March   Patient Instructions  Check blood sugars on waking up  3x per week  Also check blood sugars about 2 hours after a meal and do this after different meals by rotation  Recommended blood sugar levels on waking up is 90-130 and about 2 hours after meal is 130-160  Please bring your blood sugar monitor to each visit, thank you        [facility] CenterKUMAR,Shanara Schnieders 03/04/2016, 11:04 AM   Note: This office note was prepared with Dragon voice recognition  system technology. Any transcriptional errors that result from this process are unintentional.

## 2016-03-04 NOTE — Telephone Encounter (Signed)
Raynelle FanningJulie, Could you print the last office note and then fwd the message back to me. Thanks!

## 2016-03-04 NOTE — Telephone Encounter (Signed)
[facility]. [phone]

## 2016-03-07 ENCOUNTER — Other Ambulatory Visit: Payer: Self-pay

## 2016-03-07 MED ORDER — VICTOZA 18 MG/3ML ~~LOC~~ SOPN: PEN_INJECTOR | SUBCUTANEOUS | 1 refills | Status: DC

## 2016-03-13 ENCOUNTER — Ambulatory Visit: Payer: BC Managed Care – PPO | Admitting: Podiatry

## 2016-03-18 ENCOUNTER — Telehealth: Payer: Self-pay | Admitting: *Deleted

## 2016-03-18 NOTE — Telephone Encounter (Signed)
Pt states she was told by Dr. Charlsie Merlesegal she could work with or without the boot, and she just found out new policy states she can not work in [employer], she would like a note stating the it is her choice not to work in the boot.  I told pt I would have it ready for her to pick up 03/19/2016.

## 2016-03-21 ENCOUNTER — Encounter: Payer: Self-pay | Admitting: Podiatry

## 2016-03-21 NOTE — Progress Notes (Signed)
Patient's requested medical records mailed out Friday 21 March 2016. TFC JMS

## 2016-03-28 ENCOUNTER — Telehealth: Payer: Self-pay | Admitting: *Deleted

## 2016-03-28 NOTE — Telephone Encounter (Addendum)
-----   Message from Darreld McleanJessica M Smith sent at 03/21/2016  4:09 PM EDT ----- Regarding: Name of Surgery Patient stated Dr. Charlsie Merlesegal told her the name of a surgery that she might need done and wants me to let Dr. Lucianne MussKumar know the name of it when I send her Medical Records to him. I did not see anything in the notes. Could either of you please help me and let me know? Thank you. 03/28/2016-Left message explaining the reason I was leaving the message, is because I had to wait for Dr. Charlsie Merlesegal to be in the office to answer the question.  I told pt the procedure would be a 30 minute done outpt, shortening of the bone with screw, and straightening toe.

## 2016-05-10 ENCOUNTER — Other Ambulatory Visit: Payer: Self-pay | Admitting: Endocrinology

## 2016-05-30 ENCOUNTER — Other Ambulatory Visit: Payer: BC Managed Care – PPO

## 2016-06-04 ENCOUNTER — Ambulatory Visit: Payer: BC Managed Care – PPO | Admitting: Endocrinology

## 2016-06-07 ENCOUNTER — Other Ambulatory Visit: Payer: Self-pay | Admitting: Endocrinology

## 2016-08-06 ENCOUNTER — Other Ambulatory Visit: Payer: Self-pay | Admitting: Endocrinology

## 2016-10-08 ENCOUNTER — Other Ambulatory Visit (INDEPENDENT_AMBULATORY_CARE_PROVIDER_SITE_OTHER): Payer: BC Managed Care – PPO

## 2016-10-08 DIAGNOSIS — E119 Type 2 diabetes mellitus without complications: Secondary | ICD-10-CM

## 2016-10-08 LAB — BASIC METABOLIC PANEL
BUN: 17 mg/dL (ref 6–23)
CALCIUM: 8.9 mg/dL (ref 8.4–10.5)
CHLORIDE: 103 meq/L (ref 96–112)
CO2: 25 mEq/L (ref 19–32)
CREATININE: 0.97 mg/dL (ref 0.40–1.20)
GFR: 78.91 mL/min (ref >60.00)
Glucose, Bld: 283 mg/dL — ABNORMAL HIGH (ref 70–99)
Potassium: 4.9 mEq/L (ref 3.5–5.1)
SODIUM: 134 meq/L — AB (ref 135–145)

## 2016-10-08 LAB — HEMOGLOBIN A1C: HEMOGLOBIN A1C: 8.8 % — AB (ref 4.6–6.5)

## 2016-10-10 ENCOUNTER — Encounter: Payer: Self-pay | Admitting: Endocrinology

## 2016-10-10 ENCOUNTER — Other Ambulatory Visit: Payer: Self-pay

## 2016-10-10 ENCOUNTER — Ambulatory Visit (INDEPENDENT_AMBULATORY_CARE_PROVIDER_SITE_OTHER): Payer: BC Managed Care – PPO | Admitting: Endocrinology

## 2016-10-10 VITALS — BP 158/90 | HR 108 | Ht 65.0 in | Wt 189.0 lb

## 2016-10-10 DIAGNOSIS — Z794 Long term (current) use of insulin: Secondary | ICD-10-CM | POA: Diagnosis not present

## 2016-10-10 DIAGNOSIS — E1165 Type 2 diabetes mellitus with hyperglycemia: Secondary | ICD-10-CM

## 2016-10-10 MED ORDER — METFORMIN HCL 1000 MG PO TABS: 1000.0000 mg | ORAL_TABLET | Freq: Two times a day (BID) | ORAL | 3 refills | Status: DC

## 2016-10-10 MED ORDER — VICTOZA 18 MG/3ML ~~LOC~~ SOPN: PEN_INJECTOR | SUBCUTANEOUS | 1 refills | Status: DC

## 2016-10-10 MED ORDER — EMPAGLIFLOZIN 10 MG PO TABS: 10.0000 mg | ORAL_TABLET | Freq: Every day | ORAL | 3 refills | Status: DC

## 2016-10-10 MED ORDER — INSULIN DEGLUDEC 100 UNIT/ML ~~LOC~~ SOPN: 20.0000 [IU] | PEN_INJECTOR | Freq: Every day | SUBCUTANEOUS | 1 refills | Status: DC

## 2016-10-10 MED ORDER — GLUCOSE BLOOD VI STRP: ORAL_STRIP | 2 refills | Status: DC

## 2016-10-10 NOTE — Patient Instructions (Addendum)
Victoza 0.6mg  for at least 2-3 days  Tresiba 20 units daily to start

## 2016-10-10 NOTE — Progress Notes (Signed)
Patient ID: [name] Hurley, female   DOB: October 10, 1970, 46 y.o.   MRN: [id number]    Reason for Appointment: f/u for Type 2 Diabetes  History of Present Illness:          Diagnosis: Type 2 diabetes mellitus, date of diagnosis: 2000        Past history: She had symptoms of hyperglycemia or diagnoses and a glucose of 302.  She was initially treated with insulin doses tid for 2 yrs Subsequently she was treated with Glucophage and Avandamet In 2003 because of gestational diabetes and hyperglycemia she was treated with premixed insulin Afterwards she was continued on insulin and metformin; she thinks she has been taking Lantus for about 5 years Apparently her diabetes control has been very poor consistently but no records are available. She thinks her A1c was 10% in 2012 and also was high in 2010 She was on 25 units of Lantus without any mealtime insulin for quite some time and her A1c was 11.1% at her initial consultation in 10/2013  Recent history:       She had been on a multiple drug regimen of 2 g metformin, Invokana 100 and Victoza 1.2mg  along with her basal insulin of Tresiba 12 units  Her A1c is now 8.8%, previously 6.7 This is from running out of for all her medications except metformin  Current management, blood sugar patterns and problems identified:  She has not had any test strips to monitor sugar, lab glucose was 283  Despite high sugars she has actually gained weight from her not taking Invokana and Victoza, diet has been erratic  No symptoms of high blood sugars currently Also has not done any walking for exercise   Oral hypoglycemic drugs the patient is taking are: As above     Side effects from medications have been: None  Glucose monitoring: recently  0     Glucometer: One Touch ultra mini.      Blood Glucose readings not available   Hypoglycemia: none     Self-care: The diet that the patient has been following is controlled portions usually  Meals:  2-3 meals per day. Supper 7 pm          Exercise: She is doing no walking    Dietician visit: Most recent: At diagnosis and 2001.               Compliance with the medical regimen: fair Retinal exam: Most recent: 9/14, no known retinopathy   Microalbumin level: Normal in 2/16   Weight history: 180-220 pounds, highest after pregnancy  Wt Readings from Last 3 Encounters:  10/10/16 189 lb (85.7 kg)  03/04/16 181 lb (82.1 kg)  02/07/16 175 lb (79.4 kg)   Glycemic control:    Lab Results  Component Value Date   HGBA1C 8.8 (H) 10/08/2016   HGBA1C 6.7 (H) 02/28/2016   HGBA1C 6.5 01/21/2016   Lab Results  Component Value Date   MICROALBUR 7.1 (H) 10/16/2015   LDLCALC 78 02/28/2016   CREATININE 0.97 10/08/2016   Lab on 10/08/2016  Component Date Value Ref Range Status  . Hgb A1c MFr Bld 10/08/2016 8.8* 4.6 - 6.5 % Final  . Sodium 10/08/2016 134* 135 - 145 mEq/L Final  . Potassium 10/08/2016 4.9  3.5 - 5.1 mEq/L Final  . Chloride 10/08/2016 103  96 - 112 mEq/L Final  . CO2 10/08/2016 25  19 - 32 mEq/L Final  . Glucose, Bld 10/08/2016 283* 70 - 99 mg/dL  Final  . BUN 10/08/2016 17  6 - 23 mg/dL Final  . Creatinine, Ser 10/08/2016 0.97  0.40 - 1.20 mg/dL Final  . Calcium 62/13/0865 8.9  8.4 - 10.5 mg/dL Final  . GFR 78/46/9629 78.91  >60.00 mL/min Final   Other active problems are discussed in review of systems   Allergies as of 10/10/2016   No Known Allergies     Medication List       Accurate as of 10/10/16  2:22 PM. Always use your most recent med list.          fluticasone furoate-vilanterol 100-25 MCG/INH Aepb Commonly known as:  BREO ELLIPTA Inhale 1 puff into the lungs daily as needed.   glucose blood test strip Commonly known as:  ONE TOUCH ULTRA TEST Use as instructed to check blood sugar 2 times per day dx code 250.02   insulin degludec 100 UNIT/ML Sopn FlexTouch Pen Commonly known as:  TRESIBA FLEXTOUCH Inject 20 Units into the skin daily.     JARDIANCE 10 MG Tabs tablet Generic drug:  empagliflozin TAKE ONE TABLET BY MOUTH ONCE DAILY   losartan 25 MG tablet Commonly known as:  COZAAR TAKE ONE TABLET BY MOUTH ONCE DAILY   metFORMIN 500 MG tablet Commonly known as:  GLUCOPHAGE TAKE TWO TABLETS BY MOUTH TWICE DAILY WITH MEALS   pravastatin 10 MG tablet Commonly known as:  PRAVACHOL TAKE ONE TABLET BY MOUTH ONCE DAILY   VICTOZA 18 MG/3ML Sopn Generic drug:  liraglutide INJECT 1.2MG  SUBCUTANEOUSLY DAILY AT THE SAME TIME EACH DAY   Vitamin D (Ergocalciferol) 50000 units Caps capsule Commonly known as:  DRISDOL Take 1 capsule (50,000 Units total) by mouth every 7 (seven) days.       Allergies: No Known Allergies  Past Medical History:  Diagnosis Date  . Diabetes mellitus   . Dyspareunia   . Hypertension   . Sciatica     Past Surgical History:  Procedure Laterality Date  . CESAREAN SECTION    . paragard     removal 7-16 & reinsertion 02-13-15    Family History  Problem Relation Age of Onset  . Hypertension Father   . COPD Father   . Emphysema Father   . Diabetes Maternal Aunt   . Heart disease Neg Hx   . Hypertension Mother   . Hyperlipidemia Mother   . Colon cancer Mother   . COPD Paternal Grandfather   . Hypertension Brother     Social History:  reports that she has been smoking Cigarettes.  She has a 22.00 pack-year smoking history. She has never used smokeless tobacco. She reports that she drinks alcohol. She reports that she does not use drugs.    Review of Systems:.  She has throbbing pain in the right second and third toes and was told to have capsulitis by podiatrist but has not improved with glucose steroid injection, wearing a boot  HYPERTENSION: She had been started on losartan in 2016 by her PCP She has not taken this recently No problems with renal function or potassium   Lab Results  Component Value Date   CREATININE 0.97 10/08/2016   BUN 17 10/08/2016   NA 134 (L) 10/08/2016    K 4.9 10/08/2016   CL 103 10/08/2016   CO2 25 10/08/2016         Lipids: She has been on low-dose pravastatin for several years, was told this is for cardiovascular prophylaxis Has not taken any recently, ran out of prescriptions  Lab Results  Component Value Date   CHOL 137 02/28/2016   HDL 33.30 (L) 02/28/2016   LDLCALC 78 02/28/2016   LDLDIRECT 87.4 02/27/2014   TRIG 126.0 02/28/2016   CHOLHDL 4 02/28/2016      EXAMINATION:   BP (!) 158/90   Pulse (!) 108   Ht 5\' 5"  (1.651 m)   Wt 189 lb (85.7 kg)   SpO2 98%   BMI 31.45 kg/m     ASSESSMENT/PLAN  Diabetes type 2 with obesity See history of present illness for detailed discussion of current diabetes management, blood sugar patterns and problems identified  Her blood sugars are totally out of control with her running out of her medications and recent glucose 283 She has not checked her sugar also and without the Victoza appears to be gaining weight  Her blood sugars have not been monitored at home and she is out of test strips She also has not done well with diet or exercise For now will resume on her previous medications as before except she is now on Jardiance because of insurance not covering Invokana She will try to use only 0.6 Victoza for the first few days to avoid any potential nausea  Hypertension: Blood pressure is higher than usual with her not taking Invokana and losartan, she will start back  LIPIDS: Need follow-up on the next visit, currently not on her pravastatin  Also advised her to wait until sugars are normal and stable to get her eye exam done  Patient Instructions  Victoza 0.6mg  for at least 2-3 days  Tresiba 20 units daily to start     [facility] 10/10/2016, 2:22 PM   Note: This office note was prepared with Insurance underwriter. Any transcriptional errors that result from this process are unintentional.

## 2016-10-28 ENCOUNTER — Other Ambulatory Visit: Payer: BC Managed Care – PPO

## 2016-10-31 ENCOUNTER — Ambulatory Visit: Payer: BC Managed Care – PPO | Admitting: Endocrinology

## 2016-11-14 ENCOUNTER — Other Ambulatory Visit: Payer: BC Managed Care – PPO

## 2016-11-16 ENCOUNTER — Other Ambulatory Visit: Payer: Self-pay | Admitting: Family

## 2016-11-19 ENCOUNTER — Ambulatory Visit: Payer: BC Managed Care – PPO | Admitting: Endocrinology

## 2016-12-02 ENCOUNTER — Other Ambulatory Visit: Payer: Self-pay | Admitting: Family

## 2017-02-10 ENCOUNTER — Encounter: Payer: Self-pay | Admitting: Certified Nurse Midwife

## 2017-02-10 ENCOUNTER — Other Ambulatory Visit (HOSPITAL_COMMUNITY)
Admission: RE | Admit: 2017-02-10 | Discharge: 2017-02-10 | Disposition: A | Discharge status: Home / Self Care | Payer: BC Managed Care – PPO | Source: Ambulatory Visit | Attending: Obstetrics & Gynecology | Admitting: Obstetrics & Gynecology

## 2017-02-10 ENCOUNTER — Ambulatory Visit: Payer: BC Managed Care – PPO | Admitting: Certified Nurse Midwife

## 2017-02-10 VITALS — BP 120/70 | HR 76 | Resp 16 | Ht 64.5 in | Wt 175.0 lb

## 2017-02-10 DIAGNOSIS — Z01419 Encounter for gynecological examination (general) (routine) without abnormal findings: Secondary | ICD-10-CM | POA: Insufficient documentation

## 2017-02-10 DIAGNOSIS — Z124 Encounter for screening for malignant neoplasm of cervix: Secondary | ICD-10-CM | POA: Diagnosis not present

## 2017-02-10 NOTE — Progress Notes (Signed)
46 y.o. G1P1001 Married  African American Fe here for annual exam. Sees Dr. Lucianne Muss for follow up every 3 months for diabetes management. Staying stable at present. Also manages cholesterol. Periods regular no cramping, 2-3 days, moderate essentially no change. No health issues today.   No LMP recorded.          Sexually active: Yes.    The current method of family planning is IUD.    Exercising: Yes.    walking Smoker:  yes  Health Maintenance: Pap:  12-20-14 neg HPV HR neg History of Abnormal Pap: no MMG:  02-22-15 category a density birads 1:neg Self Breast exams: yes Colonoscopy:  2005 neg per patient BMD:   none TDaP: more than 10 years, declines today again Shingles: no Pneumonia: had done Hep C and HIV: HIV neg yrs ago. Hep c neg 2017 Labs: none   reports that she has been smoking Cigarettes.  She has a 22.00 pack-year smoking history. She has never used smokeless tobacco. She reports that she drinks alcohol. She reports that she does not use drugs.  Past Medical History:  Diagnosis Date  . Diabetes mellitus   . Dyspareunia   . Hypertension   . Sciatica     Past Surgical History:  Procedure Laterality Date  . CESAREAN SECTION    . paragard     removal 7-16 & reinsertion 02-13-15    Current Outpatient Prescriptions  Medication Sig Dispense Refill  . Fluticasone Furoate-Vilanterol (BREO ELLIPTA) 100-25 MCG/INH AEPB Inhale 1 puff into the lungs daily as needed. 28 each 0  . glucose blood (ONE TOUCH ULTRA TEST) test strip Use as instructed to check blood sugar 2 times per day dx code 250.02 100 each 2  . insulin degludec (TRESIBA FLEXTOUCH) 100 UNIT/ML SOPN FlexTouch Pen Inject 0.2 mLs (20 Units total) into the skin daily. 5 pen 1  . losartan (COZAAR) 25 MG tablet TAKE ONE TABLET BY MOUTH ONCE DAILY 30 tablet 3  . metFORMIN (GLUCOPHAGE) 1000 MG tablet Take 1 tablet (1,000 mg total) by mouth 2 (two) times daily with a meal. 180 tablet 3  . pravastatin (PRAVACHOL) 10 MG  tablet TAKE ONE TABLET BY MOUTH ONCE DAILY 30 tablet 5  . VICTOZA 18 MG/3ML SOPN INJECT 1.2MG  SUBCUTANEOUSLY DAILY AT THE SAME TIME EACH DAY 18 mL 1  . Vitamin D, Ergocalciferol, (DRISDOL) 50000 units CAPS capsule Take 1 capsule (50,000 Units total) by mouth every 7 (seven) days. 12 capsule 0   No current facility-administered medications for this visit.     Family History  Problem Relation Age of Onset  . Hypertension Father   . COPD Father   . Emphysema Father   . Diabetes Maternal Aunt   . Heart disease Neg Hx   . Hypertension Mother   . Hyperlipidemia Mother   . Colon cancer Mother   . COPD Paternal Grandfather   . Hypertension Brother     ROS:  Pertinent items are noted in HPI.  Otherwise, a comprehensive ROS was negative.  Exam:   There were no vitals taken for this visit.   Ht Readings from Last 3 Encounters:  10/10/16 5\' 5"  (1.651 m)  03/04/16 5\' 5"  (1.651 m)  02/07/16 5' 4.75" (1.645 m)    General appearance: alert, cooperative and appears stated age Head: Normocephalic, without obvious abnormality, atraumatic Neck: no adenopathy, supple, symmetrical, trachea midline and thyroid normal to inspection and palpation Lungs: clear to auscultation bilaterally Breasts: normal appearance, no masses or tenderness,  No nipple retraction or dimpling, No nipple discharge or bleeding, No axillary or supraclavicular adenopathy Heart: regular rate and rhythm Abdomen: soft, non-tender; no masses,  no organomegaly Extremities: extremities normal, atraumatic, no cyanosis or edema Skin: Skin color, texture, turgor normal. No rashes or lesions Lymph nodes: Cervical, supraclavicular, and axillary nodes normal. No abnormal inguinal nodes palpated Neurologic: Grossly normal   Pelvic: External genitalia:  no lesions              Urethra:  normal appearing urethra with no masses, tenderness or lesions              Bartholin's and Skene's: normal                 Vagina: normal appearing  vagina with normal color and discharge, no lesions              Cervix: multiparous appearance, no cervical motion tenderness and no lesions              Pap taken: Yes.   Bimanual Exam:  Uterus:  normal size, contour, position, consistency, mobility, non-tender              Adnexa: normal adnexa and no mass, fullness, tenderness               Rectovaginal: Confirms               Anus:  normal sphincter tone, no lesions  Chaperone present: yes  A:  Well Woman with normal exam  Contraception  Mirena IUD 01/2020 removal  Insulin dependent Type 1 Diabetic in good control with PCP management  Smoker has cut back but does not want to work on cessation.    P:   Reviewed health and wellness pertinent to exam  Reviewed warning signs of IUD and need to advise  Continue follow up with PCP as indicated  Pap smear: yes   counseled on breast self exam, STD prevention, HIV risk factors and prevention, adequate intake of calcium and vitamin D, diet and exercise  return annually or prn  An After Visit Summary was printed and given to the patient.

## 2017-02-10 NOTE — Patient Instructions (Signed)

## 2017-02-11 LAB — CYTOLOGY - PAP: Diagnosis: NEGATIVE

## 2017-09-10 LAB — HM DIABETES EYE EXAM

## 2017-09-11 LAB — HM DIABETES EYE EXAM

## 2017-10-22 ENCOUNTER — Encounter: Payer: Self-pay | Admitting: Endocrinology

## 2017-11-19 ENCOUNTER — Encounter: Payer: Self-pay | Admitting: Family Medicine

## 2017-11-19 ENCOUNTER — Ambulatory Visit: Payer: BC Managed Care – PPO | Admitting: Family Medicine

## 2017-11-19 VITALS — BP 130/96 | HR 88 | Ht 64.75 in | Wt 186.8 lb

## 2017-11-19 DIAGNOSIS — I10 Essential (primary) hypertension: Secondary | ICD-10-CM

## 2017-11-19 DIAGNOSIS — E785 Hyperlipidemia, unspecified: Secondary | ICD-10-CM

## 2017-11-19 DIAGNOSIS — Z23 Encounter for immunization: Secondary | ICD-10-CM | POA: Diagnosis not present

## 2017-11-19 DIAGNOSIS — F17210 Nicotine dependence, cigarettes, uncomplicated: Secondary | ICD-10-CM | POA: Insufficient documentation

## 2017-11-19 DIAGNOSIS — Z794 Long term (current) use of insulin: Secondary | ICD-10-CM | POA: Diagnosis not present

## 2017-11-19 DIAGNOSIS — M7501 Adhesive capsulitis of right shoulder: Secondary | ICD-10-CM

## 2017-11-19 DIAGNOSIS — M7541 Impingement syndrome of right shoulder: Secondary | ICD-10-CM | POA: Insufficient documentation

## 2017-11-19 DIAGNOSIS — E119 Type 2 diabetes mellitus without complications: Secondary | ICD-10-CM

## 2017-11-19 LAB — LIPID PANEL
CHOL/HDL RATIO: 7
CHOLESTEROL: 230 mg/dL — AB (ref 0–200)
HDL: 33.5 mg/dL — ABNORMAL LOW (ref >39.00)
NONHDL: 196.48
TRIGLYCERIDES: 290 mg/dL — AB (ref 0.0–149.0)
VLDL: 58 mg/dL — AB (ref 0.0–40.0)

## 2017-11-19 LAB — MICROALBUMIN / CREATININE URINE RATIO
Creatinine,U: 67.3 mg/dL
Microalb Creat Ratio: 172.6 mg/g — ABNORMAL HIGH (ref 0.0–30.0)
Microalb, Ur: 116.2 mg/dL — ABNORMAL HIGH (ref 0.0–1.9)

## 2017-11-19 LAB — COMPREHENSIVE METABOLIC PANEL
ALBUMIN: 3.9 g/dL (ref 3.5–5.2)
ALT: 8 U/L (ref 0–35)
AST: 14 U/L (ref 0–37)
Alkaline Phosphatase: 63 U/L (ref 39–117)
BUN: 21 mg/dL (ref 6–23)
CALCIUM: 9.3 mg/dL (ref 8.4–10.5)
CHLORIDE: 103 meq/L (ref 96–112)
CO2: 23 meq/L (ref 19–32)
Creatinine, Ser: 0.92 mg/dL (ref 0.40–1.20)
GFR: 83.48 mL/min (ref >60.00)
Glucose, Bld: 235 mg/dL — ABNORMAL HIGH (ref 70–99)
POTASSIUM: 4.6 meq/L (ref 3.5–5.1)
Sodium: 135 mEq/L (ref 135–145)
Total Bilirubin: 0.2 mg/dL (ref 0.2–1.2)
Total Protein: 6.8 g/dL (ref 6.0–8.3)

## 2017-11-19 LAB — LDL CHOLESTEROL, DIRECT: Direct LDL: 141 mg/dL

## 2017-11-19 LAB — HEMOGLOBIN A1C: Hgb A1c MFr Bld: 9.3 % — ABNORMAL HIGH (ref 4.6–6.5)

## 2017-11-19 MED ORDER — MELOXICAM 15 MG PO TABS: 15.0000 mg | ORAL_TABLET | Freq: Every day | ORAL | 1 refills | Status: DC

## 2017-11-19 MED ORDER — PRAVASTATIN SODIUM 10 MG PO TABS: 10.0000 mg | ORAL_TABLET | Freq: Every day | ORAL | 2 refills | Status: DC

## 2017-11-19 MED ORDER — JARDIANCE 10 MG PO TABS: 10.0000 mg | ORAL_TABLET | Freq: Every day | ORAL | 6 refills | Status: DC

## 2017-11-19 MED ORDER — LIRAGLUTIDE 18 MG/3ML ~~LOC~~ SOPN: PEN_INJECTOR | SUBCUTANEOUS | 1 refills | Status: DC

## 2017-11-19 MED ORDER — INSULIN DEGLUDEC 100 UNIT/ML ~~LOC~~ SOPN: 20.0000 [IU] | PEN_INJECTOR | Freq: Every day | SUBCUTANEOUS | 1 refills | Status: DC

## 2017-11-19 MED ORDER — METFORMIN HCL 1000 MG PO TABS: 1000.0000 mg | ORAL_TABLET | Freq: Two times a day (BID) | ORAL | 3 refills | Status: DC

## 2017-11-19 MED ORDER — LOSARTAN POTASSIUM 25 MG PO TABS: 25.0000 mg | ORAL_TABLET | Freq: Every day | ORAL | 2 refills | Status: DC

## 2017-11-19 NOTE — Assessment & Plan Note (Signed)
Blood pressure noted to be elevated today. Restart losartan Counseled on low-salt diet Update renal function.

## 2017-11-19 NOTE — Assessment & Plan Note (Signed)
Exam consistent with adhesive capsulitis. Discussed treatment options including injection, anti-inflammatories and physical therapy. She declines injection at this time. We will prescribe meloxicam with referral to physical therapy.  Discussed that she may call to schedule injection if she desires.

## 2017-11-19 NOTE — Assessment & Plan Note (Signed)
Prior treatment with pravastatin, tolerated well. Has risk factors for cardiovascular disease including diabetes and smoking, will restart pravastatin. Update lipid panel and LFTs.

## 2017-11-19 NOTE — Assessment & Plan Note (Addendum)
Previously followed by endocrinology, would prefer PCP to manage at this time. Restart prior medications. Instructed to check blood sugars regularly. Recommend follow low-carb diet and working on incorporation of regular exercise. Pneumovax 23 given today.

## 2017-11-19 NOTE — Patient Instructions (Signed)
It was very nice to meet you today Restart your medications for diabetes and monitor your blood sugars at home I have placed a consult for physical therapy, if you decide that you want to have an injection please let me know.  You may use meloxicam in place of ibuprofen for pain if needed (note that this medication is once daily) If you would like help with quitting smoking please let me know.    Adhesive Capsulitis Adhesive capsulitis is inflammation of the tendons and ligaments that surround the shoulder joint (shoulder capsule). This condition causes the shoulder to become stiff and painful to move. Adhesive capsulitis is also called frozen shoulder. What are the causes? This condition may be caused by:  An injury to the shoulder joint.  Straining the shoulder.  Not moving the shoulder for a period of time. This can happen if your arm was injured or in a sling.  Long-standing health problems, such as: ? Diabetes. ? Thyroid problems. ? Heart disease. ? Stroke. ? Rheumatoid arthritis. ? Lung disease.  In some cases, the cause may not be known. What increases the risk? This condition is more likely to develop in:  Women.  People who are older than 49 years of age.  What are the signs or symptoms? Symptoms of this condition include:  Pain in the shoulder when moving the arm. There may also be pain when parts of the shoulder are touched. The pain is worse at night or when at rest.  Soreness or aching in the shoulder.  Inability to move the shoulder normally.  Muscle spasms.  How is this diagnosed? This condition is diagnosed with a physical exam and imaging tests, such as an X-ray or MRI. How is this treated? This condition may be treated with:  Treatment of the underlying cause or condition.  Physical therapy. This involves performing exercises to get the shoulder moving again.  Medicine. Medicine may be given to relieve pain, inflammation, or muscle  spasms.  Steroid injections into the shoulder joint.  Shoulder manipulation. This is a procedure to move the shoulder into another position. It is done after you are given a medicine to make you fall asleep (general anesthetic). The joint may also be injected with salt water at high pressure to break down scarring.  Surgery. This may be done in severe cases when other treatments have failed.  Although most people recover completely from adhesive capsulitis, some may not regain the full movement of the shoulder. Follow these instructions at home:  Take over-the-counter and prescription medicines only as told by your health care provider.  If you are being treated with physical therapy, follow instructions from your physical therapist.  Avoid exercises that put a lot of demand on your shoulder, such as throwing. These exercises can make pain worse.  If directed, apply ice to the injured area: ? Put ice in a plastic bag. ? Place a towel between your skin and the bag. ? Leave the ice on for 20 minutes, 2-3 times per day. Contact a health care provider if:  You develop new symptoms.  Your symptoms get worse. This information is not intended to replace advice given to you by your health care provider. Make sure you discuss any questions you have with your health care provider. Document Released: 05/11/2009 Document Revised: 12/20/2015 Document Reviewed: 11/06/2014 Elsevier Interactive Patient Education  [employer]2018 Elsevier Inc.

## 2017-11-19 NOTE — Progress Notes (Signed)
[name] Hurley - 47 y.o. female MRN [id number]  Date of birth: 09-17-1970  Subjective Chief Complaint  Patient presents with  . Medication Refill  . Shoulder Pain    started 3 months ago     HPI [name] Canaryracey Dyar is a 47 y.o. female here today to establish care with new pcp.  She is following up on her chronic medical conditions and would like new problem of shoulder pain evaluated.   -T2DM: History of type 2 diabetes, diagnosed around the year 2000.  Was previously followed by endocrinology for management of her diabetes and prescribed Tresiba, Jardiance, metformin and Victoza.  She has been out of these medications for a few months, she did have metformin up until a couple weeks ago.  Blood sugar averaging in the 300s previously when she was checking however has not checked her blood sugars in several weeks as well.  When she was taking all of her prescribed medications she tells me that her blood sugars were around 97-100 fasting.  Overall she tells me that she feels well, she has some polyuria but denies fatigue, polydipsia or vision changes.  She does not have any neuropathic symptoms.  -Nicotine dependence: Currently smoking approximately 1/2 pack/day.  She has been able to quit for up to 4 months in the past with Chantix however she is not interested in quitting at this time.  -Dyslipidemia: Treatment with pravastatin however has been out of medication for several months.  Tolerated well previously without myalgias.  -Shoulder pain: Complained of right shoulder pain.  She has never been seen for this problem in the past.  She tells me that pain began approximately 3 months ago is kind of a dull ache after waking up however has progressed to increased pain and stiffness.  She denies any prior injury or known overuse.  Pain is to the point where she has difficulty raising the arm.  She has gotten some relief with 800 mg ibuprofen and/or Goody powders.  She denies any numbness, tingling or weakness  in the hand.  She does not have any associated neck pain.  ROS:  ROS completed and negative, except as noted in HPI  No Known Allergies  Past Medical History:  Diagnosis Date  . Diabetes mellitus   . Dyspareunia   . Hypertension   . Sciatica     Past Surgical History:  Procedure Laterality Date  . CESAREAN SECTION    . paragard     removal 7-16 & reinsertion 02-13-15    Social History   Socioeconomic History  . Marital status: Married    Spouse name: Not on file  . Number of children: 4  . Years of education: 5214  . Highest education level: Not on file  Occupational History  . Occupation: Nutritition   Social Needs  . Financial resource strain: Not on file  . Food insecurity:    Worry: Not on file    Inability: Not on file  . Transportation needs:    Medical: Not on file    Non-medical: Not on file  Tobacco Use  . Smoking status: Current Every Day Smoker    Packs/day: 1.00    Years: 22.00    Pack years: 22.00    Types: Cigarettes  . Smokeless tobacco: Never Used  Substance and Sexual Activity  . Alcohol use: Yes    Alcohol/week: 0.0 oz    Comment: only during summer time  . Drug use: No  . Sexual activity: Yes  Partners: Male    Birth control/protection: IUD  Lifestyle  . Physical activity:    Days per week: Not on file    Minutes per session: Not on file  . Stress: Not on file  Relationships  . Social connections:    Talks on phone: Not on file    Gets together: Not on file    Attends religious service: Not on file    Active member of club or organization: Not on file    Attends meetings of clubs or organizations: Not on file    Relationship status: Not on file  Other Topics Concern  . Not on file  Social History Narrative   Born and raised in [place]. Fun: Sleep   Denies any religious beliefs effecting health care.     Family History  Problem Relation Age of Onset  . Hypertension Father   . COPD Father   . Emphysema Father   . Diabetes  Maternal Aunt   . Hypertension Mother   . Hyperlipidemia Mother   . Colon cancer Mother   . COPD Paternal Grandfather   . Hypertension Brother   . Heart disease Neg Hx     Health Maintenance  Topic Date Due  . PNEUMOCOCCAL POLYSACCHARIDE VACCINE (1) 01/05/1971  . HIV Screening  01/05/1984  . TETANUS/TDAP  01/05/1988  . FOOT EXAM  01/22/2017  . HEMOGLOBIN A1C  04/10/2017  . INFLUENZA VACCINE  02/25/2018  . OPHTHALMOLOGY EXAM  09/10/2018  . PAP SMEAR  02/11/2020    ----------------------------------------------------------------------------------------------------------------------------------------------------- Physical Exam BP (!) 130/96 (BP Location: Right Arm, Patient Position: Sitting, Cuff Size: Normal)   Pulse 88   Ht 5' 4.75" (1.645 m)   Wt 186 lb 12.8 oz (84.7 kg)   BMI 31.33 kg/m   Physical Exam  Constitutional: She is oriented to person, place, and time. She appears well-nourished. No distress.  HENT:  Head: Normocephalic and atraumatic.  Mouth/Throat: Oropharynx is clear and moist.  Eyes: No scleral icterus.  Neck: Neck supple. No thyromegaly present.  Cardiovascular: Normal rate, regular rhythm and normal heart sounds.  Pulses:      Dorsalis pedis pulses are 2+ on the right side, and 2+ on the left side.       Posterior tibial pulses are 2+ on the right side, and 2+ on the left side.  Pulmonary/Chest: Effort normal.  Musculoskeletal:       Right foot: There is normal range of motion and no deformity.       Left foot: There is normal range of motion and no deformity.  Right shoulder with mild ttp over the glenoid.  ROM is quite limited in all planes and stiffness with PROM.  Strength is difficult to test due to pain.  No drop arm noted.   Feet:  Right Foot:  Protective Sensation: 4 sites tested. 4 sites sensed.  Skin Integrity: Negative for ulcer, blister, skin breakdown, erythema, warmth, callus or dry skin.  Left Foot:  Protective Sensation: 4 sites  tested. 4 sites sensed.  Skin Integrity: Negative for ulcer, blister, skin breakdown, erythema, warmth, callus or dry skin.  Lymphadenopathy:    She has no cervical adenopathy.  Neurological: She is alert and oriented to person, place, and time.  Skin: No rash noted.  Psychiatric: She has a normal mood and affect. Her behavior is normal.      ------------------------------------------------------------------------------------------------------------------------------------------------------------------------------------------------------------------- Assessment and Plan  Essential hypertension Blood pressure noted to be elevated today. Restart losartan Counseled on low-salt diet Update renal  function.  Type 2 diabetes mellitus without complication, with long-term current use of insulin (HCC) Previously followed by endocrinology, would prefer PCP to manage at this time. Restart prior medications. Instructed to check blood sugars regularly. Recommend follow low-carb diet and working on incorporation of regular exercise. Pneumovax 23 given today.  Adhesive capsulitis of right shoulder Exam consistent with adhesive capsulitis. Discussed treatment options including injection, anti-inflammatories and physical therapy. She declines injection at this time. We will prescribe meloxicam with referral to physical therapy.  Discussed that she may call to schedule injection if she desires.  Cigarette nicotine dependence without complication Approximately 5 minutes spent counseling patient on quitting smoking. Offered smoking cessation materials as well as medications, however she declines at this time. Discussed that she may call back and let me know she is interested in any medications or materials for smoking cessation.  Dyslipidemia Prior treatment with pravastatin, tolerated well. Has risk factors for cardiovascular disease including diabetes and smoking, will restart  pravastatin. Update lipid panel and LFTs.

## 2017-11-19 NOTE — Assessment & Plan Note (Signed)
Approximately 5 minutes spent counseling patient on quitting smoking. Offered smoking cessation materials as well as medications, however she declines at this time. Discussed that she may call back and let me know she is interested in any medications or materials for smoking cessation.

## 2017-11-30 ENCOUNTER — Encounter: Payer: Self-pay | Admitting: Family Medicine

## 2018-01-06 ENCOUNTER — Ambulatory Visit: Payer: BC Managed Care – PPO | Attending: Family Medicine | Admitting: Physical Therapy

## 2018-01-06 ENCOUNTER — Encounter: Payer: Self-pay | Admitting: Physical Therapy

## 2018-01-06 DIAGNOSIS — M25511 Pain in right shoulder: Secondary | ICD-10-CM | POA: Diagnosis not present

## 2018-01-06 DIAGNOSIS — M62838 Other muscle spasm: Secondary | ICD-10-CM | POA: Diagnosis present

## 2018-01-06 DIAGNOSIS — M25611 Stiffness of right shoulder, not elsewhere classified: Secondary | ICD-10-CM | POA: Insufficient documentation

## 2018-01-06 NOTE — Therapy (Signed)
[facility] [address] [place], [place], [address] Phone: [phone]   Fax:  [phone]  Physical Therapy Evaluation  Patient Details  Name: [name] Hurley MRN: [id number] Date of Birth: Jul 30, 1970 Referring Provider: Elmarie Shiley   Encounter Date: 01/06/2018  PT End of Session - 01/06/18 1603    Visit Number  1    Date for PT Re-Evaluation  03/08/18    PT Start Time  1530    PT Stop Time  1630    PT Time Calculation (min)  60 min    Activity Tolerance  Patient tolerated treatment well    Behavior During Therapy  [facility] for tasks assessed/performed       Past Medical History:  Diagnosis Date  . Diabetes mellitus   . Dyspareunia   . Hypertension   . Sciatica     Past Surgical History:  Procedure Laterality Date  . CESAREAN SECTION    . paragard     removal 7-16 & reinsertion 02-13-15    There were no vitals filed for this visit.   Subjective Assessment - 01/06/18 1540    Subjective  Patient reports that over the past 8-9 months she has had increased right shoulder pain and difficulty with ROM.  No imaging has been done, reports that she is diabetic and right handed.    Limitations  House hold activities    Patient Stated Goals  have better motion, less pain    Currently in Pain?  Yes    Pain Score  5     Pain Location  Shoulder    Pain Orientation  Right    Pain Descriptors / Indicators  Sharp    Pain Type  Acute pain    Pain Radiating Towards  denies    Pain Onset  More than a month ago    Pain Frequency  Constant    Aggravating Factors   with reaching, doing hair, dressing pain up to 10/10    Pain Relieving Factors  reports nothing helps at best a 5/10    Effect of Pain on Daily Activities  difficulty with ADL's. dressing and doing hair         [facility] PT Assessment - 01/06/18 0001      Assessment   Medical Diagnosis  right shoulder pain, adhesive capsulitis    Referring Provider  Elmarie Shiley    Onset Date/Surgical Date  12/06/17    Hand Dominance  Right    Prior Therapy  no      Precautions   Precautions  None      Balance Screen   Has the patient fallen in the past 6 months  No    Has the patient had a decrease in activity level because of a fear of falling?   No    Is the patient reluctant to leave their home because of a fear of falling?   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#    Leisure  walks      Posture/Postural Control   Posture Comments  fwd head, rounded shoulders      ROM / Strength   AROM / PROM / Strength  AROM;Strength;PROM  AROM   AROM Assessment Site  Shoulder    Right/Left Shoulder  Right    Right Shoulder Flexion  50 Degrees    Right Shoulder ABduction  45 Degrees    Right Shoulder Internal Rotation  40 Degrees    Right Shoulder External Rotation  25 Degrees      PROM   PROM Assessment Site  Shoulder    Right/Left Shoulder  Right    Right Shoulder Flexion  55 Degrees    Right Shoulder ABduction  50 Degrees    Right Shoulder Internal Rotation  40 Degrees    Right Shoulder External Rotation  25 Degrees      Strength   Overall Strength Comments  poor strength as she cannot get in position due to pain      Palpation   Palpation comment  very tight right upper trap, tender and gaurded to palpation                Objective measurements completed on examination: See above findings.      [facility] Adult PT Treatment/Exercise - 01/06/18 0001      Modalities   Modalities  Electrical Stimulation;Moist Heat      Moist Heat Therapy   Number Minutes Moist Heat  15 Minutes    Moist Heat Location  Shoulder      Electrical Stimulation   Electrical Stimulation Location  right shoulder    Electrical Stimulation Action  IFC     Electrical Stimulation Parameters  sitting    Electrical Stimulation Goals  Pain             PT Education - 01/06/18 1602    Education Details  AAROM/PROM of shoulder    Person(s) Educated  Patient    Methods  Explanation;Demonstration;Handout;Verbal cues;Tactile cues    Comprehension  Verbalized understanding       PT Short Term Goals - 01/06/18 1606      PT SHORT TERM GOAL #1   Title  independent with initial HEP    Time  2    Period  Weeks    Status  New        PT Long Term Goals - 01/06/18 1607      PT LONG TERM GOAL #1   Title  decrease pain 25%    Time  8    Period  Weeks    Status  New      PT LONG TERM GOAL #2   Title  increase right shoulder AROM for flexion to 120 degrees    Time  8    Period  Weeks    Status  New      PT LONG TERM GOAL #3   Title  increase AROM of right shoulder ER to 50 degrees    Time  8    Period  Weeks    Status  New      PT LONG TERM GOAL #4   Title  report that she can dress without difficulty    Time  8    Period  Weeks    Status  New             Plan - 01/06/18 1604    Clinical Impression Statement  Patient reports that she has been having diffiuclty with right shoulder pain and ROM for 8 months.  She has a high rating of pain , her AROM is very poor, 50 degrees flexion, PROM was only about 5 degrees better for  all motions.  She is tender and tight in the upper trap, she is right handed and has to lfit 30-40# at work.  She is having difficulty with dressing and doing hair    Clinical Presentation  Stable    Clinical Decision Making  Low    Rehab Potential  Good    PT Frequency  2x / week    PT Duration  8 weeks    PT Treatment/Interventions  ADLs/Self Care Home Management;Cryotherapy;Electrical Stimulation;Moist Heat;Iontophoresis 4mg /ml Dexamethasone;Ultrasound;Therapeutic activities;Therapeutic exercise;Manual techniques;Patient/family education    PT Next Visit Plan  slowly add exercises see if estim helped     Consulted and Agree with Plan of Care  Patient       Patient will benefit from skilled therapeutic intervention in order to improve the following deficits and impairments:  Decreased range of motion, Increased muscle spasms, Impaired UE functional use, Pain, Improper body mechanics, Impaired flexibility, Decreased strength, Postural dysfunction  Visit Diagnosis: Acute pain of right shoulder - Plan: PT plan of care cert/re-cert  Stiffness of right shoulder, not elsewhere classified - Plan: PT plan of care cert/re-cert  Other muscle spasm - Plan: PT plan of care cert/re-cert     Problem List Patient Active Problem List   Diagnosis Date Noted  . Cigarette nicotine dependence without complication 11/19/2017  . Adhesive capsulitis of right shoulder 11/19/2017  . Essential hypertension 11/19/2017  . Dyslipidemia 09/19/2015  . Cough 04/25/2015  . Muscle cramp 10/17/2014  . Type 2 diabetes mellitus without complication, with long-term current use of insulin (HCC) 11/24/2013    Jearld Lesch., PT 01/06/2018, 4:09 PM  [facility] [address] [place], [place], [address] Phone: [phone]   Fax:  [phone]  Name: [name] Hurley MRN: [id number] Date of Birth: [birth year]

## 2018-02-10 ENCOUNTER — Ambulatory Visit: Payer: BC Managed Care – PPO | Attending: Family Medicine | Admitting: Physical Therapy

## 2018-02-10 ENCOUNTER — Encounter: Payer: Self-pay | Admitting: Physical Therapy

## 2018-02-10 DIAGNOSIS — M25511 Pain in right shoulder: Secondary | ICD-10-CM | POA: Diagnosis not present

## 2018-02-10 DIAGNOSIS — M62838 Other muscle spasm: Secondary | ICD-10-CM

## 2018-02-10 DIAGNOSIS — M25611 Stiffness of right shoulder, not elsewhere classified: Secondary | ICD-10-CM

## 2018-02-10 NOTE — Therapy (Addendum)
[facility] [place], [place], [address] Phone: [phone]   Fax:  [phone]  Physical Therapy Treatment  Patient Details  Name: [name] Hurley MRN: [id number] Date of Birth: 1970-09-26 Referring Provider: Rosanne Gutting   Encounter Date: 02/10/2018  PT End of Session - 02/10/18 1606    Visit Number  2    Date for PT Re-Evaluation  03/08/18    PT Start Time  1530    PT Stop Time  1617    PT Time Calculation (min)  47 min    Activity Tolerance  Patient tolerated treatment well    Behavior During Therapy  [facility] for tasks assessed/performed       Past Medical History:  Diagnosis Date  . Diabetes mellitus   . Dyspareunia   . Hypertension   . Sciatica     Past Surgical History:  Procedure Laterality Date  . CESAREAN SECTION    . paragard     removal 7-16 & reinsertion 02-13-15    There were no vitals filed for this visit.  Subjective Assessment - 02/10/18 1528    Subjective  Patient reports that she has been on vacation the past month, she reports that she feels her ROM is slightly better but still in a lot of pain    Currently in Pain?  Yes    Pain Score  8     Pain Location  Shoulder    Pain Orientation  Right    Aggravating Factors   any motions                       [facility] Adult PT Treatment/Exercise - 02/10/18 0001      Exercises   Exercises  Shoulder      Shoulder Exercises: Seated   Extension  Both;20 reps;Theraband    Theraband Level (Shoulder Extension)  Level 2 (Red)    Row  Both;20 reps;Theraband    Theraband Level (Shoulder Row)  Level 2 (Red)    External Rotation  Both;20 reps;Theraband    Theraband Level (Shoulder External Rotation)  Level 2 (Red)    Internal Rotation  Right;Theraband    Theraband Level (Shoulder Internal Rotation)  Level 1 (Yellow)      Shoulder Exercises: ROM/Strengthening   UBE (Upper Arm Bike)  level 2 x 4 minutes      Modalities   Modalities  Electrical Stimulation;Moist Heat      Moist Heat Therapy   Number Minutes Moist Heat  15 Minutes    Moist Heat Location  Shoulder      Electrical Stimulation   Electrical Stimulation Location  right shoulder    Electrical Stimulation Action  IFC    Electrical Stimulation Parameters  sitting    Electrical Stimulation Goals  Pain      Manual Therapy   Manual Therapy  Passive ROM;Manual Traction    Passive ROM  gentle PROM all right shoulder GH motions, very painful    Manual Traction  tried some gentle caudal distraction to releive pain, she was very gaurded             PT Education - 02/10/18 1605    Education Details  reveiewd the last HEP she is doing and feels okay with it.  Added yellow tband scapular stabilization and ER    Person(s) Educated  Patient    Methods  Explanation;Demonstration;Tactile cues;Verbal cues;Handout    Comprehension  Verbalized understanding;Returned demonstration;Verbal  cues required;Tactile cues required       PT Short Term Goals - 02/10/18 1608      PT SHORT TERM GOAL #1   Title  independent with initial HEP    Status  Achieved        PT Long Term Goals - 01/06/18 1607      PT LONG TERM GOAL #1   Title  decrease pain 25%    Time  8    Period  Weeks    Status  New      PT LONG TERM GOAL #2   Title  increase right shoulder AROM for flexion to 120 degrees    Time  8    Period  Weeks    Status  New      PT LONG TERM GOAL #3   Title  increase AROM of right shoulder ER to 50 degrees    Time  8    Period  Weeks    Status  New      PT LONG TERM GOAL #4   Title  report that she can dress without difficulty    Time  8    Period  Weeks    Status  New            Plan - 02/10/18 1606    Clinical Impression Statement  Patient was on vacation the past month, she returns today with more c/o pain and higher rating of pain, she is gaurded to palpation in the upper trap and shoulder on the right.  She is very gaurded  and resistant even to very gentle PROM.  The ROM is about the same as eval which is extremely limited, but she reports that she is able to do more.    PT Next Visit Plan  slowly work on ROM    Consulted and Agree with Plan of Care  Patient       Patient will benefit from skilled therapeutic intervention in order to improve the following deficits and impairments:  Decreased range of motion, Increased muscle spasms, Impaired UE functional use, Pain, Improper body mechanics, Impaired flexibility, Decreased strength, Postural dysfunction  Visit Diagnosis: Acute pain of right shoulder  Stiffness of right shoulder, not elsewhere classified  Other muscle spasm     Problem List Patient Active Problem List   Diagnosis Date Noted  . Cigarette nicotine dependence without complication 82/50/5397  . Adhesive capsulitis of right shoulder 11/19/2017  . Essential hypertension 11/19/2017  . Dyslipidemia 09/19/2015  . Cough 04/25/2015  . Muscle cramp 10/17/2014  . Type 2 diabetes mellitus without complication, with long-term current use of insulin ([place]) 11/24/2013  PHYSICAL THERAPY DISCHARGE SUMMARY  Visits from Start of Care: 2   Plan: Patient agrees to discharge.  Patient goals were partially met. Patient is being discharged due to not returning since the last visit.  ?????       [name], PT 02/10/2018, 4:09 PM  [facility] [place], [place], [address] Phone: [phone]   Fax:  [phone]  Name: [name] MRN: [id number] Date of Birth: 06/24/1971

## 2018-02-11 ENCOUNTER — Encounter: Payer: BC Managed Care – PPO | Admitting: Physical Therapy

## 2018-02-12 ENCOUNTER — Ambulatory Visit: Payer: BC Managed Care – PPO | Admitting: Certified Nurse Midwife

## 2018-02-12 ENCOUNTER — Encounter: Payer: Self-pay | Admitting: Certified Nurse Midwife

## 2018-02-12 ENCOUNTER — Other Ambulatory Visit: Payer: Self-pay

## 2018-02-12 VITALS — BP 120/72 | HR 70 | Resp 16 | Ht 64.5 in | Wt 179.0 lb

## 2018-02-12 DIAGNOSIS — N898 Other specified noninflammatory disorders of vagina: Secondary | ICD-10-CM

## 2018-02-12 DIAGNOSIS — Z8639 Personal history of other endocrine, nutritional and metabolic disease: Secondary | ICD-10-CM | POA: Diagnosis not present

## 2018-02-12 NOTE — Progress Notes (Signed)
47 y.o. Married [place] female G1P1001 here with complaint of vaginal symptoms of itching, burning. Using Nystatin cream tid with relief until wears off. No ncrease discharge. Describes discharge as white, no odor.  Has been continuing to have itching even with medication external medication  use. Using black soap with Dove soap with no issues. No pelvic pain or pain with sexual activity.  Urinary symptoms none. Patient is diabetic and blood sugars still above 120, but symptoms had occurred. Contraception Paragard working well. No other health issues today.   Review of Systems  Constitutional: Negative.  Negative for fever.  Gastrointestinal: Negative.        Slight burning when urine touches skin   Genitourinary: Negative for dysuria, frequency and urgency.  Skin: Positive for itching.       Bug bites on legs    O:Healthy female WDWN Affect: normal, orientation x 3  Exam:Skin: warm and dry Abdomen: soft, non tender   inguinal Lymph nodes: no enlargement or tenderness Pelvic exam: External genital: normal female with edema noted bilateral with white exudate externally, no excoriations or cracking noted, non tender to touch BUS: negative Vagina: copious white very slight odorous discharge noted. , Affirm taken Cervix: normal, non tender, no CMT, IUD string noted in cervical os Uterus: normal, non tender Adnexa:normal, non tender, no masses or fullness noted   A:Normal pelvic exam R/O vaginal infection, suspect yeast due to diabetic history   P:Discussed findings of normal pelvic exam and given mirror to view vulva finding and possible etiology of yeast, but has been using Nystatin cream with no change.. Discussed Aveeno sitz bath for comfort bid  until lab back and will treat per finding.. Avoid moist clothes or pads for extended period of time. If working out in gym clothes or swim suits for long periods of time change underwear or bottoms of swimsuit if possible. Stop Nystatin  use and air dry after sitz bath. Questions addressed. Will follow up at [facility] next week.  Given information on starting probiotic to help with vaginal ph balance.  Rv prn, aex

## 2018-02-12 NOTE — Patient Instructions (Signed)

## 2018-02-13 ENCOUNTER — Other Ambulatory Visit: Payer: Self-pay | Admitting: Certified Nurse Midwife

## 2018-02-13 DIAGNOSIS — B373 Candidiasis of vulva and vagina: Secondary | ICD-10-CM

## 2018-02-13 DIAGNOSIS — B3731 Acute candidiasis of vulva and vagina: Secondary | ICD-10-CM

## 2018-02-13 LAB — VAGINITIS/VAGINOSIS, DNA PROBE
CANDIDA SPECIES: POSITIVE — AB
GARDNERELLA VAGINALIS: NEGATIVE
Trichomonas vaginosis: NEGATIVE

## 2018-02-13 MED ORDER — FLUCONAZOLE 150 MG PO TABS: ORAL_TABLET | ORAL | 1 refills | Status: DC

## 2018-02-16 ENCOUNTER — Encounter: Payer: Self-pay | Admitting: Certified Nurse Midwife

## 2018-02-17 ENCOUNTER — Ambulatory Visit: Payer: BC Managed Care – PPO | Admitting: Certified Nurse Midwife

## 2018-02-24 ENCOUNTER — Ambulatory Visit: Payer: BC Managed Care – PPO | Admitting: Physical Therapy

## 2018-03-03 ENCOUNTER — Encounter: Payer: Self-pay | Admitting: Certified Nurse Midwife

## 2018-03-12 ENCOUNTER — Telehealth: Payer: Self-pay | Admitting: Certified Nurse Midwife

## 2018-03-12 NOTE — Telephone Encounter (Signed)
Patient sent the following message through MyChart. Routing to triage to assist patient with request.  Good morning that itch has returned I have tried otc medicine but no relief, I am still doing the site bath 3x a week and taking the probiotic supplement, my BS have been slightly elevated 130 this am. I have an appointment with you on 9/6 for my annual. Is there anything I can do for the itching until I can see you.

## 2018-03-12 NOTE — Telephone Encounter (Signed)
Message left to return call to Makalyn Lennox at [phone].    

## 2018-03-12 NOTE — Telephone Encounter (Signed)
Spoke with patient. C/o continued vaginal itching. Declines office visit offered for evaluation since prescription and OTC products have not stopped symptoms.   Discussed that she should have one refill of Diflucan 150 mg PO available. Pt will take another dose of Diflucan and will try OTC hydrocortisone ointment and will see if symptoms improve.  If not improved will call back for office visit prior to annual exam.  Pt agreeable with plan.  Encounter to Dr. Edward JollySilva and will close.

## 2018-03-15 ENCOUNTER — Ambulatory Visit: Payer: BC Managed Care – PPO | Admitting: Family Medicine

## 2018-03-15 ENCOUNTER — Encounter: Payer: Self-pay | Admitting: Family Medicine

## 2018-03-15 VITALS — BP 122/82 | HR 99 | Temp 98.3°F | Ht 64.5 in | Wt 183.2 lb

## 2018-03-15 DIAGNOSIS — I10 Essential (primary) hypertension: Secondary | ICD-10-CM | POA: Diagnosis not present

## 2018-03-15 DIAGNOSIS — E785 Hyperlipidemia, unspecified: Secondary | ICD-10-CM

## 2018-03-15 DIAGNOSIS — F17210 Nicotine dependence, cigarettes, uncomplicated: Secondary | ICD-10-CM

## 2018-03-15 DIAGNOSIS — E119 Type 2 diabetes mellitus without complications: Secondary | ICD-10-CM | POA: Diagnosis not present

## 2018-03-15 DIAGNOSIS — Z794 Long term (current) use of insulin: Secondary | ICD-10-CM

## 2018-03-15 NOTE — Assessment & Plan Note (Signed)
Tolerating pravastatin well Recheck direct LDL.

## 2018-03-15 NOTE — Progress Notes (Signed)
[name] Hurley - 47 y.o. female MRN [id number]  Date of birth: 06-22-1971  Subjective Chief Complaint  Patient presents with  . Follow-up    F/U for DM , no changes.    HPI [name] Hurley is a 47 y.o. female with history of HTN, HLD and T2DM here today for follow up of these conditions.    -HTN:  Compliant with current medications.  She denies side effects from medication including symptoms of hypotension.  She tries to follow a low salt diet.  She is doing PT for shoulder pain but is not doing anything else for regular exercise.  Unfortunately she continues to smoke with no desire to quit.  She has not had anginal symptoms, headache, or vision changes.   -T2DM:  She brings her meter in with readings ranging from 115-178.  She is compliant with current medications.  She denies side effects from medication.  She has not had any episodes of hypoglycemia.    -HLD:  Elevated LDL previously, however now back on pravastatin.  Tolerating well and denies myalgias.   ROS:  A comprehensive ROS was completed and negative except as noted per HPI  No Known Allergies  Past Medical History:  Diagnosis Date  . Diabetes mellitus   . Dyspareunia   . Hypertension   . Sciatica     Past Surgical History:  Procedure Laterality Date  . CESAREAN SECTION    . paragard     removal 7-16 & reinsertion 02-13-15    Social History   Socioeconomic History  . Marital status: Married    Spouse name: Not on file  . Number of children: 4  . Years of education: 5914  . Highest education level: Not on file  Occupational History  . Occupation: Nutritition   Social Needs  . Financial resource strain: Not on file  . Food insecurity:    Worry: Not on file    Inability: Not on file  . Transportation needs:    Medical: Not on file    Non-medical: Not on file  Tobacco Use  . Smoking status: Current Every Day Smoker    Packs/day: 1.00    Years: 22.00    Pack years: 22.00    Types: Cigarettes  . Smokeless  tobacco: Never Used  Substance and Sexual Activity  . Alcohol use: Yes    Comment: only during summer time  . Drug use: No  . Sexual activity: Yes    Partners: Male    Birth control/protection: IUD  Lifestyle  . Physical activity:    Days per week: Not on file    Minutes per session: Not on file  . Stress: Not on file  Relationships  . Social connections:    Talks on phone: Not on file    Gets together: Not on file    Attends religious service: Not on file    Active member of club or organization: Not on file    Attends meetings of clubs or organizations: Not on file    Relationship status: Not on file  Other Topics Concern  . Not on file  Social History Narrative   Born [place] in [place] Fun: Sleep   Denies any religious beliefs effecting health care.     Family History  Problem Relation Age of Onset  . Hypertension Father   . COPD Father   . Emphysema Father   . Diabetes Maternal Aunt   . Hypertension Mother   . Hyperlipidemia Mother   .  Colon cancer Mother   . COPD Paternal Grandfather   . Hypertension Brother   . Heart disease Neg Hx     Health Maintenance  Topic Date Due  . HIV Screening  01/05/1984  . TETANUS/TDAP  03/16/2019 (Originally 01/05/1988)  . HEMOGLOBIN A1C  05/21/2018  . OPHTHALMOLOGY EXAM  09/10/2018  . FOOT EXAM  11/20/2018  . PAP SMEAR  02/11/2020  . INFLUENZA VACCINE  Completed  . PNEUMOCOCCAL POLYSACCHARIDE VACCINE AGE 12-64 HIGH RISK  Completed    ----------------------------------------------------------------------------------------------------------------------------------------------------------------------------------------------------------------- Physical Exam BP 122/82 (BP Location: Left Arm, Patient Position: Sitting, Cuff Size: Normal)   Pulse 99   Temp 98.3 F (36.8 C) (Oral)   Ht 5' 4.5" (1.638 m)   Wt 183 lb 3.2 oz (83.1 kg)   SpO2 96%   BMI 30.96 kg/m   Physical Exam  Constitutional: She is oriented to person,  place, and time. She appears well-nourished. No distress.  HENT:  Head: Normocephalic and atraumatic.  Mouth/Throat: Oropharynx is clear and moist.  Eyes: No scleral icterus.  Neck: Neck supple. No thyromegaly present.  Cardiovascular: Normal rate, regular rhythm and normal heart sounds.  Pulmonary/Chest: Effort normal and breath sounds normal.  Musculoskeletal: She exhibits no edema.  Lymphadenopathy:    She has no cervical adenopathy.  Neurological: She is alert and oriented to person, place, and time. No cranial nerve deficit.  Skin: Skin is warm and dry. No rash noted.  Psychiatric: She has a normal mood and affect. Her behavior is normal.    ------------------------------------------------------------------------------------------------------------------------------------------------------------------------------------------------------------------- Assessment and Plan  Type 2 diabetes mellitus without complication, with long-term current use of insulin (HCC) Blood sugars are better controlled based on home readings Update A1c Continue current medications Work on incorporation of routine exercise F/u 3 months.   Essential hypertension BP is well controlled Continue current medication Follow low salt diet   Dyslipidemia Tolerating pravastatin well Recheck direct LDL.   Cigarette nicotine dependence without complication 5 min spent counseling regarding smoking cessation Advised to quit, however not interested in quitting at this time.  Offered medication however declines at this time.  Will continue to encourage and re-address at f/u in 3 months.

## 2018-03-15 NOTE — Patient Instructions (Signed)
Continue current medications Continue to monitor blood sugars at home Work on cutting back on smoking with goal of quitting.   I will see you back in about 3 months.  You can schedule a nurse visit for a flu shot in a couple of weeks.

## 2018-03-15 NOTE — Assessment & Plan Note (Signed)
5 min spent counseling regarding smoking cessation Advised to quit, however not interested in quitting at this time.  Offered medication however declines at this time.  Will continue to encourage and re-address at f/u in 3 months.

## 2018-03-15 NOTE — Assessment & Plan Note (Signed)
BP is well controlled Continue current medication Follow low salt diet 

## 2018-03-15 NOTE — Assessment & Plan Note (Signed)
Blood sugars are better controlled based on home readings Update A1c Continue current medications Work on incorporation of routine exercise F/u 3 months.

## 2018-03-16 ENCOUNTER — Other Ambulatory Visit: Payer: BC Managed Care – PPO

## 2018-04-02 ENCOUNTER — Encounter: Payer: Self-pay | Admitting: Certified Nurse Midwife

## 2018-04-02 ENCOUNTER — Ambulatory Visit: Payer: BC Managed Care – PPO | Admitting: Certified Nurse Midwife

## 2018-04-02 ENCOUNTER — Other Ambulatory Visit: Payer: Self-pay

## 2018-04-02 VITALS — BP 116/80 | HR 70 | Resp 16 | Ht 64.5 in | Wt 177.0 lb

## 2018-04-02 DIAGNOSIS — Z1211 Encounter for screening for malignant neoplasm of colon: Secondary | ICD-10-CM | POA: Diagnosis not present

## 2018-04-02 DIAGNOSIS — Z01419 Encounter for gynecological examination (general) (routine) without abnormal findings: Secondary | ICD-10-CM | POA: Diagnosis not present

## 2018-04-02 DIAGNOSIS — Z30431 Encounter for routine checking of intrauterine contraceptive device: Secondary | ICD-10-CM

## 2018-04-02 NOTE — Patient Instructions (Signed)

## 2018-04-02 NOTE — Progress Notes (Signed)
47 y.o. G1P1001 Married  African American Fe here for annual exam. Periods normal with Mirena IUD monthly, no issues. Continues to have vaginal itching at least once monthly due to Diabetes. Would like to try not to use Diflucan. Sees PCP for labs, diabetes,  Hypertension and cholesterol management, all stable at present. No other health issues today.  Patient's last menstrual period was 03/29/2018 (exact date).          Sexually active: Yes.    The current method of family planning is IUD.    Exercising: No.  exercise Smoker:  yes  Review of Systems  Constitutional: Negative.   HENT: Negative.   Eyes: Negative.   Respiratory: Negative.   Cardiovascular: Negative.   Gastrointestinal: Negative.   Genitourinary: Negative.   Musculoskeletal: Negative.   Skin: Negative.   Neurological: Negative.   Endo/Heme/Allergies: Negative.   Psychiatric/Behavioral: Negative.     Health Maintenance: Pap:  12-20-14 neg HPV HR neg, 02-10-17 neg History of Abnormal Pap: no MMG:  02-22-15 category a density birads 1:neg Self Breast exams: yes Colonoscopy:  2005 neg per patient BMD:   none TDaP:  Pt declines update Shingles: no Pneumonia: 2019 Hep C and HIV: HIV neg yrs ago, Hep c neg 2017 Labs: with PCP   reports that she has been smoking cigarettes. She has a 22.00 pack-year smoking history. She has never used smokeless tobacco. She reports that she drank alcohol. She reports that she does not use drugs.  Past Medical History:  Diagnosis Date  . Diabetes mellitus   . Dyspareunia   . Hypertension   . Sciatica     Past Surgical History:  Procedure Laterality Date  . CESAREAN SECTION    . paragard     removal 7-16 & reinsertion 02-13-15    Current Outpatient Medications  Medication Sig Dispense Refill  . Fluticasone Furoate-Vilanterol (BREO ELLIPTA) 100-25 MCG/INH AEPB Inhale 1 puff into the lungs daily as needed. 28 each 0  . glucose blood (ONE TOUCH ULTRA TEST) test strip Use as  instructed to check blood sugar 2 times per day dx code 250.02 100 each 2  . insulin degludec (TRESIBA FLEXTOUCH) 100 UNIT/ML SOPN FlexTouch Pen Inject 0.2 mLs (20 Units total) into the skin daily. 5 pen 1  . JARDIANCE 10 MG TABS tablet Take 10 mg by mouth daily. 30 tablet 6  . liraglutide (VICTOZA) 18 MG/3ML SOPN INJECT 1.2MG  SUBCUTANEOUSLY DAILY AT THE SAME TIME EACH DAY 18 mL 1  . losartan (COZAAR) 25 MG tablet Take 1 tablet (25 mg total) by mouth daily. 90 tablet 2  . metFORMIN (GLUCOPHAGE) 1000 MG tablet Take 1 tablet (1,000 mg total) by mouth 2 (two) times daily with a meal. 180 tablet 3  . pravastatin (PRAVACHOL) 10 MG tablet Take 1 tablet (10 mg total) by mouth daily. 90 tablet 2   No current facility-administered medications for this visit.     Family History  Problem Relation Age of Onset  . Hypertension Father   . COPD Father   . Emphysema Father   . Diabetes Maternal Aunt   . Hypertension Mother   . Hyperlipidemia Mother   . Colon cancer Mother   . COPD Paternal Grandfather   . Hypertension Brother   . Heart disease Neg Hx     ROS:  Pertinent items are noted in HPI.  Otherwise, a comprehensive ROS was negative.  Exam:   BP 116/80   Pulse 70   Resp 16   Ht  5' 4.5" (1.638 m)   Wt 177 lb (80.3 kg)   LMP 03/29/2018 (Exact Date)   BMI 29.91 kg/m  Height: 5' 4.5" (163.8 cm) Ht Readings from Last 3 Encounters:  04/02/18 5' 4.5" (1.638 m)  03/15/18 5' 4.5" (1.638 m)  02/12/18 5' 4.5" (1.638 m)    General appearance: alert, cooperative and appears stated age Head: Normocephalic, without obvious abnormality, atraumatic Neck: no adenopathy, supple, symmetrical, trachea midline and thyroid normal to inspection and palpation Lungs: clear to auscultation bilaterally Breasts: normal appearance, no masses or tenderness, No nipple retraction or dimpling, No nipple discharge or bleeding, No axillary or supraclavicular adenopathy Heart: regular rate and rhythm Abdomen: soft,  non-tender; no masses,  no organomegaly Extremities: extremities normal, atraumatic, no cyanosis or edema Skin: Skin color, texture, turgor normal. No rashes or lesions Lymph nodes: Cervical, supraclavicular, and axillary nodes normal. No abnormal inguinal nodes palpated Neurologic: Grossly normal   Pelvic: External genitalia:  no lesions              Urethra:  normal appearing urethra with no masses, tenderness or lesions              Bartholin's and Skene's: normal                 Vagina: normal appearing vagina with normal color and discharge, no lesions              Cervix: no cervical motion tenderness, no lesions and IUD string noted in cervix              Pap taken: No. Bimanual Exam:  Uterus:  normal size, contour, position, consistency, mobility, non-tender and anteverted              Adnexa: normal adnexa and no mass, fullness, tenderness               Rectovaginal: Confirms               Anus:  normal sphincter tone, no lesions  Chaperone present: yes  A:  Well Woman with normal exam  Contraception Mirena IUD  Chronic vaginal yeast with Diflucan use  Colonoscopy due  P:   Reviewed health and wellness pertinent to exam  Aware of warning signs and need to advise if occurring.  Discussed using Clotrimazole OTC to try and advise if no change.  Continue follow up with PCP as indicated  Referral placed to Dr. Loreta Ave to schedule  Pap smear: no   counseled on breast self exam, mammography screening, feminine hygiene, adequate intake of calcium and vitamin D, diet and exercise  return annually or prn  An After Visit Summary was printed and given to the patient.

## 2018-04-28 ENCOUNTER — Telehealth: Payer: Self-pay | Admitting: Endocrinology

## 2018-04-28 NOTE — Telephone Encounter (Signed)
-----   Message from Reather Littler, MD sent at 04/27/2018  2:32 PM EDT ----- Regarding: Follow-up She is overdue for her diabetes follow-up, needs labs also.  If she is not coming back for follow-up will be considered self dismissed

## 2018-09-08 ENCOUNTER — Ambulatory Visit: Payer: BC Managed Care – PPO | Admitting: Family Medicine

## 2018-09-08 ENCOUNTER — Encounter: Payer: Self-pay | Admitting: Family Medicine

## 2018-09-08 VITALS — BP 142/78 | HR 95 | Temp 98.1°F | Ht 65.5 in | Wt 181.0 lb

## 2018-09-08 DIAGNOSIS — M25511 Pain in right shoulder: Secondary | ICD-10-CM

## 2018-09-08 DIAGNOSIS — F17210 Nicotine dependence, cigarettes, uncomplicated: Secondary | ICD-10-CM

## 2018-09-08 DIAGNOSIS — E785 Hyperlipidemia, unspecified: Secondary | ICD-10-CM

## 2018-09-08 DIAGNOSIS — Z794 Long term (current) use of insulin: Secondary | ICD-10-CM | POA: Diagnosis not present

## 2018-09-08 DIAGNOSIS — I10 Essential (primary) hypertension: Secondary | ICD-10-CM

## 2018-09-08 DIAGNOSIS — E119 Type 2 diabetes mellitus without complications: Secondary | ICD-10-CM | POA: Diagnosis not present

## 2018-09-08 DIAGNOSIS — M7501 Adhesive capsulitis of right shoulder: Secondary | ICD-10-CM

## 2018-09-08 MED ORDER — EMPAGLIFLOZIN 10 MG PO TABS: 10.0000 mg | ORAL_TABLET | Freq: Every day | ORAL | 1 refills | Status: DC

## 2018-09-08 MED ORDER — PRAVASTATIN SODIUM 10 MG PO TABS: 10.0000 mg | ORAL_TABLET | Freq: Every day | ORAL | 2 refills | Status: DC

## 2018-09-08 MED ORDER — LIRAGLUTIDE 18 MG/3ML ~~LOC~~ SOPN: PEN_INJECTOR | SUBCUTANEOUS | 1 refills | Status: DC

## 2018-09-08 MED ORDER — METFORMIN HCL 1000 MG PO TABS: 1000.0000 mg | ORAL_TABLET | Freq: Two times a day (BID) | ORAL | 3 refills | Status: DC

## 2018-09-08 MED ORDER — LOSARTAN POTASSIUM 25 MG PO TABS: 25.0000 mg | ORAL_TABLET | Freq: Every day | ORAL | 2 refills | Status: DC

## 2018-09-08 MED ORDER — INSULIN DEGLUDEC 100 UNIT/ML ~~LOC~~ SOPN: 20.0000 [IU] | PEN_INJECTOR | Freq: Every day | SUBCUTANEOUS | 1 refills | Status: DC

## 2018-09-08 NOTE — Assessment & Plan Note (Signed)
-  Suspect glucose is not well controlled at this time.  -Update a1c -Work on dietary changes and restart tresiba. -F/u in 3 months.

## 2018-09-08 NOTE — Assessment & Plan Note (Signed)
Update D-ldl

## 2018-09-08 NOTE — Progress Notes (Signed)
[name] Hurley - 48 y.o. female MRN [id number]  Date of birth: 15-Jul-1971  Subjective Chief Complaint  Patient presents with  . Follow-up    denies new health changes    HPI [name] Hurley is a 48 y.o. female with history of T2DM, HTN and nicotine dependence here today for follow up.   -HTN:  Current tx with losartan, tolerating well without side effects.  She denies chest pain, shortness of breath, palpitations, or headache.   -T2DM:  Has been off tresiba for about 1.5 months, had problems getting refill.  Not monitoring glucose regularly at this time and has had some increased urination and fatigue.  She has been taking metformin, jardiance and victoza.  She denies side effects from these medications.   -Nicotine dependence:  Continues to smoke ~1ppd.  She enjoys smoking but realizes she needs to quit.  She has quit for several months with chantix in the past and may be interested in trying this again in the future.    -Right shoulder pain:  Continues to have pain of the R shoulder.  Did PT in summer of 2019 without much improvement.    ROS:  A comprehensive ROS was completed and negative except as noted per HPI  No Known Allergies  Past Medical History:  Diagnosis Date  . Diabetes mellitus   . Dyspareunia   . Hypertension   . Sciatica     Past Surgical History:  Procedure Laterality Date  . CESAREAN SECTION    . paragard     removal 7-16 & reinsertion 02-13-15    Social History   Socioeconomic History  . Marital status: Married    Spouse name: Not on file  . Number of children: 4  . Years of education: 17  . Highest education level: Not on file  Occupational History  . Occupation: Nutritition   Social Needs  . Financial resource strain: Not on file  . Food insecurity:    Worry: Not on file    Inability: Not on file  . Transportation needs:    Medical: Not on file    Non-medical: Not on file  Tobacco Use  . Smoking status: Current Every Day Smoker   Packs/day: 1.00    Years: 22.00    Pack years: 22.00    Types: Cigarettes  . Smokeless tobacco: Never Used  Substance and Sexual Activity  . Alcohol use: Not Currently  . Drug use: No  . Sexual activity: Yes    Partners: Male    Birth control/protection: I.U.D.  Lifestyle  . Physical activity:    Days per week: Not on file    Minutes per session: Not on file  . Stress: Not on file  Relationships  . Social connections:    Talks on phone: Not on file    Gets together: Not on file    Attends religious service: Not on file    Active member of club or organization: Not on file    Attends meetings of clubs or organizations: Not on file    Relationship status: Not on file  Other Topics Concern  . Not on file  Social History Narrative   Born and raised in [place]. Fun: Sleep   Denies any religious beliefs effecting health care.     Family History  Problem Relation Age of Onset  . Hypertension Father   . COPD Father   . Emphysema Father   . Diabetes Maternal Aunt   . Hypertension Mother   . Hyperlipidemia  Mother   . Colon cancer Mother   . COPD Paternal Grandfather   . Hypertension Brother   . Heart disease Neg Hx     Health Maintenance  Topic Date Due  . HIV Screening  01/05/1984  . HEMOGLOBIN A1C  05/21/2018  . TETANUS/TDAP  03/16/2019 (Originally 01/05/1988)  . OPHTHALMOLOGY EXAM  09/11/2018  . FOOT EXAM  11/20/2018  . PAP SMEAR-Modifier  02/11/2020  . INFLUENZA VACCINE  Completed  . PNEUMOCOCCAL POLYSACCHARIDE VACCINE AGE 50-64 HIGH RISK  Completed    ----------------------------------------------------------------------------------------------------------------------------------------------------------------------------------------------------------------- Physical Exam BP (!) 142/78   Pulse 95   Temp 98.1 F (36.7 C) (Oral)   Ht 5' 5.5" (1.664 m)   Wt 181 lb (82.1 kg)   SpO2 95%   BMI 29.66 kg/m   Physical Exam Constitutional:      Appearance: Normal  appearance.  HENT:     Head: Normocephalic and atraumatic.     Mouth/Throat:     Mouth: Mucous membranes are moist.  Eyes:     General: No scleral icterus. Cardiovascular:     Rate and Rhythm: Normal rate and regular rhythm.  Pulmonary:     Effort: Pulmonary effort is normal.     Breath sounds: Normal breath sounds.  Skin:    General: Skin is warm and dry.  Neurological:     Mental Status: She is alert.  Psychiatric:        Mood and Affect: Mood normal.        Behavior: Behavior normal.     ------------------------------------------------------------------------------------------------------------------------------------------------------------------------------------------------------------------- Assessment and Plan  Essential hypertension Stable, continue losartan.  Update bmp  Type 2 diabetes mellitus without complication, with long-term current use of insulin (HCC) -Suspect glucose is not well controlled at this time.  -Update a1c -Work on dietary changes and restart tresiba. -F/u in 3 months.   Cigarette nicotine dependence without complication -Discussed importance of quitting smoking -May want to try chantix again in the future as she had success with this in the past.   Dyslipidemia Update D-ldl  Adhesive capsulitis of right shoulder Continued pain, referral to sports medicine.

## 2018-09-08 NOTE — Assessment & Plan Note (Signed)
Stable, continue losartan.  Update bmp

## 2018-09-08 NOTE — Patient Instructions (Signed)
Coping with Quitting Smoking  Quitting smoking is a physical and mental challenge. You will face cravings, withdrawal symptoms, and temptation. Before quitting, work with your health care provider to make a plan that can help you cope. Preparation can help you quit and keep you from giving in. How can I cope with cravings? Cravings usually last for 5-10 minutes. If you get through it, the craving will pass. Consider taking the following actions to help you cope with cravings:  Keep your mouth busy: ? Chew sugar-free gum. ? Suck on hard candies or a straw. ? Brush your teeth.  Keep your hands and body busy: ? Immediately change to a different activity when you feel a craving. ? Squeeze or play with a ball. ? Do an activity or a hobby, like making bead jewelry, practicing needlepoint, or working with wood. ? Mix up your normal routine. ? Take a short exercise break. Go for a quick walk or run up and down stairs. ? Spend time in public places where smoking is not allowed.  Focus on doing something kind or helpful for someone else.  Call a friend or family member to talk during a craving.  Join a support group.  Call a quit line, such as 1-800-QUIT-NOW.  Talk with your health care provider about medicines that might help you cope with cravings and make quitting easier for you. How can I deal with withdrawal symptoms? Your body may experience negative effects as it tries to get used to not having nicotine in the system. These effects are called withdrawal symptoms. They may include:  Feeling hungrier than normal.  Trouble concentrating.  Irritability.  Trouble sleeping.  Feeling depressed.  Restlessness and agitation.  Craving a cigarette. To manage withdrawal symptoms:  Avoid places, people, and activities that trigger your cravings.  Remember why you want to quit.  Get plenty of sleep.  Avoid coffee and other caffeinated drinks. These may worsen some of your symptoms.  How can I handle social situations? Social situations can be difficult when you are quitting smoking, especially in the first few weeks. To manage this, you can:  Avoid parties, bars, and other social situations where people might be smoking.  Avoid alcohol.  Leave right away if you have the urge to smoke.  Explain to your family and friends that you are quitting smoking. Ask for understanding and support.  Plan activities with friends or family where smoking is not an option. What are some ways I can cope with stress? Wanting to smoke may cause stress, and stress can make you want to smoke. Find ways to manage your stress. Relaxation techniques can help. For example:  Breathe slowly and deeply, in through your nose and out through your mouth.  Listen to soothing, relaxing music.  Talk with a family member or friend about your stress.  Light a candle.  Soak in a bath or take a shower.  Think about a peaceful place. What are some ways I can prevent weight gain? Be aware that many people gain weight after they quit smoking. However, not everyone does. To keep from gaining weight, have a plan in place before you quit and stick to the plan after you quit. Your plan should include:  Having healthy snacks. When you have a craving, it may help to: ? Eat plain popcorn, crunchy carrots, celery, or other cut vegetables. ? Chew sugar-free gum.  Changing how you eat: ? Eat small portion sizes at meals. ? Eat 4-6 small meals   throughout the day instead of 1-2 large meals a day. ? Be mindful when you eat. Do not watch television or do other things that might distract you as you eat.  Exercising regularly: ? Make time to exercise each day. If you do not have time for a long workout, do short bouts of exercise for 5-10 minutes several times a day. ? Do some form of strengthening exercise, like weight lifting, and some form of aerobic exercise, like running or swimming.  Drinking plenty of  water or other low-calorie or no-calorie drinks. Drink 6-8 glasses of water daily, or as much as instructed by your health care provider. Summary  Quitting smoking is a physical and mental challenge. You will face cravings, withdrawal symptoms, and temptation to smoke again. Preparation can help you as you go through these challenges.  You can cope with cravings by keeping your mouth busy (such as by chewing gum), keeping your body and hands busy, and making calls to family, friends, or a helpline for people who want to quit smoking.  You can cope with withdrawal symptoms by avoiding places where people smoke, avoiding drinks with caffeine, and getting plenty of rest.  Ask your health care provider about the different ways to prevent weight gain, avoid stress, and handle social situations. This information is not intended to replace advice given to you by your health care provider. Make sure you discuss any questions you have with your health care provider. Document Released: 07/11/2016 Document Revised: 07/11/2016 Document Reviewed: 07/11/2016 Elsevier Interactive Patient Education  2019 Elsevier Inc.  

## 2018-09-08 NOTE — Assessment & Plan Note (Signed)
Continued pain, referral to sports medicine.

## 2018-09-08 NOTE — Assessment & Plan Note (Signed)
-  Discussed importance of quitting smoking -May want to try chantix again in the future as she had success with this in the past.

## 2018-09-09 LAB — BASIC METABOLIC PANEL
BUN: 28 mg/dL — ABNORMAL HIGH (ref 6–23)
CO2: 23 mEq/L (ref 19–32)
CREATININE: 1.31 mg/dL — AB (ref 0.40–1.20)
Calcium: 9.5 mg/dL (ref 8.4–10.5)
Chloride: 104 mEq/L (ref 96–112)
GFR: 52.07 mL/min — ABNORMAL LOW (ref >60.00)
Glucose, Bld: 120 mg/dL — ABNORMAL HIGH (ref 70–99)
Potassium: 4.1 mEq/L (ref 3.5–5.1)
Sodium: 139 mEq/L (ref 135–145)

## 2018-09-15 NOTE — Progress Notes (Signed)
Kidney function as declined some. Try to increase hydration and lets recheck BMP in two weeks.

## 2018-09-24 ENCOUNTER — Encounter: Payer: Self-pay | Admitting: *Deleted

## 2018-09-29 ENCOUNTER — Other Ambulatory Visit: Payer: Self-pay | Admitting: Family Medicine

## 2018-09-29 ENCOUNTER — Ambulatory Visit (INDEPENDENT_AMBULATORY_CARE_PROVIDER_SITE_OTHER): Payer: BC Managed Care – PPO

## 2018-09-29 ENCOUNTER — Encounter: Payer: Self-pay | Admitting: Family Medicine

## 2018-09-29 ENCOUNTER — Ambulatory Visit: Payer: BC Managed Care – PPO | Admitting: Family Medicine

## 2018-09-29 VITALS — BP 132/80 | HR 90 | Temp 97.8°F | Ht 65.5 in | Wt 184.4 lb

## 2018-09-29 DIAGNOSIS — Z794 Long term (current) use of insulin: Secondary | ICD-10-CM | POA: Diagnosis not present

## 2018-09-29 DIAGNOSIS — E119 Type 2 diabetes mellitus without complications: Secondary | ICD-10-CM

## 2018-09-29 DIAGNOSIS — M7062 Trochanteric bursitis, left hip: Secondary | ICD-10-CM | POA: Diagnosis not present

## 2018-09-29 DIAGNOSIS — M25552 Pain in left hip: Secondary | ICD-10-CM

## 2018-09-29 DIAGNOSIS — M7072 Other bursitis of hip, left hip: Secondary | ICD-10-CM | POA: Insufficient documentation

## 2018-09-29 LAB — BASIC METABOLIC PANEL
BUN: 20 mg/dL (ref 6–23)
CALCIUM: 9.1 mg/dL (ref 8.4–10.5)
CO2: 25 meq/L (ref 19–32)
Chloride: 102 mEq/L (ref 96–112)
Creatinine, Ser: 1.14 mg/dL (ref 0.40–1.20)
GFR: 61.11 mL/min (ref >60.00)
Glucose, Bld: 153 mg/dL — ABNORMAL HIGH (ref 70–99)
Potassium: 4.6 mEq/L (ref 3.5–5.1)
Sodium: 135 mEq/L (ref 135–145)

## 2018-09-29 LAB — LDL CHOLESTEROL, DIRECT: Direct LDL: 111 mg/dL

## 2018-09-29 LAB — HEMOGLOBIN A1C: Hgb A1c MFr Bld: 8.9 % — ABNORMAL HIGH (ref 4.6–6.5)

## 2018-09-29 MED ORDER — KETOROLAC TROMETHAMINE 60 MG/2ML IM SOLN
60.0000 mg | Freq: Once | INTRAMUSCULAR | Status: AC
  Administered 2018-09-29: 60 mg via INTRAMUSCULAR

## 2018-09-29 NOTE — Patient Instructions (Signed)
Hip Bursitis  Hip bursitis is swelling of a fluid-filled sac (bursa) in your hip. This swelling (inflammation) can be painful. This condition may come and go over time. Follow these instructions at home: Medicines  Take over-the-counter and prescription medicines only as told by your doctor.  Do not drive or use heavy machinery while taking prescription pain medicine, or as told by your doctor.  If you were prescribed an antibiotic medicine, take it as told by your doctor. Do not stop taking the antibiotic even if you start to feel better. Activity  Return to your normal activities as told by your doctor. Ask your doctor what activities are safe for you.  Rest and protect your hip until you feel better. General instructions  Wear wraps that put pressure on your hip (compression wraps) only as told by your doctor.  Raise (elevate) your hip above the level of your heart as much as you can. To do this, try putting a pillow under your hips while you lie down. Stop if this causes pain.  Do not use your hip to support your body weight until your doctor says that you can.  Use crutches as told by your doctor.  Gently rub and stretch your injured area as often as is comfortable.  Keep all follow-up visits as told by your doctor. This is important. How is this prevented?  Exercise regularly, as told by your doctor.  Warm up and stretch before being active.  Cool down and stretch after being active.  Avoid activities that bother your hip or cause pain.  Avoid sitting down for long periods at a time. Contact a doctor if:  You have a fever.  You get new symptoms.  You have trouble walking.  You have trouble doing everyday activities.  You have pain that gets worse.  You have pain that does not get better with medicine.  You get red skin on your hip area.  You get a feeling of warmth in your hip area. Get help right away if:  You cannot move your hip.  You have very  bad pain. This information is not intended to replace advice given to you by your health care provider. Make sure you discuss any questions you have with your health care provider. Document Released: 08/16/2010 Document Revised: 12/20/2015 Document Reviewed: 02/13/2015 Elsevier Interactive Patient Education  2019 Elsevier Inc.  

## 2018-09-29 NOTE — Assessment & Plan Note (Addendum)
-  Discussed injection which she is hesitant about at this time.  -Will try toradol 60mg  initially, given in clinic today.  -Xrays requested -Recommend if not getting better to see sports medicine.

## 2018-09-29 NOTE — Assessment & Plan Note (Signed)
Needs updated a1c, did not have at last visit.

## 2018-09-29 NOTE — Progress Notes (Signed)
[name] Hurley - 48 y.o. female MRN [id number]  Date of birth: 01/22/1971  Subjective Chief Complaint  Patient presents with  . Hip Pain    left hip pain, on going for x 1 week, pt has been taking motrin but no relief, no known injury to hip, no falls or mva, have not been able to work     HPI [name] Hurley is a 48 y.o. female here today with complaint of L hip pain.  She reports that symptoms began about 5 days ago.  She denies known injury or overuse.  It is painful to walk, move, or pivot on the hip.  She has tried OTC nsaids without much improvement.  She denies urinary symptoms and has not had any changes to her bowels.  She denies fever, chills.   ROS:  A comprehensive ROS was completed and negative except as noted per HPI  No Known Allergies  Past Medical History:  Diagnosis Date  . Diabetes mellitus   . Dyspareunia   . Hypertension   . Sciatica     Past Surgical History:  Procedure Laterality Date  . CESAREAN SECTION    . paragard     removal 7-16 & reinsertion 02-13-15    Social History   Socioeconomic History  . Marital status: Married    Spouse name: Not on file  . Number of children: 4  . Years of education: 71  . Highest education level: Not on file  Occupational History  . Occupation: Nutritition   Social Needs  . Financial resource strain: Not on file  . Food insecurity:    Worry: Not on file    Inability: Not on file  . Transportation needs:    Medical: Not on file    Non-medical: Not on file  Tobacco Use  . Smoking status: Current Every Day Smoker    Packs/day: 1.00    Years: 22.00    Pack years: 22.00    Types: Cigarettes  . Smokeless tobacco: Never Used  Substance and Sexual Activity  . Alcohol use: Not Currently  . Drug use: No  . Sexual activity: Yes    Partners: Male    Birth control/protection: I.U.D.  Lifestyle  . Physical activity:    Days per week: Not on file    Minutes per session: Not on file  . Stress: Not on file    Relationships  . Social connections:    Talks on phone: Not on file    Gets together: Not on file    Attends religious service: Not on file    Active member of club or organization: Not on file    Attends meetings of clubs or organizations: Not on file    Relationship status: Not on file  Other Topics Concern  . Not on file  Social History Narrative   Born and raised in [place]. Fun: Sleep   Denies any religious beliefs effecting health care.     Family History  Problem Relation Age of Onset  . Hypertension Father   . COPD Father   . Emphysema Father   . Diabetes Maternal Aunt   . Hypertension Mother   . Hyperlipidemia Mother   . Colon cancer Mother   . COPD Paternal Grandfather   . Hypertension Brother   . Heart disease Neg Hx     Health Maintenance  Topic Date Due  . HIV Screening  01/05/1984  . HEMOGLOBIN A1C  05/21/2018  . OPHTHALMOLOGY EXAM  09/11/2018  . TETANUS/TDAP  03/16/2019 (Originally 01/05/1988)  . FOOT EXAM  11/20/2018  . PAP SMEAR-Modifier  02/11/2020  . INFLUENZA VACCINE  Completed  . PNEUMOCOCCAL POLYSACCHARIDE VACCINE AGE 34-64 HIGH RISK  Completed    ----------------------------------------------------------------------------------------------------------------------------------------------------------------------------------------------------------------- Physical Exam BP 132/80 (BP Location: Left Arm, Patient Position: Sitting, Cuff Size: Normal)   Pulse 90   Temp 97.8 F (36.6 C) (Oral)   Ht 5' 5.5" (1.664 m)   Wt 184 lb 6.4 oz (83.6 kg)   SpO2 97%   BMI 30.22 kg/m   Physical Exam Constitutional:      Appearance: Normal appearance.  HENT:     Head: Normocephalic and atraumatic.  Eyes:     General: No scleral icterus. Neck:     Musculoskeletal: Neck supple.  Cardiovascular:     Rate and Rhythm: Normal rate and regular rhythm.  Abdominal:     General: Abdomen is flat. There is no distension.     Palpations: Abdomen is soft.      Tenderness: There is no abdominal tenderness.  Musculoskeletal:     Comments: ROM of L hip is limited and painful with both IR/ER.  Pain with abduction.  Hip flexion ok.  TTP along lateral hip and greater trochanter.  Skin:    General: Skin is warm and dry.  Neurological:     General: No focal deficit present.     Mental Status: She is alert.  Psychiatric:        Mood and Affect: Mood normal.        Behavior: Behavior normal.     ------------------------------------------------------------------------------------------------------------------------------------------------------------------------------------------------------------------- Assessment and Plan  Type 2 diabetes mellitus without complication, with long-term current use of insulin (HCC) Needs updated a1c, did not have at last visit.   Bursitis of left hip -Discussed injection which she is hesitant about at this time.  -Will try toradol 60mg  initially, given in clinic today.  -Xrays requested -Recommend if not getting better to see sports medicine.

## 2018-10-01 ENCOUNTER — Encounter: Payer: Self-pay | Admitting: Family Medicine

## 2018-10-01 ENCOUNTER — Telehealth: Payer: Self-pay | Admitting: Family Medicine

## 2018-10-01 ENCOUNTER — Other Ambulatory Visit: Payer: Self-pay | Admitting: Family Medicine

## 2018-10-01 MED ORDER — DICLOFENAC SODIUM 75 MG PO TBEC: 75.0000 mg | DELAYED_RELEASE_TABLET | Freq: Two times a day (BID) | ORAL | 0 refills | Status: DC

## 2018-10-01 NOTE — Telephone Encounter (Signed)
Spoke with [name] Hurley and made her aware of Dr. Anastasio Auerbach message. [name] Hurley agreed to see Dr. Jordan Likes and per [name] Hurley she would like an afternoon appt. [name] Hurley had no any additional questions at this time. Nothing further is needed. Will speak with clerical staff in regards to scheduling.

## 2018-10-01 NOTE — Telephone Encounter (Signed)
Please let her know that xray looks ok as well, only some mild arthritis.

## 2018-10-01 NOTE — Telephone Encounter (Signed)
I discussed with her seeing Dr. Jordan Likes, please have her schedule with him for evaluation of the hip.  I am going to send in some medication to help with this in the mean time

## 2018-10-01 NOTE — Telephone Encounter (Signed)
Copied from CRM [id number]. Topic: Quick Communication - See Telephone Encounter >> Oct 01, 2018 11:03 AM Lorrine Kin, NT wrote: CRM for notification. See Telephone encounter for: 10/01/18. Patient calling to leave a message for Dr Ashley Royalty. States that she is still in pain and that the shot only minimized the pain. She tried to go to work today and could not make it through the whole day due to the pain. Would like to know what the next step needs to be? Please advise.  CB#: [phone]

## 2018-10-07 ENCOUNTER — Encounter: Payer: Self-pay | Admitting: Family Medicine

## 2018-10-07 ENCOUNTER — Other Ambulatory Visit: Payer: Self-pay

## 2018-10-07 ENCOUNTER — Ambulatory Visit: Payer: BC Managed Care – PPO | Admitting: Family Medicine

## 2018-10-07 ENCOUNTER — Ambulatory Visit (INDEPENDENT_AMBULATORY_CARE_PROVIDER_SITE_OTHER): Payer: BC Managed Care – PPO

## 2018-10-07 VITALS — BP 140/94 | HR 94 | Temp 98.7°F | Ht 65.5 in | Wt 180.4 lb

## 2018-10-07 DIAGNOSIS — M7062 Trochanteric bursitis, left hip: Secondary | ICD-10-CM | POA: Diagnosis not present

## 2018-10-07 DIAGNOSIS — M7541 Impingement syndrome of right shoulder: Secondary | ICD-10-CM

## 2018-10-07 NOTE — Progress Notes (Signed)
[name] Hurley - 48 y.o. female MRN [id number]  Date of birth: 1971/04/02  SUBJECTIVE:  Including CC & ROS.  Chief Complaint  Patient presents with  . Pain    left hip and right shoulder/left hip- 1 week/ right shoulder 2 years    [name] Hurley is a 48 y.o. female that is presenting with left lateral hip pain and right lateral shoulder pain.  Left hip pain is been ongoing for about 2 weeks.  She is tried over-the-counter remedies with no relief.  The pain is localized to the lateral aspect of the hip.  The pain is worse with walking or lying on the affected side.  The pain is moderate to severe.  It is affecting her walking.  It is worse when she is at work.  Pain is sharp and stabbing in nature.  Seems to be localized to this area.  Having right lateral shoulder pain.  Has been going on for 2 to 3 years.  Denies any specific inciting event.  Pain is worse with certain movements.  She has tried physical therapy with limited improvement.  No prior history of injection.  Localized to this area.  Pain is mild to moderate.  Localized to the lateral shoulder..  Independent review of the left hip x-ray from 3/4 shows a possible cam lesion with early degenerative changes.   Review of Systems  Constitutional: Negative for fever.  HENT: Negative for congestion.   Respiratory: Negative for cough.   Cardiovascular: Negative for chest pain.  Gastrointestinal: Negative for abdominal pain.  Musculoskeletal: Positive for gait problem.  Skin: Negative for color change.  Neurological: Negative for weakness.  Hematological: Negative for adenopathy.  Psychiatric/Behavioral: Negative for agitation.    HISTORY: Past Medical, Surgical, Social, and Family History Reviewed & Updated per EMR.   Pertinent Historical Findings include:  Past Medical History:  Diagnosis Date  . Diabetes mellitus   . Dyspareunia   . Hypertension   . Sciatica     Past Surgical History:  Procedure Laterality Date  .  CESAREAN SECTION    . paragard     removal 7-16 & reinsertion 02-13-15    No Known Allergies  Family History  Problem Relation Age of Onset  . Hypertension Father   . COPD Father   . Emphysema Father   . Diabetes Maternal Aunt   . Hypertension Mother   . Hyperlipidemia Mother   . Colon cancer Mother   . COPD Paternal Grandfather   . Hypertension Brother   . Heart disease Neg Hx      Social History   Socioeconomic History  . Marital status: Married    Spouse name: Not on file  . Number of children: 4  . Years of education: 40  . Highest education level: Not on file  Occupational History  . Occupation: Nutritition   Social Needs  . Financial resource strain: Not on file  . Food insecurity:    Worry: Not on file    Inability: Not on file  . Transportation needs:    Medical: Not on file    Non-medical: Not on file  Tobacco Use  . Smoking status: Current Every Day Smoker    Packs/day: 1.00    Years: 22.00    Pack years: 22.00    Types: Cigarettes  . Smokeless tobacco: Never Used  Substance and Sexual Activity  . Alcohol use: Not Currently  . Drug use: No  . Sexual activity: Yes    Partners: Male  Birth control/protection: I.U.D.  Lifestyle  . Physical activity:    Days per week: Not on file    Minutes per session: Not on file  . Stress: Not on file  Relationships  . Social connections:    Talks on phone: Not on file    Gets together: Not on file    Attends religious service: Not on file    Active member of club or organization: Not on file    Attends meetings of clubs or organizations: Not on file    Relationship status: Not on file  . Intimate partner violence:    Fear of current or ex partner: Not on file    Emotionally abused: Not on file    Physically abused: Not on file    Forced sexual activity: Not on file  Other Topics Concern  . Not on file  Social History Narrative   Born and raised in [place]. Fun: Sleep   Denies any religious beliefs  effecting health care.      PHYSICAL EXAM:  VS: BP (!) 140/94   Pulse 94   Temp 98.7 F (37.1 C) (Oral)   Ht 5' 5.5" (1.664 m)   Wt 180 lb 6.4 oz (81.8 kg)   SpO2 96%   BMI 29.56 kg/m  Physical Exam Gen: NAD, alert, cooperative with exam, well-appearing ENT: normal lips, normal nasal mucosa,  Eye: normal EOM, normal conjunctiva and lids CV:  no edema, +2 pedal pulses   Resp: no accessory muscle use, non-labored,  Skin: no rashes, no areas of induration  Neuro: normal tone, normal sensation to touch Psych:  normal insight, alert and oriented MSK:  Right Shoulder: Inspection reveals no abnormalities, atrophy or asymmetry. TTP over the lateral shoulder and AC joint  Pain with Hawkin's tests, empty can sign. Left Hip: No ecchymosis or swelling  Greater trochanter with tenderness to palpation. No tenderness over piriformis. Normal IR and ER  Normal strength to resistance with hip flexion  Weakness with hip abduction  Neurovascularly intact     Aspiration/Injection Procedure Note [name] Hurley [birth year]  Procedure: Injection Indications: left hip pain   Procedure Details Consent: Risks of procedure as well as the alternatives and risks of each were explained to the (patient/caregiver).  Consent for procedure obtained. Time Out: Verified patient identification, verified procedure, site/side was marked, verified correct patient position, special equipment/implants available, medications/allergies/relevent history reviewed, required imaging and test results available.  Performed.  The area was cleaned with iodine and alcohol swabs.    The left greater trochanteric bursa was injected using 1 cc's of 40 mg Depo-Medrol and 4 cc's of 0.25% bupivacaine with a 25 1 1/2" needle.  Ultrasound was used. Images were obtained in trans views showing the injection.     A sterile dressing was applied.  Patient did tolerate procedure well.     ASSESSMENT & PLAN:   Bursitis of left  hip Has some weakness with hip abduction.  Has an appearance of a possible cam lesion on x-ray which could be contributing. -Greater trochanteric injection today. -Counseled on home exercise therapy and supportive care. -If no improvement to consider physical therapy versus MRI  Shoulder impingement syndrome, right Does not have any significant limitations with external rotation.  Pain seems to beings to be more impingement related.  Has completed physical therapy. -Counseled on supportive care. -X-ray. -Can consider injection

## 2018-10-07 NOTE — Assessment & Plan Note (Signed)
Has some weakness with hip abduction.  Has an appearance of a possible cam lesion on x-ray which could be contributing. -Greater trochanteric injection today. -Counseled on home exercise therapy and supportive care. -If no improvement to consider physical therapy versus MRI

## 2018-10-07 NOTE — Assessment & Plan Note (Signed)
Does not have any significant limitations with external rotation.  Pain seems to beings to be more impingement related.  Has completed physical therapy. -Counseled on supportive care. -X-ray. -Can consider injection

## 2018-10-07 NOTE — Patient Instructions (Signed)
Nice to meet you  Please try the exercises  Please try heat on the area  I will call you with the results from today  Please see me back in 2-3 weeks if no better.

## 2018-10-11 ENCOUNTER — Telehealth: Payer: Self-pay | Admitting: Family Medicine

## 2018-10-11 NOTE — Telephone Encounter (Signed)
Left VM for patient. If she calls back please have her speak with a nurse/CMA and inform that her xray shows degenerative changes at the insertion of the rotator cuff which could mean rotator cuff tendinitis. The [facility] can report results to patient.   If any questions then please take the best time and phone number to call and I will try to call her back.   Myra Rude, MD [facility] 10/11/2018, 8:21 AM

## 2018-10-14 ENCOUNTER — Encounter: Payer: Self-pay | Admitting: Family Medicine

## 2018-10-15 NOTE — Telephone Encounter (Signed)
Letter completed, ready for pick up.

## 2018-11-03 ENCOUNTER — Telehealth: Payer: Self-pay | Admitting: Family Medicine

## 2018-11-03 NOTE — Telephone Encounter (Signed)
Called patient and left vm about appointment for 12/08/2018. Appointment needs to be changed to virtual visit due COVID-19

## 2018-11-08 ENCOUNTER — Telehealth: Payer: Self-pay

## 2018-11-08 NOTE — Telephone Encounter (Signed)
Received a [facility] call. Called pt back regarding changing their appointment on 5/13 into a video conference. Pt wanted to know if she needed this visit due to only needing refills of her medications. I advised pt that this was a follow up appointment for her diabetes and her refills and that she needed to keep this appointment. Pt agreed to making appointment into a doxy[facility]me appointment.

## 2018-11-11 ENCOUNTER — Telehealth: Payer: Self-pay | Admitting: Certified Nurse Midwife

## 2018-11-11 ENCOUNTER — Encounter: Payer: Self-pay | Admitting: Family Medicine

## 2018-11-11 ENCOUNTER — Other Ambulatory Visit: Payer: Self-pay

## 2018-11-11 DIAGNOSIS — Z794 Long term (current) use of insulin: Principal | ICD-10-CM

## 2018-11-11 DIAGNOSIS — E119 Type 2 diabetes mellitus without complications: Secondary | ICD-10-CM

## 2018-11-11 MED ORDER — GLUCOSE BLOOD VI STRP: ORAL_STRIP | 2 refills | Status: DC

## 2018-11-11 NOTE — Telephone Encounter (Signed)
Patient sent the following correspondence through MyChart. Routing to triage to assist patient with request.  Is it possible for you to send a refill prescription for Nystatin to the pharmacy for me .  I have that itch again no discharge or odor just the itch. I have done the oatmeal bath but it only relieve some.

## 2018-11-11 NOTE — Telephone Encounter (Signed)
Call to patient. Per ROI, can leave message on voice mail.  Left message to call back to triage nurse.  

## 2018-11-11 NOTE — Progress Notes (Signed)
Pt called office to request refill Rx of One Touch Ultra Test strips. Pt has had recent HA1-C lab/OV in 09/2018.

## 2018-11-12 MED ORDER — NYSTATIN 100000 UNIT/GM EX CREA: 1.0000 "application " | TOPICAL_CREAM | Freq: Two times a day (BID) | CUTANEOUS | 0 refills | Status: DC

## 2018-11-12 NOTE — Telephone Encounter (Signed)
Please inform the patient the nystatin was sent in.

## 2018-11-12 NOTE — Telephone Encounter (Signed)
Patient is returning a call to Kaitlyn. °

## 2018-11-12 NOTE — Telephone Encounter (Signed)
Spoke with patient. 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 States this has been going on for many years and Leota Sauers CNM gave her an rx for Nystatin to use as needed. Patient has ran out of the medication and is asking for a refill. Reports she is having external vaginal itching. Denies any vaginal discharge, odor, or urinary symptoms. Advised will review with covering MD and return call.

## 2018-11-15 NOTE — Telephone Encounter (Signed)
Spoke with patient, advised as seen below per Dr. Reyne Dumas. Patient states she has picked up and started Rx, symptoms are improving. Patient thankful for return call.  Routing to provider for final review. Patient is agreeable to disposition. Will close encounter.

## 2018-12-08 ENCOUNTER — Encounter: Payer: Self-pay | Admitting: Family Medicine

## 2018-12-08 ENCOUNTER — Ambulatory Visit (INDEPENDENT_AMBULATORY_CARE_PROVIDER_SITE_OTHER): Payer: BC Managed Care – PPO | Admitting: Family Medicine

## 2018-12-08 VITALS — BP 159/87 | HR 90 | Temp 98.1°F | Ht 64.0 in | Wt 185.0 lb

## 2018-12-08 DIAGNOSIS — Z794 Long term (current) use of insulin: Secondary | ICD-10-CM | POA: Diagnosis not present

## 2018-12-08 DIAGNOSIS — I10 Essential (primary) hypertension: Secondary | ICD-10-CM

## 2018-12-08 DIAGNOSIS — F17210 Nicotine dependence, cigarettes, uncomplicated: Secondary | ICD-10-CM

## 2018-12-08 DIAGNOSIS — E119 Type 2 diabetes mellitus without complications: Secondary | ICD-10-CM

## 2018-12-08 MED ORDER — PRAVASTATIN SODIUM 10 MG PO TABS: 10.0000 mg | ORAL_TABLET | Freq: Every day | ORAL | 2 refills | Status: DC

## 2018-12-08 MED ORDER — INSULIN DEGLUDEC 100 UNIT/ML ~~LOC~~ SOPN: 20.0000 [IU] | PEN_INJECTOR | Freq: Every day | SUBCUTANEOUS | 1 refills | Status: DC

## 2018-12-08 MED ORDER — EMPAGLIFLOZIN 10 MG PO TABS: 10.0000 mg | ORAL_TABLET | Freq: Every day | ORAL | 1 refills | Status: DC

## 2018-12-08 MED ORDER — VARENICLINE TARTRATE 0.5 MG X 11 & 1 MG X 42 PO MISC: ORAL | 0 refills | Status: DC

## 2018-12-08 MED ORDER — LOSARTAN POTASSIUM 25 MG PO TABS: 25.0000 mg | ORAL_TABLET | Freq: Every day | ORAL | 2 refills | Status: DC

## 2018-12-08 MED ORDER — LIRAGLUTIDE 18 MG/3ML ~~LOC~~ SOPN: PEN_INJECTOR | SUBCUTANEOUS | 1 refills | Status: DC

## 2018-12-08 NOTE — Progress Notes (Signed)
[name] Hurley - 48 y.o. female MRN [id number]  Date of birth: 13-Aug-1970   This visit type was conducted due to national recommendations for restrictions regarding the COVID-19 Pandemic (e.g. social distancing).  This format is felt to be most appropriate for this patient at this time.  All issues noted in this document were discussed and addressed.  No physical exam was performed (except for noted visual exam findings with Video Visits).  I discussed the limitations of evaluation and management by telemedicine and the availability of in person appointments. The patient expressed understanding and agreed to proceed.  I connected with@ on 12/08/18 at  4:00 PM EDT by a video enabled telemedicine application and verified that I am speaking with the correct person using two identifiers.   Patient Location: [facility] [address] [place] [id number]   [facility]  Chief Complaint  Patient presents with  . Follow-up    3 mo F/U DM , meds ok, FGS 120's-160's    HPI  [name] Hurley is a 48 y.o. female who presents via [employer]/video conferencing for a telehealth visit today.  She is following up today for diabetes, HTN and smoking.    -T2DM:  Current treatment with tresiba, jardiance, victoza and metformin.  She reports she is doing well with current medications. FBG at home typically around 120.  She denies symptoms of hypoglycemia.  She has found it more difficult to maintain a healthy diet during COVID quarantine but is trying to stay active.    -HTN:  BP has been well controlled with losartan. She denies symptoms of hypotension.  She has not had chest pain, shortness of breath, palpitations, or headache.   -Nicotine use:  Continues to smoke which as increased from 1ppd to 1.5-2 ppd during quarantine.  She would like to quit.  She has had success with chantix in the past and would like to try this again.  She denies side effects from this previously.     ROS:  A  comprehensive ROS was completed and negative except as noted per HPI  Past Medical History:  Diagnosis Date  . Diabetes mellitus   . Dyspareunia   . Hypertension   . Sciatica     Past Surgical History:  Procedure Laterality Date  . CESAREAN SECTION    . paragard     removal 7-16 & reinsertion 02-13-15    Family History  Problem Relation Age of Onset  . Hypertension Father   . COPD Father   . Emphysema Father   . Diabetes Maternal Aunt   . Hypertension Mother   . Hyperlipidemia Mother   . Colon cancer Mother   . COPD Paternal Grandfather   . Hypertension Brother   . Heart disease Neg Hx     Social History   Socioeconomic History  . Marital status: Married    Spouse name: Not on file  . Number of children: 4  . Years of education: 5314  . Highest education level: Not on file  Occupational History  . Occupation: Nutritition   Social Needs  . Financial resource strain: Not on file  . Food insecurity:    Worry: Not on file    Inability: Not on file  . Transportation needs:    Medical: Not on file    Non-medical: Not on file  Tobacco Use  . Smoking status: Current Every Day Smoker    Packs/day: 1.00    Years: 22.00    Pack years:  22.00    Types: Cigarettes  . Smokeless tobacco: Never Used  Substance and Sexual Activity  . Alcohol use: Not Currently  . Drug use: No  . Sexual activity: Yes    Partners: Male    Birth control/protection: I.U.D.  Lifestyle  . Physical activity:    Days per week: Not on file    Minutes per session: Not on file  . Stress: Not on file  Relationships  . Social connections:    Talks on phone: Not on file    Gets together: Not on file    Attends religious service: Not on file    Active member of club or organization: Not on file    Attends meetings of clubs or organizations: Not on file    Relationship status: Not on file  . Intimate partner violence:    Fear of current or ex partner: Not on file    Emotionally abused: Not on  file    Physically abused: Not on file    Forced sexual activity: Not on file  Other Topics Concern  . Not on file  Social History Narrative   Born and raised in [place]. Fun: Sleep   Denies any religious beliefs effecting health care.      Current Outpatient Medications:  .  empagliflozin (JARDIANCE) 10 MG TABS tablet, Take 10 mg by mouth daily., Disp: 90 tablet, Rfl: 1 .  Fluticasone Furoate-Vilanterol (BREO ELLIPTA) 100-25 MCG/INH AEPB, Inhale 1 puff into the lungs daily as needed., Disp: 28 each, Rfl: 0 .  glucose blood (ONE TOUCH ULTRA TEST) test strip, Use as instructed to check blood sugar 2 times per day dx code 250.02, Disp: 100 each, Rfl: 2 .  insulin degludec (TRESIBA FLEXTOUCH) 100 UNIT/ML SOPN FlexTouch Pen, Inject 0.2 mLs (20 Units total) into the skin daily., Disp: 5 pen, Rfl: 1 .  liraglutide (VICTOZA) 18 MG/3ML SOPN, INJECT 1.2MG  SUBCUTANEOUSLY DAILY AT THE SAME TIME EACH DAY, Disp: 18 mL, Rfl: 1 .  losartan (COZAAR) 25 MG tablet, Take 1 tablet (25 mg total) by mouth daily., Disp: 90 tablet, Rfl: 2 .  metFORMIN (GLUCOPHAGE) 1000 MG tablet, Take 1 tablet (1,000 mg total) by mouth 2 (two) times daily with a meal., Disp: 180 tablet, Rfl: 3 .  nystatin cream (MYCOSTATIN), Apply 1 application topically 2 (two) times daily. Apply to affected area BID for up to 7 days., Disp: 30 g, Rfl: 0 .  pravastatin (PRAVACHOL) 10 MG tablet, Take 1 tablet (10 mg total) by mouth daily., Disp: 90 tablet, Rfl: 2 .  varenicline (CHANTIX STARTING MONTH PAK) 0.5 MG X 11 & 1 MG X 42 tablet, Take one 0.5 mg tab PO once daily x3 days, then increase to one 0.5 mg tab twice daily x4 days, then increase to one 1 mg tab twice daily., Disp: 53 tablet, Rfl: 0  EXAM:  VITALS per patient if applicable: BP (!) 159/87 Comment: reports from [facility] , just finished smoking cigerrette.  Pulse 90   Temp 98.1 F (36.7 C) (Oral)   Ht  (1.626 m)   Wt 185 lb (83.9 kg) Comment: pt reports from [facility]  BMI 31.76 kg/m    GENERAL: alert, oriented, appears well and in no acute distress  HEENT: atraumatic, conjunttiva clear, no obvious abnormalities on inspection of external nose and ears  NECK: normal movements of the head and neck  LUNGS: on inspection no signs of respiratory distress, breathing rate appears normal, no obvious gross SOB, gasping or wheezing  CV: no obvious cyanosis  MS: moves all visible extremities without noticeable abnormality  PSYCH/NEURO: pleasant and cooperative, no obvious depression or anxiety, speech and thought processing grossly intact  ASSESSMENT AND PLAN:  Discussed the following assessment and plan:  Cigarette nicotine dependence without complication -Counseling provided for smoking cessation -Will start chantix -Follow up in 3 months.   Type 2 diabetes mellitus without complication, with long-term current use of insulin (HCC) Lab Results  Component Value Date   HGBA1C 8.9 (H) 09/29/2018   She will work on dietary changes to improve blood glucose.  Continue current medications and follow up in 3 months.   Essential hypertension -Stable with losartan, continue.        I discussed the assessment and treatment plan with the patient. The patient was provided an opportunity to ask questions and all were answered. The patient agreed with the plan and demonstrated an understanding of the instructions.   The patient was advised to call back or seek an in-person evaluation if the symptoms worsen or if the condition fails to improve as anticipated.     Everrett Coombe, DO

## 2018-12-10 ENCOUNTER — Encounter: Payer: Self-pay | Admitting: Family Medicine

## 2018-12-10 NOTE — Assessment & Plan Note (Signed)
Lab Results  Component Value Date   HGBA1C 8.9 (H) 09/29/2018   She will work on dietary changes to improve blood glucose.  Continue current medications and follow up in 3 months.

## 2018-12-10 NOTE — Assessment & Plan Note (Signed)
-  Counseling provided for smoking cessation -Will start chantix -Follow up in 3 months.

## 2018-12-10 NOTE — Assessment & Plan Note (Signed)
-  Stable with losartan, continue.

## 2019-01-18 ENCOUNTER — Encounter: Payer: Self-pay | Admitting: Certified Nurse Midwife

## 2019-01-18 ENCOUNTER — Ambulatory Visit (INDEPENDENT_AMBULATORY_CARE_PROVIDER_SITE_OTHER): Payer: BC Managed Care – PPO | Admitting: Certified Nurse Midwife

## 2019-01-18 ENCOUNTER — Other Ambulatory Visit: Payer: Self-pay

## 2019-01-18 ENCOUNTER — Telehealth: Payer: Self-pay | Admitting: Certified Nurse Midwife

## 2019-01-18 VITALS — BP 120/82 | HR 70 | Temp 97.6°F | Resp 16 | Wt 179.0 lb

## 2019-01-18 DIAGNOSIS — B3731 Acute candidiasis of vulva and vagina: Secondary | ICD-10-CM

## 2019-01-18 DIAGNOSIS — B373 Candidiasis of vulva and vagina: Secondary | ICD-10-CM

## 2019-01-18 MED ORDER — FLUCONAZOLE 150 MG PO TABS: ORAL_TABLET | ORAL | 0 refills | Status: DC

## 2019-01-18 NOTE — Telephone Encounter (Signed)
Spoke with patient. Patient reports external and internal vaginal itching that has become more constant, not relieved with nystatin cream or OTC medications. Skin is irritated and burns when urine touches it. Denies vaginal odor, pain, bleeding or d/c.   Recommended OV for further evaluation, OV scheduled for today at 4:30pm with Melvia Heaps, CNM. [id number] prescreen negative, precautions reviewed.  Patient verbalizes understanding and is agreeable.   Encounter closed.

## 2019-01-18 NOTE — Progress Notes (Signed)
48 y.o. Married [place] American female G1P1001 here with complaint of vaginal symptoms of itching, burning, and some increase discharge. Describes discharge as white. "feels like the problem treated last year." Has tried [employer] bath with some relief, but itching continues. Patient is diabetic and glucose levels have not been as normal as she should be. Working on with diet and medication. Onset of symptoms 10 days ago. Denies new personal products or vaginal dryness. . Urinary symptoms none . Contraception is Mirena IUD due for removal 02/13/2020. No other health issues today.  Review of Systems  Constitutional: Negative.   HENT: Negative.   Eyes: Negative.   Respiratory: Negative.   Cardiovascular: Negative.   Gastrointestinal: Negative.   Genitourinary: Negative.   Musculoskeletal: Negative.   Skin:       Vaginal irritation & itching  Neurological: Negative.   Endo/Heme/Allergies: Negative.   Psychiatric/Behavioral: Negative.     O:Healthy female WDWN Affect: normal, orientation x 3  Exam:Skin:warm and dry Abdomen: soft, non tender  Inguinal Lymph nodes: no enlargement or tenderness Pelvic exam: External genital: normal female with edema,scaling and cracked areas noted on vulva bilateral, wet prep taken BUS: negative Vagina: white thick slightly odorous discharge noted. Ph:4.0   ,Wet prep taken, Affirm taken Cervix: normal, non tender, no CMT, IUD string noted Uterus: normal, non tender Adnexa:normal, non tender, no masses or fullness noted  Wet Prep results: Koh, Saline + yeast    A:Normal pelvic exam Yeast vaginitis/vulvitis Diabetic type 2, not in good control per patient   P:Discussed findings of yeast vaginitis/vulvitis and etiology. Discussed Aveeno or baking soda sitz bath continuation for comfort. Avoid moist clothes or pads for extended period of time. Discussed need to work on getting glucose level more in control because this does increase risk of yeast  infection.Discussed topical Monistat derm to help with external irritation. Questions addressed. Lab: Affirm Rx Diflucan see order with instructions   Rv prn

## 2019-01-18 NOTE — Telephone Encounter (Signed)
Patient returning call.

## 2019-01-18 NOTE — Telephone Encounter (Signed)
Patient sent the following correspondence through [place]. Routing to triage to assist patient with request.  Is there anything else that I can get for this vaginal itch it has returned and has not gone away for a while now I have tried multiple over-the-counter itch creams nothing has worked.

## 2019-01-18 NOTE — Telephone Encounter (Signed)
Left message to call Billi Bright, RN at [facility] [phone].   MyChart message to patient.     

## 2019-01-20 LAB — VAGINITIS/VAGINOSIS, DNA PROBE
Candida Species: NEGATIVE
Gardnerella vaginalis: NEGATIVE
Trichomonas vaginosis: NEGATIVE

## 2019-02-07 ENCOUNTER — Encounter: Payer: Self-pay | Admitting: Family Medicine

## 2019-02-08 ENCOUNTER — Telehealth: Payer: Self-pay

## 2019-02-08 NOTE — Telephone Encounter (Signed)
Questions for Screening COVID-19  Symptom onset: None , Pt repeated the ph # [phone].   Travel or Contacts: None   During this illness, did/does the patient experience any of the following symptoms? Fever >100.21F []   Yes [x]   No []   Unknown Subjective fever (felt feverish) []   Yes []   No []   Unknown Chills []   Yes [x]   No []   Unknown Muscle aches (myalgia) []   Yes [x]   No []   Unknown Runny nose (rhinorrhea) []   Yes [x]   No []   Unknown Sore throat []   Yes []   No []   Unknown Cough (new onset or worsening of chronic cough) []   Yes [x]   No []   Unknown Shortness of breath (dyspnea) []   Yes [x]   No []   Unknown Nausea or vomiting []   Yes [x]   No []   Unknown Headache []   Yes []   No [x]   Unknown Abdominal pain  []   Yes [x]   No []   Unknown Diarrhea (?3 loose/looser than normal stools/24hr period) []   Yes [x]   No []   Unknown Other, specify:  Patient risk factors: Smoker? []   Current []   Former [x]   Never If female, currently pregnant? []   Yes [x]   No  Patient Active Problem List   Diagnosis Date Noted  . Bursitis of left hip 09/29/2018  . Cigarette nicotine dependence without complication 89/16/9450  . Shoulder impingement syndrome, right 11/19/2017  . Essential hypertension 11/19/2017  . Dyslipidemia 09/19/2015  . Muscle cramp 10/17/2014  . Type 2 diabetes mellitus without complication, with long-term current use of insulin ([facility]) 11/24/2013    Plan:  []   High risk for COVID-19 with red flags go to ED (with CP, SOB, weak/lightheaded, or fever > 101.5). Call ahead.  []   High risk for COVID-19 but stable. Inform provider and coordinate time for [facility] visit.   [x]   No red flags but URI signs or symptoms okay for [facility] visit.

## 2019-02-09 ENCOUNTER — Ambulatory Visit: Payer: BC Managed Care – PPO | Admitting: Family Medicine

## 2019-02-09 ENCOUNTER — Encounter: Payer: Self-pay | Admitting: Family Medicine

## 2019-02-09 VITALS — BP 122/86 | HR 86 | Temp 97.7°F | Resp 18 | Ht 64.0 in | Wt 182.0 lb

## 2019-02-09 DIAGNOSIS — S91109A Unspecified open wound of unspecified toe(s) without damage to nail, initial encounter: Secondary | ICD-10-CM | POA: Diagnosis not present

## 2019-02-09 DIAGNOSIS — Z794 Long term (current) use of insulin: Secondary | ICD-10-CM

## 2019-02-09 DIAGNOSIS — I10 Essential (primary) hypertension: Secondary | ICD-10-CM | POA: Diagnosis not present

## 2019-02-09 DIAGNOSIS — T148XXA Other injury of unspecified body region, initial encounter: Secondary | ICD-10-CM | POA: Diagnosis not present

## 2019-02-09 DIAGNOSIS — E119 Type 2 diabetes mellitus without complications: Secondary | ICD-10-CM

## 2019-02-09 MED ORDER — MUPIROCIN 2 % EX OINT: 1.0000 "application " | TOPICAL_OINTMENT | Freq: Two times a day (BID) | CUTANEOUS | 0 refills | Status: AC

## 2019-02-09 MED ORDER — MUPIROCIN 2 % EX OINT: 1.0000 "application " | TOPICAL_OINTMENT | Freq: Two times a day (BID) | CUTANEOUS | 0 refills | Status: DC

## 2019-02-09 NOTE — Assessment & Plan Note (Signed)
-  Historically poorly controlled, update a1c today.  -Encouraged to work on improving diet.   -Incorporate regular exercise.

## 2019-02-09 NOTE — Assessment & Plan Note (Signed)
-  BP is well managed at this time.  Continue current medication.

## 2019-02-09 NOTE — Assessment & Plan Note (Signed)
Scratch with mild surrounding erythema.  Will add on topical mupirocin to apply to area.

## 2019-02-09 NOTE — Assessment & Plan Note (Signed)
-  I recommended that she see her podiatrist for follow up of this are.  Likely needs debridement, possible underlying ulceration.  No signs of infection at this time.

## 2019-02-09 NOTE — Progress Notes (Signed)
[name] Hurley - 48 y.o. female MRN [id number]  Date of birth: 08-13-1970  Subjective Chief Complaint  Patient presents with  . Wound Check    R 2nd digit toe -injury Feb 2020, black w/pain,LLL lateral non-healing wound    HPI [name] Hurley is a 48 y.o. female with history of HTN, T2DM and nicotine dependence here today for f/u of DM and and complaint of sore on toe.    -T2DM:  Reports poor compliance with diet over the past few months.  She is taking all medications as directed.  No recent home glucose check.  Denies polyuria/polydipsia.  Has sore on R 2nd digit of foot.  Reports hitting toe on fireplace in February.  Noticed the other day when painting nails, doesn't think that it ever healed completely.  Area is a little tender.  She is being seen by podiatry as well for tendon issues with her toes but hasn't mentioned sore on the toe to him.  She denies numbness or tingling her her feet.  She also has a dog scratch on the L leg that is slow to heal.    -HTN:  Current treatment with losartan.  She denies side effects including symptoms of hypotension.  She has not had chest pain, shortness of breath, headache or vision changes.   ROS:  A comprehensive ROS was completed and negative except as noted per HPI  No Known Allergies  Past Medical History:  Diagnosis Date  . Diabetes mellitus   . Dyspareunia   . Hypertension   . Sciatica     Past Surgical History:  Procedure Laterality Date  . CESAREAN SECTION    . paragard     removal 7-16 & reinsertion 02-13-15    Social History   Socioeconomic History  . Marital status: Married    Spouse name: Not on file  . Number of children: 4  . Years of education: 27  . Highest education level: Not on file  Occupational History  . Occupation: Nutritition   Social Needs  . Financial resource strain: Not on file  . Food insecurity    Worry: Not on file    Inability: Not on file  . Transportation needs    Medical: Not on file   Non-medical: Not on file  Tobacco Use  . Smoking status: Current Every Day Smoker    Packs/day: 1.00    Years: 22.00    Pack years: 22.00    Types: Cigarettes  . Smokeless tobacco: Never Used  Substance and Sexual Activity  . Alcohol use: Not Currently  . Drug use: No  . Sexual activity: Yes    Partners: Male    Birth control/protection: I.U.D.  Lifestyle  . Physical activity    Days per week: Not on file    Minutes per session: Not on file  . Stress: Not on file  Relationships  . Social Herbalist on phone: Not on file    Gets together: Not on file    Attends religious service: Not on file    Active member of club or organization: Not on file    Attends meetings of clubs or organizations: Not on file    Relationship status: Not on file  Other Topics Concern  . Not on file  Social History Narrative   Born and raised in [place]. Fun: Sleep   Denies any religious beliefs effecting health care.     Family History  Problem Relation Age of Onset  .  Hypertension Father   . COPD Father   . Emphysema Father   . Diabetes Maternal Aunt   . Hypertension Mother   . Hyperlipidemia Mother   . Colon cancer Mother   . COPD Paternal Grandfather   . Hypertension Brother   . Heart disease Neg Hx     Health Maintenance  Topic Date Due  . HIV Screening  01/05/1984  . OPHTHALMOLOGY EXAM  09/11/2018  . FOOT EXAM  11/20/2018  . MAMMOGRAM  01/05/2019  . COLONOSCOPY  01/05/2019  . TETANUS/TDAP  03/16/2019 (Originally 01/05/1988)  . INFLUENZA VACCINE  02/26/2019  . HEMOGLOBIN A1C  04/01/2019  . PAP SMEAR-Modifier  02/11/2020  . PNEUMOCOCCAL POLYSACCHARIDE VACCINE AGE 45-64 HIGH RISK  Completed    ----------------------------------------------------------------------------------------------------------------------------------------------------------------------------------------------------------------- Physical Exam BP 122/86   Pulse 86   Temp 97.7 F (36.5 C) (Oral)    Resp 18   Ht 5\' 4"  (1.626 m)   Wt 182 lb (82.6 kg)   SpO2 98%   BMI 31.24 kg/m   Physical Exam Constitutional:      Appearance: Normal appearance.  HENT:     Head: Normocephalic and atraumatic.  Eyes:     General: No scleral icterus. Neck:     Musculoskeletal: Neck supple.  Cardiovascular:     Rate and Rhythm: Normal rate and regular rhythm.     Pulses:          Dorsalis pedis pulses are 1+ on the right side and 1+ on the left side.       Posterior tibial pulses are 1+ on the right side and 1+ on the left side.  Pulmonary:     Effort: Pulmonary effort is normal.     Breath sounds: Normal breath sounds.  Musculoskeletal:     Right foot: Normal range of motion. No deformity.     Left foot: Normal range of motion. No deformity.  Feet:     Right foot:     Toenail Condition: Right toenails are abnormally thick.     Comments: Small eschar covering proximal nail fold of 2nd digit on R foot.  No drainage or surrounding erythema.  Area is tender.   Neurological:     General: No focal deficit present.     Mental Status: She is alert.  Psychiatric:        Mood and Affect: Mood normal.        Behavior: Behavior normal.     ------------------------------------------------------------------------------------------------------------------------------------------------------------------------------------------------------------------- Assessment and Plan  Type 2 diabetes mellitus without complication, with long-term current use of insulin (HCC) -Historically poorly controlled, update a1c today.  -Encouraged to work on improving diet.   -Incorporate regular exercise.   Essential hypertension -BP is well managed at this time.  Continue current medication.   Open wound of toe -I recommended that she see her podiatrist for follow up of this are.  Likely needs debridement, possible underlying ulceration.  No signs of infection at this time.   Animal scratch Scratch with mild  surrounding erythema.  Will add on topical mupirocin to apply to area.

## 2019-02-09 NOTE — Patient Instructions (Signed)
Please follow up with your podiatrist for your toe.   Use topical antibiotic ointment to areas on leg  We'll call you with lab results.

## 2019-02-10 LAB — HEMOGLOBIN A1C: Hgb A1c MFr Bld: 10 % — ABNORMAL HIGH (ref 4.6–6.5)

## 2019-02-10 LAB — BASIC METABOLIC PANEL
BUN: 21 mg/dL (ref 6–23)
CO2: 23 mEq/L (ref 19–32)
Calcium: 9.7 mg/dL (ref 8.4–10.5)
Chloride: 106 mEq/L (ref 96–112)
Creatinine, Ser: 1.17 mg/dL (ref 0.40–1.20)
GFR: 59.22 mL/min — ABNORMAL LOW (ref >60.00)
Glucose, Bld: 104 mg/dL — ABNORMAL HIGH (ref 70–99)
Potassium: 4.4 mEq/L (ref 3.5–5.1)
Sodium: 139 mEq/L (ref 135–145)

## 2019-02-14 ENCOUNTER — Ambulatory Visit: Payer: BC Managed Care – PPO | Admitting: Podiatry

## 2019-02-14 ENCOUNTER — Ambulatory Visit (INDEPENDENT_AMBULATORY_CARE_PROVIDER_SITE_OTHER): Payer: BC Managed Care – PPO

## 2019-02-14 ENCOUNTER — Other Ambulatory Visit: Payer: Self-pay

## 2019-02-14 ENCOUNTER — Encounter: Payer: Self-pay | Admitting: Podiatry

## 2019-02-14 ENCOUNTER — Other Ambulatory Visit: Payer: Self-pay | Admitting: Podiatry

## 2019-02-14 VITALS — Temp 97.5°F

## 2019-02-14 DIAGNOSIS — M2041 Other hammer toe(s) (acquired), right foot: Secondary | ICD-10-CM

## 2019-02-14 DIAGNOSIS — S9031XA Contusion of right foot, initial encounter: Secondary | ICD-10-CM

## 2019-02-14 DIAGNOSIS — M79671 Pain in right foot: Secondary | ICD-10-CM

## 2019-02-14 NOTE — Progress Notes (Signed)
A1c is up.  Recommend increasing tresiba to 25 units.  Follow low carbohydrate diet with regular exercise.

## 2019-02-15 NOTE — Progress Notes (Signed)
Subjective:   Patient ID: [name] Hurley, female   DOB: 48 y.o.   MRN: [id number]   HPI Patient presents with digital deformities of the lesser digit right and also injured her right second toe several months ago and it is been crusted and occasionally tender.  Patient sugar has not been running well and currently she is running an A1c of around 10.  Patient continues to smoke approximately a pack a day   Review of Systems  All other systems reviewed and are negative.       Objective:  Physical Exam Vitals signs and nursing note reviewed.  Constitutional:      Appearance: She is well-developed.  Pulmonary:     Effort: Pulmonary effort is normal.  Musculoskeletal: Normal range of motion.  Skin:    General: Skin is warm.  Neurological:     Mental Status: She is alert.     Neurovascular status was found to be intact with good range of motion of the subtalar midtarsal joint with hammertoe deformity of the lesser digit and crusted formation of the distal second digit right with keratotic tissue formation that is moderately tender when palpated.  Patient does have mild neurological issues and admits that she needs to stop smoking and get her sugar under better control     Assessment:  Upper toe deformity with crusted tissue formation that is local with no indications of other pathology     Plan:  H&P condition reviewed explained and I discussed with her the importance of controlling her sugar.  Patient understands for surgery she is can have to do a better job of controlling her sugar and she promises to work at it over the next few months and today using sterile instrumentation I debrided the lesion with no breakdown of tissue and it should heal uneventfully at this position.  Any problems she will let us know if not she will seem to be seen back after he has her next A1c and if it comes down then we will be able to do surgical intervention on this case  X-rays indicate that there is  digital deformities lesser digit with no indications currently of bony lysis

## 2019-03-15 ENCOUNTER — Encounter (INDEPENDENT_AMBULATORY_CARE_PROVIDER_SITE_OTHER): Payer: Self-pay | Admitting: Family Medicine

## 2019-03-26 ENCOUNTER — Ambulatory Visit (FREE_STANDING_LABORATORY_FACILITY): Payer: Commercial Managed Care - POS

## 2019-03-26 DIAGNOSIS — Z1159 Encounter for screening for other viral diseases: Secondary | ICD-10-CM

## 2019-03-26 DIAGNOSIS — Z01818 Encounter for other preprocedural examination: Secondary | ICD-10-CM

## 2019-03-27 LAB — COVID-19 (SARS-COV-2): SARS CoV 2 Overall Result: NOT DETECTED

## 2019-03-30 NOTE — Progress Notes (Signed)
48 y.o. G73P1001 Married  African American Fe here for annual exam. Periods normal monthly and then missed periods in June, July. Period in August was heavier and darker with 9 day duration.  Contraception is Paragard IUD, due for removal 02/12/2025. Vaginal yeast infection from 01/18/2019 cleared now with Diflucan use weekly x 4. Still using clotrimazole topically if flares and Aveeno Sitz bath prn. Denies any vaginal itching or increase discharge now. "Feels so much better" Glucose is more stable now with her diabetes. Has appointment with PCP in 04/2019 for labs, aex, medication management of diabetes type 1, cholesterol and hypertension. All stable now per patient.  LMP 03/16/2019  Paragard due for removal in  02/12/2025    Sexually active: Yes.    The current method of family planning is IUD.    Exercising: Yes.    occasionally walking Smoker:  Former smoker   Review of Systems  Constitutional: Negative.   HENT: Negative.   Eyes: Negative.   Respiratory: Negative.   Cardiovascular: Negative.   Gastrointestinal: Negative.   Genitourinary: Negative.   Musculoskeletal: Negative.   Skin: Negative.   Neurological: Negative.   Endo/Heme/Allergies: Negative.   Psychiatric/Behavioral: Negative.     Health Maintenance: Pap:  02-10-17 neg History of Abnormal Pap: no MMG:  02-22-15 category a density neg Self Breast exams: yes Colonoscopy:  2005 neg per patient BMD:   none TDaP:  Pt declines update in the past Shingles: no Pneumonia: 2019 Hep C and HIV: HIV neg yrs ago per patient, Hep c neg 2017 Labs: PCP   reports that she has been smoking cigarettes. She has a 22.00 pack-year smoking history. She has never used smokeless tobacco. She reports previous alcohol use. She reports that she does not use drugs.  Past Medical History:  Diagnosis Date  . Diabetes mellitus   . Dyspareunia   . Hypertension   . Sciatica     Past Surgical History:  Procedure Laterality Date  . CESAREAN  SECTION    . paragard     removal 7-16 & reinsertion 02-13-15    Current Outpatient Medications  Medication Sig Dispense Refill  . empagliflozin (JARDIANCE) 10 MG TABS tablet Take 10 mg by mouth daily. 90 tablet 1  . Fluticasone Furoate-Vilanterol (BREO ELLIPTA) 100-25 MCG/INH AEPB Inhale 1 puff into the lungs daily as needed. 28 each 0  . glucose blood (ONE TOUCH ULTRA TEST) test strip Use as instructed to check blood sugar 2 times per day dx code 250.02 100 each 2  . insulin degludec (TRESIBA FLEXTOUCH) 100 UNIT/ML SOPN FlexTouch Pen Inject 0.2 mLs (20 Units total) into the skin daily. 5 pen 1  . liraglutide (VICTOZA) 18 MG/3ML SOPN INJECT 1.2MG  SUBCUTANEOUSLY DAILY AT THE SAME TIME EACH DAY 18 mL 1  . losartan (COZAAR) 25 MG tablet Take 1 tablet (25 mg total) by mouth daily. 90 tablet 2  . metFORMIN (GLUCOPHAGE) 1000 MG tablet Take 1 tablet (1,000 mg total) by mouth 2 (two) times daily with a meal. 180 tablet 3  . mupirocin ointment (BACTROBAN) 2 % Apply 1 application topically 2 (two) times daily. To leg 22 g 0  . pravastatin (PRAVACHOL) 10 MG tablet Take 1 tablet (10 mg total) by mouth daily. 90 tablet 2  . varenicline (CHANTIX STARTING MONTH PAK) 0.5 MG X 11 & 1 MG X 42 tablet Take one 0.5 mg tab PO once daily x3 days, then increase to one 0.5 mg tab twice daily x4 days, then increase to one  1 mg tab twice daily. 53 tablet 0   No current facility-administered medications for this visit.     Family History  Problem Relation Age of Onset  . Hypertension Father   . COPD Father   . Emphysema Father   . Diabetes Maternal Aunt   . Hypertension Mother   . Hyperlipidemia Mother   . Colon cancer Mother   . COPD Paternal Grandfather   . Hypertension Brother   . Heart disease Neg Hx     ROS:  Pertinent items are noted in HPI.  Otherwise, a comprehensive ROS was negative.  Exam:   BP 138/80 (BP Location: Right Arm, Patient Position: Sitting, Cuff Size: Normal)   Pulse 90   Temp (!)  97.2 F (36.2 C) (Skin)   Resp 16   Ht 5\' 5"  (1.651 m)   Wt 183 lb 1.6 oz (83.1 kg)   BMI 30.47 kg/m  Height: 5\' 5"  (165.1 cm) Ht Readings from Last 3 Encounters:  04/05/19 5\' 5"  (1.651 m)  02/09/19 5\' 4"  (1.626 m)  12/08/18 5\' 4"  (1.626 m)    General appearance: alert, cooperative and appears stated age Head: Normocephalic, without obvious abnormality, atraumatic Neck: no adenopathy, supple, symmetrical, trachea midline and thyroid normal to inspection and palpation Lungs: clear to auscultation bilaterally Breasts: normal appearance, no masses or tenderness, No nipple retraction or dimpling, No nipple discharge or bleeding, No axillary or supraclavicular adenopathy Heart: regular rate and rhythm Abdomen: soft, non-tender; no masses,  no organomegaly Extremities: extremities normal, atraumatic, no cyanosis or edema Skin: Skin color, texture, turgor normal. No rashes or lesions Lymph nodes: Cervical, supraclavicular, and axillary nodes normal. No abnormal inguinal nodes palpated Neurologic: Grossly normal   Pelvic: External genitalia:  no lesions              Urethra:  normal appearing urethra with no masses, tenderness or lesions              Bartholin's and Skene's: normal                 Vagina: normal appearing vagina with normal color and discharge, no lesions              Cervix: no cervical motion tenderness and no lesions              Pap taken: No. Bimanual Exam:  Uterus:  normal size, contour, position, consistency, mobility, non-tender and anteverted              Adnexa: normal adnexa and no mass, fullness, tenderness               Rectovaginal: Confirms               Anus:  normal sphincter tone, no lesions  Chaperone present: yes  A:  Well Woman with normal exam  Contraception Paragard IUD  Cycle change with missed periods, no other symptoms  Diabetes Type 1, Hypertension, Cholesterol management with PCP  Colonoscopy due  Mammogram due  P:   Reviewed health  and wellness pertinent to exam  Reviewed period profile with Paragard IUD and need to advise if misses 2 periods  again. Warning signs with heavy bleeding given and need to advise. Discussed perimenopausal changes with cycle and importance of keeping menstrual calendar. Patient will try to do this.  Paragard IUD due for removal 2026  Continue follow up with PCP as indicated.  Discussed importance of colonoscopy screening. Will consider for next year. IFOB  dispensed with instructions.  Discussed importance of mammogram and given phone to schedule.  Pap smear: no   counseled on breast self exam, mammography screening, feminine hygiene, adequate intake of calcium and vitamin D, diet and exercise  return annually or prn  An After Visit Summary was printed and given to the patient.

## 2019-03-31 ENCOUNTER — Other Ambulatory Visit: Payer: Self-pay

## 2019-04-05 ENCOUNTER — Other Ambulatory Visit: Payer: Self-pay

## 2019-04-05 ENCOUNTER — Ambulatory Visit: Payer: BC Managed Care – PPO | Admitting: Certified Nurse Midwife

## 2019-04-05 ENCOUNTER — Encounter: Payer: Self-pay | Admitting: Certified Nurse Midwife

## 2019-04-05 VITALS — BP 138/80 | HR 90 | Temp 97.2°F | Resp 16 | Ht 65.0 in | Wt 183.1 lb

## 2019-04-05 DIAGNOSIS — Z1211 Encounter for screening for malignant neoplasm of colon: Secondary | ICD-10-CM | POA: Diagnosis not present

## 2019-04-05 DIAGNOSIS — Z01419 Encounter for gynecological examination (general) (routine) without abnormal findings: Secondary | ICD-10-CM

## 2019-04-05 NOTE — Patient Instructions (Signed)

## 2019-04-26 ENCOUNTER — Telehealth: Payer: Self-pay | Admitting: Certified Nurse Midwife

## 2019-04-26 NOTE — Telephone Encounter (Signed)
Left message to call Wacey Zieger, RN at [facility] [phone].   

## 2019-04-26 NOTE — Telephone Encounter (Signed)
Patient sent the following message through [facility]. Routing to triage to assist patient with request.  Briya, [facility]  Phone Number: [phone]        That itch has returned with an bad smell its been about 3 days now .

## 2019-04-26 NOTE — Telephone Encounter (Signed)
Left message to call Tayden Duran, RN at [facility] [phone].   

## 2019-04-26 NOTE — Telephone Encounter (Signed)
Patient is returning a call to [name]. °

## 2019-04-28 NOTE — Telephone Encounter (Signed)
Spoke with patient. Patient reports vaginal itching and odor for 5 days. Has been using an OTC cream externally, is unsure of the name. Symptoms have not improved. Is unsure if she has vaginal d/c. Denies pain or bleeding. Covid 19 prescreen negative, precautions review. Advised OV needed for further evaluation, OV scheduled for 10/2 at 3:30pm with Melvia Heaps, CNM.   Routing to provider for final review. Patient is agreeable to disposition. Will close encounter.

## 2019-04-29 ENCOUNTER — Ambulatory Visit: Payer: BC Managed Care – PPO | Admitting: Certified Nurse Midwife

## 2019-07-22 LAB — FECAL OCCULT BLOOD, IMMUNOCHEMICAL: Fecal Occult Bld: NEGATIVE

## 2019-07-22 LAB — SPECIMEN STATUS REPORT

## 2019-07-26 LAB — HM DIABETES EYE EXAM

## 2019-08-02 ENCOUNTER — Encounter: Payer: Self-pay | Admitting: Family Medicine

## 2019-08-22 ENCOUNTER — Other Ambulatory Visit: Payer: Self-pay | Admitting: Family Medicine

## 2019-08-26 ENCOUNTER — Other Ambulatory Visit: Payer: Self-pay | Admitting: Family Medicine

## 2019-09-06 ENCOUNTER — Telehealth (INDEPENDENT_AMBULATORY_CARE_PROVIDER_SITE_OTHER): Payer: BC Managed Care – PPO | Admitting: Family Medicine

## 2019-09-06 ENCOUNTER — Encounter: Payer: Self-pay | Admitting: Family Medicine

## 2019-09-06 DIAGNOSIS — E119 Type 2 diabetes mellitus without complications: Secondary | ICD-10-CM | POA: Diagnosis not present

## 2019-09-06 DIAGNOSIS — Z794 Long term (current) use of insulin: Secondary | ICD-10-CM

## 2019-09-06 DIAGNOSIS — F418 Other specified anxiety disorders: Secondary | ICD-10-CM | POA: Diagnosis not present

## 2019-09-06 MED ORDER — CLONAZEPAM 1 MG PO TABS: 1.0000 mg | ORAL_TABLET | Freq: Two times a day (BID) | ORAL | 1 refills | Status: DC | PRN

## 2019-09-06 MED ORDER — ESCITALOPRAM OXALATE 10 MG PO TABS: 10.0000 mg | ORAL_TABLET | Freq: Every day | ORAL | 1 refills | Status: DC

## 2019-09-06 NOTE — Progress Notes (Signed)
[name] Hurley - 49 y.o. female MRN [id number]  Date of birth: [birth year]   This visit type was conducted due to national recommendations for restrictions regarding the COVID-19 Pandemic (e.g. social distancing).  This format is felt to be most appropriate for this patient at this time.  All issues noted in this document were discussed and addressed.  No physical exam was performed (except for noted visual exam findings with Video Visits).  I discussed the limitations of evaluation and management by telemedicine and the availability of in person appointments. The patient expressed understanding and agreed to proceed.  I connected with@ on 09/06/19 at 10:10 AM EST by a video enabled telemedicine application and verified that I am speaking with the correct person using two identifiers.  Present at visit: Everrett Coombe, DO [name]   Patient Location: Home  [address] [place] [address]   Provider location:   [facility]  Chief Complaint  Patient presents with  . Depression  . Anxiety    HPI  [name] Hurley is a 49 y.o. female who presents via audio/video conferencing for a telehealth visit today.  She reports increased anxiety with depression today.  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  She is frustrated by lack of transparency and enforcement of policies which she feels puts her at risk, especially given her medical conditions.  She has never had feelings like this before.  She is hesitant to add additional medications.  She is unaware of EAP counseling available through the school system.  She has been self medicating at home with increased EtOH, drinking 3-4 bottles of wine per week.     ROS:  A comprehensive ROS was completed and negative except as noted per HPI  Past Medical History:  Diagnosis Date  . Diabetes mellitus   . Dyspareunia   . Hypertension   . Sciatica     Past  Surgical History:  Procedure Laterality Date  . CESAREAN SECTION    . paragard     removal 7-16 & reinsertion 02-13-15    Family History  Problem Relation Age of Onset  . Hypertension Father   . COPD Father   . Emphysema Father   . Diabetes Maternal Aunt   . Hypertension Mother   . Hyperlipidemia Mother   . Colon cancer Mother   . COPD Paternal Grandfather   . Hypertension Brother   . Heart disease Neg Hx     Social History   Socioeconomic History  . Marital status: Married    Spouse name: Not on file  . Number of children: 4  . Years of education: 50  . Highest education level: Not on file  Occupational History  . Occupation: Nutritition   Tobacco Use  . Smoking status: Current Every Day Smoker    Packs/day: 1.00    Years: 22.00    Pack years: 22.00    Types: Cigarettes  . Smokeless tobacco: Never Used  Substance and Sexual Activity  . Alcohol use: Not Currently  . Drug use: No  . Sexual activity: Yes    Partners: Male    Birth control/protection: I.U.D.  Other Topics Concern  . Not on file  Social History Narrative   Born and raised in [place]. Fun: Sleep   Denies any religious beliefs effecting health care.    Social Determinants of Health   Financial Resource Strain:   . Difficulty of  Paying Living Expenses: Not on file  Food Insecurity:   . Worried About Programme researcher, broadcasting/film/video in the Last Year: Not on file  . Ran Out of Food in the Last Year: Not on file  Transportation Needs:   . Lack of Transportation (Medical): Not on file  . Lack of Transportation (Non-Medical): Not on file  Physical Activity:   . Days of Exercise per Week: Not on file  . Minutes of Exercise per Session: Not on file  Stress:   . Feeling of Stress : Not on file  Social Connections:   . Frequency of Communication with Friends and Family: Not on file  . Frequency of Social Gatherings with Friends and Family: Not on file  . Attends Religious Services: Not on file  . Active Member of  Clubs or Organizations: Not on file  . Attends Banker Meetings: Not on file  . Marital Status: Not on file  Intimate Partner Violence:   . Fear of Current or Ex-Partner: Not on file  . Emotionally Abused: Not on file  . Physically Abused: Not on file  . Sexually Abused: Not on file     Current Outpatient Medications:  .  empagliflozin (JARDIANCE) 10 MG TABS tablet, Take 10 mg by mouth daily., Disp: 90 tablet, Rfl: 1 .  Fluticasone Furoate-Vilanterol (BREO ELLIPTA) 100-25 MCG/INH AEPB, Inhale 1 puff into the lungs daily as needed., Disp: 28 each, Rfl: 0 .  glucose blood (ONE TOUCH ULTRA TEST) test strip, Use as instructed to check blood sugar 2 times per day dx code 250.02, Disp: 100 each, Rfl: 2 .  liraglutide (VICTOZA) 18 MG/3ML SOPN, INJECT 1.2MG  SUBCUTANEOUSLY DAILY AT THE SAME TIME EACH DAY, Disp: 18 mL, Rfl: 1 .  losartan (COZAAR) 25 MG tablet, Take 1 tablet (25 mg total) by mouth daily., Disp: 90 tablet, Rfl: 2 .  metFORMIN (GLUCOPHAGE) 1000 MG tablet, Take 1 tablet (1,000 mg total) by mouth 2 (two) times daily with a meal., Disp: 180 tablet, Rfl: 3 .  mupirocin ointment (BACTROBAN) 2 %, Apply 1 application topically 2 (two) times daily. To leg, Disp: 22 g, Rfl: 0 .  pravastatin (PRAVACHOL) 10 MG tablet, Take 1 tablet (10 mg total) by mouth daily., Disp: 90 tablet, Rfl: 2 .  TRESIBA FLEXTOUCH 100 UNIT/ML SOPN FlexTouch Pen, INJECT 20 UNITS SUBCUTANEOUSLY ONCE DAILY, Disp: 15 mL, Rfl: 0 .  clonazePAM (KLONOPIN) 1 MG tablet, Take 1 tablet (1 mg total) by mouth 2 (two) times daily as needed for anxiety., Disp: 20 tablet, Rfl: 1 .  escitalopram (LEXAPRO) 10 MG tablet, Take 1 tablet (10 mg total) by mouth daily. Take 5mg  x1 week then increase to 10mg  daily., Disp: 30 tablet, Rfl: 1  EXAM:  VITALS per patient if applicable: There were no vitals taken for this visit.  GENERAL: alert, oriented, appears well and in no acute distress  HEENT: atraumatic, conjunttiva clear, no  obvious abnormalities on inspection of external nose and ears  NECK: normal movements of the head and neck  LUNGS: on inspection no signs of respiratory distress, breathing rate appears normal, no obvious gross SOB, gasping or wheezing  CV: no obvious cyanosis  MS: moves all visible extremities without noticeable abnormality  PSYCH/NEURO: pleasant and cooperative, no obvious depression or anxiety, speech and thought processing grossly intact  ASSESSMENT AND PLAN:  Discussed the following assessment and plan:  Anxiety with depression Given information about [facility] EAP program offered through [facility] psych Discussed medication options, will start  lexapro 5mg  x1 week with increase to 10mg  thereafter.   Clonazepam 0.5-1mg  bid prn for severe anxiety.  Discussed cutting back on EtOH intake.  F/u in ~4-6 weeks.   Type 2 diabetes mellitus without complication, with long-term current use of insulin (HCC) Discussed she is overdue for diabetes follow up.  Will update labs at f/u visit.      >30 minutes spent including pre visit preparation, review of prior notes and labs, encounter with patient via video visit and same day documentation.  I discussed the assessment and treatment plan with the patient. The patient was provided an opportunity to ask questions and all were answered. The patient agreed with the plan and demonstrated an understanding of the instructions.   The patient was advised to call back or seek an in-person evaluation if the symptoms worsen or if the condition fails to improve as anticipated.    Luetta Nutting, DO

## 2019-09-06 NOTE — Assessment & Plan Note (Addendum)
Given information about [facility] EAP program offered through [facility] psych Discussed medication options, will start lexapro 5mg  x1 week with increase to 10mg  thereafter.   Clonazepam 0.5-1mg  bid prn for severe anxiety.  Discussed cutting back on EtOH intake.  F/u in ~4-6 weeks.

## 2019-09-06 NOTE — Assessment & Plan Note (Signed)
Discussed she is overdue for diabetes follow up.  Will update labs at f/u visit.

## 2019-09-14 ENCOUNTER — Encounter: Payer: Self-pay | Admitting: Family Medicine

## 2019-09-25 ENCOUNTER — Other Ambulatory Visit: Payer: Self-pay | Admitting: Family Medicine

## 2019-09-25 DIAGNOSIS — Z794 Long term (current) use of insulin: Secondary | ICD-10-CM

## 2019-09-25 DIAGNOSIS — E119 Type 2 diabetes mellitus without complications: Secondary | ICD-10-CM

## 2019-10-04 ENCOUNTER — Encounter: Payer: Self-pay | Admitting: Family Medicine

## 2019-10-11 ENCOUNTER — Other Ambulatory Visit: Payer: Self-pay | Admitting: Family Medicine

## 2019-10-11 ENCOUNTER — Encounter: Payer: Self-pay | Admitting: Family Medicine

## 2019-10-11 NOTE — Telephone Encounter (Signed)
2.12.21 #180 with 3 refills was sent to pharmacy/thx dmf

## 2019-10-13 ENCOUNTER — Other Ambulatory Visit: Payer: Self-pay | Admitting: Family Medicine

## 2019-10-13 ENCOUNTER — Ambulatory Visit
Admission: RE | Admit: 2019-10-13 | Discharge: 2019-10-13 | Disposition: A | Discharge status: Home / Self Care | Payer: BC Managed Care – PPO | Source: Ambulatory Visit

## 2019-10-13 ENCOUNTER — Other Ambulatory Visit: Payer: Self-pay

## 2019-10-13 ENCOUNTER — Other Ambulatory Visit: Payer: Self-pay | Admitting: *Deleted

## 2019-10-13 DIAGNOSIS — Z1231 Encounter for screening mammogram for malignant neoplasm of breast: Secondary | ICD-10-CM

## 2019-10-13 NOTE — Telephone Encounter (Signed)
Dr. Ashley Royalty please advise,  Pharmacy sent request to this location Pt follow you to your new location and had visit with you on 09/06/2019.

## 2019-10-17 ENCOUNTER — Encounter: Payer: Self-pay | Admitting: Certified Nurse Midwife

## 2019-10-26 ENCOUNTER — Telehealth: Payer: Self-pay | Admitting: Family Medicine

## 2019-10-26 NOTE — Telephone Encounter (Signed)
I received a fax from patient short term disability and PCP filled out form and it was faxed to [employer].  I received a message from [place] at [employer] and she reports she needed a date patient was advised to stop working and per PCP the patient was not advised to stop working. I called and spoke with Zollie Scale and she took the information and will pass this along to the case manager.

## 2019-10-27 ENCOUNTER — Encounter: Payer: Self-pay | Admitting: Family Medicine

## 2019-10-27 ENCOUNTER — Ambulatory Visit (INDEPENDENT_AMBULATORY_CARE_PROVIDER_SITE_OTHER): Payer: BC Managed Care – PPO | Admitting: Family Medicine

## 2019-10-27 ENCOUNTER — Other Ambulatory Visit: Payer: Self-pay

## 2019-10-27 VITALS — BP 136/82 | HR 87 | Ht 64.0 in | Wt 174.0 lb

## 2019-10-27 DIAGNOSIS — E119 Type 2 diabetes mellitus without complications: Secondary | ICD-10-CM

## 2019-10-27 DIAGNOSIS — I10 Essential (primary) hypertension: Secondary | ICD-10-CM

## 2019-10-27 DIAGNOSIS — E785 Hyperlipidemia, unspecified: Secondary | ICD-10-CM | POA: Diagnosis not present

## 2019-10-27 DIAGNOSIS — F418 Other specified anxiety disorders: Secondary | ICD-10-CM

## 2019-10-27 DIAGNOSIS — Z794 Long term (current) use of insulin: Secondary | ICD-10-CM

## 2019-10-27 DIAGNOSIS — F17210 Nicotine dependence, cigarettes, uncomplicated: Secondary | ICD-10-CM

## 2019-10-27 LAB — POCT GLYCOSYLATED HEMOGLOBIN (HGB A1C): Hemoglobin A1C: 7.2 % — AB (ref 4.0–5.6)

## 2019-10-27 MED ORDER — ESCITALOPRAM OXALATE 10 MG PO TABS: 10.0000 mg | ORAL_TABLET | Freq: Every day | ORAL | 1 refills | Status: DC

## 2019-10-27 MED ORDER — METFORMIN HCL 1000 MG PO TABS: ORAL_TABLET | ORAL | 2 refills | Status: DC

## 2019-10-27 MED ORDER — BUSPIRONE HCL 7.5 MG PO TABS: 7.5000 mg | ORAL_TABLET | Freq: Two times a day (BID) | ORAL | 2 refills | Status: DC | PRN

## 2019-10-27 MED ORDER — TRESIBA FLEXTOUCH 100 UNIT/ML ~~LOC~~ SOPN: PEN_INJECTOR | SUBCUTANEOUS | 0 refills | Status: DC

## 2019-10-27 MED ORDER — PRAVASTATIN SODIUM 10 MG PO TABS: 10.0000 mg | ORAL_TABLET | Freq: Every day | ORAL | 2 refills | Status: DC

## 2019-10-27 MED ORDER — VICTOZA 18 MG/3ML ~~LOC~~ SOPN: PEN_INJECTOR | SUBCUTANEOUS | 1 refills | Status: DC

## 2019-10-27 MED ORDER — LOSARTAN POTASSIUM 25 MG PO TABS: 25.0000 mg | ORAL_TABLET | Freq: Every day | ORAL | 2 refills | Status: DC

## 2019-10-27 MED ORDER — JARDIANCE 10 MG PO TABS: 10.0000 mg | ORAL_TABLET | Freq: Every day | ORAL | 1 refills | Status: DC

## 2019-10-27 NOTE — Progress Notes (Signed)
[name] Hurley - 49 y.o. female MRN [id number]  Date of birth: 1970-12-26  Subjective Chief Complaint  Patient presents with  . Diabetes    HPI [name] Hurley is a 49 y.o. female with history of hypertension, type 2 diabetes, hyperlipidemia, nicotine dependence and anxiety here today for follow-up visit.  -Hypertension: History of hypertension currently managed with losartan 25 mg daily.  She is doing well with current dose of medication.  She does not monitor her blood pressure at home regularly.  She denies any symptoms related to hypertension including chest pain, shortness of breath, palpitations, headaches or vision changes.  -Type 2 diabetes: She has been struggling with some depression and anxiety and was not compliant with medications or diet.  Over the past couple months she has started using her medications more consistently as well as following a healthier diet.  She reports that she feels better in this regard.  Current treatment includes Tresiba 20 units daily, Metformin 1000 mg twice daily, Jardiance 10 mg daily, Victoza 1.2 mg daily.  She denies any symptoms of hypoglycemia with current medications.  Her weight is down approximately 10 pounds since her last visit with me in the fall.  -Hyperlipidemia: She is currently taking pravastatin for management of hyperlipidemia.  She is tolerating this well without myalgias.  -Depression/anxiety: She had significant depression and anxiety since beginning of pandemic due to her comorbidities.  Going to work and being around unfamiliar people and places has made her extremely anxious.  She was started on Lexapro previously and is tolerating well.  She does find that this has been helpful.  She also has a prescription for clonazepam and use this 3-4 times for severe anxiety.  She feels that she is unable to use this while she is at work due to its sedating nature.  She is also seeing a counselor and has found this helpful.  She recently received  her first dose of the Covid-19 vaccine.  ROS:  A comprehensive ROS was completed and negative except as noted per HPI  No Known Allergies  Past Medical History:  Diagnosis Date  . Diabetes mellitus   . Dyspareunia   . Hypertension   . Sciatica     Past Surgical History:  Procedure Laterality Date  . CESAREAN SECTION    . paragard     removal 7-16 & reinsertion 02-13-15    Social History   Socioeconomic History  . Marital status: Married    Spouse name: Not on file  . Number of children: 4  . Years of education: 56  . Highest education level: Not on file  Occupational History  . Occupation: Nutritition   Tobacco Use  . Smoking status: Current Every Day Smoker    Packs/day: 1.00    Years: 22.00    Pack years: 22.00    Types: Cigarettes  . Smokeless tobacco: Never Used  Substance and Sexual Activity  . Alcohol use: Not Currently  . Drug use: No  . Sexual activity: Yes    Partners: Male    Birth control/protection: I.U.D.  Other Topics Concern  . Not on file  Social History Narrative   Born and raised in [place]. Fun: Sleep   Denies any religious beliefs effecting health care.    Social Determinants of Health   Financial Resource Strain:   . Difficulty of Paying Living Expenses:   Food Insecurity:   . Worried About Charity fundraiser in the Last Year:   . [employer] of  Food in the Last Year:   Transportation Needs:   . Freight forwarder (Medical):   Marland Kitchen Lack of Transportation (Non-Medical):   Physical Activity:   . Days of Exercise per Week:   . Minutes of Exercise per Session:   Stress:   . Feeling of Stress :   Social Connections:   . Frequency of Communication with Friends and Family:   . Frequency of Social Gatherings with Friends and Family:   . Attends Religious Services:   . Active Member of Clubs or Organizations:   . Attends Banker Meetings:   Marland Kitchen Marital Status:     Family History  Problem Relation Age of Onset  . Hypertension  Father   . COPD Father   . Emphysema Father   . Diabetes Maternal Aunt   . Hypertension Mother   . Hyperlipidemia Mother   . Colon cancer Mother   . COPD Paternal Grandfather   . Hypertension Brother   . Heart disease Neg Hx     Health Maintenance  Topic Date Due  . FOOT EXAM  11/20/2018  . COLONOSCOPY  Never done  . TETANUS/TDAP  10/26/2020 (Originally 01/05/1988)  . HIV Screening  10/26/2020 (Originally 01/05/1984)  . PAP SMEAR-Modifier  02/11/2020  . INFLUENZA VACCINE  02/26/2020  . HEMOGLOBIN A1C  04/27/2020  . OPHTHALMOLOGY EXAM  07/25/2020  . MAMMOGRAM  10/12/2021  . PNEUMOCOCCAL POLYSACCHARIDE VACCINE AGE 49-64 HIGH RISK  Completed     ----------------------------------------------------------------------------------------------------------------------------------------------------------------------------------------------------------------- Physical Exam BP 136/82   Pulse 87   Ht 5\' 4"  (1.626 m)   Wt 174 lb (78.9 kg)   BMI 29.87 kg/m   Physical Exam Constitutional:      Appearance: Normal appearance.  HENT:     Head: Normocephalic and atraumatic.     Mouth/Throat:     Mouth: Mucous membranes are moist.  Eyes:     General: No scleral icterus. Cardiovascular:     Rate and Rhythm: Normal rate and regular rhythm.  Pulmonary:     Effort: Pulmonary effort is normal.     Breath sounds: Normal breath sounds.  Musculoskeletal:     Cervical back: Neck supple.  Neurological:     General: No focal deficit present.     Mental Status: She is alert.  Psychiatric:     Comments: She is fairly anxious today.  She maintains distance from me across the room during the first part of the encounter.     ------------------------------------------------------------------------------------------------------------------------------------------------------------------------------------------------------------------- Assessment and Plan  Essential hypertension Her hypertension  is well controlled at this time.  She will continue losartan at current strength and dosing.  Refill provided we will update lab work.  Type 2 diabetes mellitus without complication, with long-term current use of insulin (HCC) Diabetes control has improved significantly.  I congratulated her on her improvement as well as her weight loss.  We will continue current medications.  Encouraged healthy diet and regular exercise for both her anxiety and management of her chronic diseases.  Cigarette nicotine dependence without complication Counseled on smoking cessation.  She does not feel she is ready to quit at this time.  Dyslipidemia She is tolerating pravastatin well.  Update lipid panel today.  Anxiety with depression Anxiety has improved some however still an issue for her.  She will continue talk therapy as well as Lexapro at current dose.  We will add BuSpar to use during the day for anxiety.  She may continue clonazepam in the evenings if needed.   Meds  ordered this encounter  Medications  . empagliflozin (JARDIANCE) 10 MG TABS tablet    Sig: Take 10 mg by mouth daily.    Dispense:  90 tablet    Refill:  1  . liraglutide (VICTOZA) 18 MG/3ML SOPN    Sig: INJECT 1.2MG  SUBCUTANEOUSLY DAILY AT THE SAME TIME EACH DAY    Dispense:  18 mL    Refill:  1    PLEASE DISPENSE 6 PENS  . losartan (COZAAR) 25 MG tablet    Sig: Take 1 tablet (25 mg total) by mouth daily.    Dispense:  90 tablet    Refill:  2  . metFORMIN (GLUCOPHAGE) 1000 MG tablet    Sig: TAKE 1 TABLET BY MOUTH TWICE DAILY WITH A MEAL    Dispense:  180 tablet    Refill:  2  . insulin degludec (TRESIBA FLEXTOUCH) 100 UNIT/ML FlexTouch Pen    Sig: INJECT 20 UNITS SUBCUTANEOUSLY ONCE DAILY    Dispense:  15 mL    Refill:  0  . pravastatin (PRAVACHOL) 10 MG tablet    Sig: Take 1 tablet (10 mg total) by mouth daily.    Dispense:  90 tablet    Refill:  2  . busPIRone (BUSPAR) 7.5 MG tablet    Sig: Take 1 tablet (7.5 mg total)  by mouth 2 (two) times daily as needed.    Dispense:  60 tablet    Refill:  2  . escitalopram (LEXAPRO) 10 MG tablet    Sig: Take 1 tablet (10 mg total) by mouth daily. Take 5mg  x1 week then increase to 10mg  daily.    Dispense:  90 tablet    Refill:  1    No follow-ups on file.    This visit occurred during the SARS-CoV-2 public health emergency.  Safety protocols were in place, including screening questions prior to the visit, additional usage of staff PPE, and extensive cleaning of exam room while observing appropriate contact time as indicated for disinfecting solutions.

## 2019-10-27 NOTE — Assessment & Plan Note (Signed)
She is tolerating pravastatin well.  Update lipid panel today.

## 2019-10-27 NOTE — Patient Instructions (Addendum)
Great to see you today! Lets add buspar during the day for anxiety.  Continue lexapro and clonazepam if needed.  Continue current medications for diabetes and continue working on weight loss.  See me again in about 3 months.

## 2019-10-27 NOTE — Assessment & Plan Note (Signed)
Her hypertension is well controlled at this time.  She will continue losartan at current strength and dosing.  Refill provided we will update lab work.

## 2019-10-27 NOTE — Assessment & Plan Note (Signed)
Diabetes control has improved significantly.  I congratulated her on her improvement as well as her weight loss.  We will continue current medications.  Encouraged healthy diet and regular exercise for both her anxiety and management of her chronic diseases.

## 2019-10-27 NOTE — Assessment & Plan Note (Signed)
Anxiety has improved some however still an issue for her.  She will continue talk therapy as well as Lexapro at current dose.  We will add BuSpar to use during the day for anxiety.  She may continue clonazepam in the evenings if needed.

## 2019-10-27 NOTE — Assessment & Plan Note (Signed)
Counseled on smoking cessation.  She does not feel she is ready to quit at this time.

## 2019-10-28 ENCOUNTER — Encounter: Payer: Self-pay | Admitting: Family Medicine

## 2019-10-28 LAB — CBC
HCT: 48.2 % — ABNORMAL HIGH (ref 35.0–45.0)
Hemoglobin: 15.5 g/dL (ref 11.7–15.5)
MCH: 27.4 pg (ref 27.0–33.0)
MCHC: 32.2 g/dL (ref 32.0–36.0)
MCV: 85.3 fL (ref 80.0–100.0)
MPV: 9.8 fL (ref 7.5–12.5)
Platelets: 253 10*3/uL (ref 140–400)
RBC: 5.65 10*6/uL — ABNORMAL HIGH (ref 3.80–5.10)
RDW: 14 % (ref 11.0–15.0)
WBC: 9.8 10*3/uL (ref 3.8–10.8)

## 2019-10-28 LAB — COMPLETE METABOLIC PANEL WITH GFR
AG Ratio: 1.6 (calc) (ref 1.0–2.5)
ALT: 8 U/L (ref 6–29)
AST: 12 U/L (ref 10–35)
Albumin: 4.3 g/dL (ref 3.6–5.1)
Alkaline phosphatase (APISO): 76 U/L (ref 37–153)
BUN/Creatinine Ratio: 15 (calc) (ref 6–22)
BUN: 17 mg/dL (ref 7–25)
CO2: 24 mmol/L (ref 20–32)
Calcium: 9.7 mg/dL (ref 8.6–10.4)
Chloride: 103 mmol/L (ref 98–110)
Creat: 1.17 mg/dL — ABNORMAL HIGH (ref 0.50–1.05)
GFR, Est African American: 63 mL/min/{1.73_m2} (ref >=60)
GFR, Est Non African American: 54 mL/min/{1.73_m2} — ABNORMAL LOW (ref >=60)
Globulin: 2.7 g/dL (calc) (ref 1.9–3.7)
Glucose, Bld: 122 mg/dL — ABNORMAL HIGH (ref 65–99)
Potassium: 4.6 mmol/L (ref 3.5–5.3)
Sodium: 139 mmol/L (ref 135–146)
Total Bilirubin: 0.4 mg/dL (ref 0.2–1.2)
Total Protein: 7 g/dL (ref 6.1–8.1)

## 2019-10-28 LAB — LIPID PANEL
Cholesterol: 157 mg/dL (ref <200)
HDL: 29 mg/dL — ABNORMAL LOW (ref >=50)
LDL Cholesterol (Calc): 98 mg/dL (calc)
Non-HDL Cholesterol (Calc): 128 mg/dL (calc) (ref <130)
Total CHOL/HDL Ratio: 5.4 (calc) — ABNORMAL HIGH (ref <5.0)
Triglycerides: 205 mg/dL — ABNORMAL HIGH (ref <150)

## 2019-10-31 NOTE — Telephone Encounter (Signed)
Last appt note states: "Lets add buspar during the day for anxiety.  Continue lexapro and clonazepam if needed. "  I am under the impression patient is to stay on Lexapro and Buspar daily, and Clonazepam is PRN.   Please advise? Note to covering provider.

## 2019-12-16 ENCOUNTER — Encounter: Payer: Self-pay | Admitting: Family Medicine

## 2019-12-23 ENCOUNTER — Encounter: Payer: Self-pay | Admitting: Family Medicine

## 2020-01-11 ENCOUNTER — Telehealth: Payer: Self-pay | Admitting: Obstetrics and Gynecology

## 2020-01-11 NOTE — Telephone Encounter (Signed)
Patient is having a "reourring itch" and would like an appointment. Marland Kitchen

## 2020-01-11 NOTE — Telephone Encounter (Signed)
AEX 12/2018 with DL, next scheduled for 2/95/28 with JJ Recurrent yeast per pt, last + yeast 12/2018 Chronic vaginal yeast with Diflucan use  Spoke with pt. Pt states having recurrent vaginal external itching and pain on external skin when urinating. Pt denies vaginal odor, discharge, all UTI sx, fever, chills or vaginal bleeding. Pt states has been using Clotrimazole OTC and has helped some,but not for long term. Pt also used oatmeal bath and only resolves while in the tub. Pt has diabetes and states under control at this time. Pt has Mirena IUD.  Pt advised to have OV with Dr Oscar La for further evaluation. Declines seeing PCP or urgent care or another provider. Pt agreeable. Pt states will use OTC monistat 3 starting tonight. Pt scheduled for OV with Dr Oscar La as a work-in appt on 6/21 at 8:15 am. Pt verbalized understanding of date and time of appt. CPS neg.   Routing to Dr Oscar La for review. Encounter closed.

## 2020-01-13 ENCOUNTER — Other Ambulatory Visit: Payer: Self-pay

## 2020-01-16 ENCOUNTER — Other Ambulatory Visit: Payer: Self-pay

## 2020-01-16 ENCOUNTER — Ambulatory Visit: Payer: BC Managed Care – PPO | Admitting: Obstetrics and Gynecology

## 2020-01-16 ENCOUNTER — Encounter: Payer: Self-pay | Admitting: Obstetrics and Gynecology

## 2020-01-16 VITALS — BP 128/70 | HR 75 | Temp 97.5°F | Ht 64.0 in | Wt 181.2 lb

## 2020-01-16 DIAGNOSIS — N766 Ulceration of vulva: Secondary | ICD-10-CM

## 2020-01-16 DIAGNOSIS — N762 Acute vulvitis: Secondary | ICD-10-CM | POA: Diagnosis not present

## 2020-01-16 MED ORDER — LIDOCAINE 5 % EX OINT: 1.0000 "application " | TOPICAL_OINTMENT | Freq: Four times a day (QID) | CUTANEOUS | 0 refills | Status: DC | PRN

## 2020-01-16 MED ORDER — VALACYCLOVIR HCL 1 G PO TABS: 1000.0000 mg | ORAL_TABLET | Freq: Two times a day (BID) | ORAL | 0 refills | Status: DC

## 2020-01-16 NOTE — Progress Notes (Addendum)
GYNECOLOGY  VISIT   HPI: 49 y.o.   Married Black or [place] American Not Hispanic or Latino  female   G1P1001 with Patient's last menstrual period was 08/29/2019 (approximate).   here for vaginal irritation.  Symptoms have been intermittent for a month. Unbearable in the last 2 weeks. Itching and burning is very bad, hurts to void, hurts to sit. No d/c or odor. She has been using an over the counter clotrimazole, doing sitz baths.  No h/o oral or genital HSV, husband without h/o oral or genital HSV. Married for 22 years, no concerns about STD's.  H/O diabetes, last HgbA1C from 10/27/19 was 7.2 (down from 10 in 7/20).   GYNECOLOGIC HISTORY: Patient's last menstrual period was 08/29/2019 (approximate). Contraception:IUD Menopausal hormone therapy: none        OB History    Gravida  1   Para  1   Term  1   Preterm      AB      Living  1     SAB      TAB      Ectopic      Multiple      Live Births  1        Obstetric Comments  Also 3 adopted children (2 boys and 1 girl)           Patient Active Problem List   Diagnosis Date Noted  . Anxiety with depression 09/06/2019  . Open wound of toe 02/09/2019  . Bursitis of left hip 09/29/2018  . Cigarette nicotine dependence without complication 07/30/7251  . Shoulder impingement syndrome, right 11/19/2017  . Essential hypertension 11/19/2017  . Dyslipidemia 09/19/2015  . Muscle cramp 10/17/2014  . Type 2 diabetes mellitus without complication, with long-term current use of insulin ([facility]) 11/24/2013    Past Medical History:  Diagnosis Date  . Diabetes mellitus   . Dyspareunia   . Hypertension   . Sciatica     Past Surgical History:  Procedure Laterality Date  . CESAREAN SECTION    . paragard     removal 7-16 & reinsertion 02-13-15    Current Outpatient Medications  Medication Sig Dispense Refill  . busPIRone (BUSPAR) 7.5 MG tablet Take 1 tablet (7.5 mg total) by mouth 2 (two) times daily as needed. 60 tablet  2  . clonazePAM (KLONOPIN) 1 MG tablet Take 1 tablet (1 mg total) by mouth 2 (two) times daily as needed for anxiety. 20 tablet 1  . empagliflozin (JARDIANCE) 10 MG TABS tablet Take 10 mg by mouth daily. 90 tablet 1  . escitalopram (LEXAPRO) 10 MG tablet Take 1 tablet (10 mg total) by mouth daily. Take 5mg  x1 week then increase to 10mg  daily. 90 tablet 1  . Fluticasone Furoate-Vilanterol (BREO ELLIPTA) 100-25 MCG/INH AEPB Inhale 1 puff into the lungs daily as needed. 28 each 0  . insulin degludec (TRESIBA FLEXTOUCH) 100 UNIT/ML FlexTouch Pen INJECT 20 UNITS SUBCUTANEOUSLY ONCE DAILY 15 mL 0  . liraglutide (VICTOZA) 18 MG/3ML SOPN INJECT 1.2MG  SUBCUTANEOUSLY DAILY AT THE SAME TIME EACH DAY 18 mL 1  . losartan (COZAAR) 25 MG tablet Take 1 tablet (25 mg total) by mouth daily. 90 tablet 2  . metFORMIN (GLUCOPHAGE) 1000 MG tablet TAKE 1 TABLET BY MOUTH TWICE DAILY WITH A MEAL 180 tablet 2  . mupirocin ointment (BACTROBAN) 2 % Apply 1 application topically 2 (two) times daily. To leg 22 g 0  . ONETOUCH ULTRA test strip USE AS DIRECTED TWICE DAILY  TO CHECK BLOOD SUGAR 100 each 0  . pravastatin (PRAVACHOL) 10 MG tablet Take 1 tablet (10 mg total) by mouth daily. 90 tablet 2   No current facility-administered medications for this visit.     ALLERGIES: Patient has no known allergies.  Family History  Problem Relation Age of Onset  . Hypertension Father   . COPD Father   . Emphysema Father   . Diabetes Maternal Aunt   . Hypertension Mother   . Hyperlipidemia Mother   . Colon cancer Mother   . COPD Paternal Grandfather   . Hypertension Brother   . Heart disease Neg Hx     Social History   Socioeconomic History  . Marital status: Married    Spouse name: Not on file  . Number of children: 4  . Years of education: 33  . Highest education level: Not on file  Occupational History  . Occupation: Nutritition   Tobacco Use  . Smoking status: Current Every Day Smoker    Packs/day: 1.00     Years: 22.00    Pack years: 22.00    Types: Cigarettes  . Smokeless tobacco: Never Used  Substance and Sexual Activity  . Alcohol use: Not Currently  . Drug use: No  . Sexual activity: Yes    Partners: Male    Birth control/protection: I.U.D.  Other Topics Concern  . Not on file  Social History Narrative   Born and raised in [place]. Fun: Sleep   Denies any religious beliefs effecting health care.    Social Determinants of Health   Financial Resource Strain:   . Difficulty of Paying Living Expenses:   Food Insecurity:   . Worried About Programme researcher, broadcasting/film/video in the Last Year:   . Barista in the Last Year:   Transportation Needs:   . Freight forwarder (Medical):   Marland Kitchen Lack of Transportation (Non-Medical):   Physical Activity:   . Days of Exercise per Week:   . Minutes of Exercise per Session:   Stress:   . Feeling of Stress :   Social Connections:   . Frequency of Communication with Friends and Family:   . Frequency of Social Gatherings with Friends and Family:   . Attends Religious Services:   . Active Member of Clubs or Organizations:   . Attends Banker Meetings:   Marland Kitchen Marital Status:   Intimate Partner Violence:   . Fear of Current or Ex-Partner:   . Emotionally Abused:   Marland Kitchen Physically Abused:   . Sexually Abused:     Review of Systems  All other systems reviewed and are negative.   PHYSICAL EXAMINATION:    BP 128/70   Pulse 75   Temp (!) 97.5 F (36.4 C)   Ht 5\' 4"  (1.626 m)   Wt 181 lb 3.2 oz (82.2 kg)   LMP 08/29/2019 (Approximate)   SpO2 98%   BMI 31.10 kg/m     General appearance: alert, cooperative and appears stated age  Pelvic: External genitalia:  Multiple ulcers on the inner right vulva, mild erythema.               Urethra:  normal appearing urethra with no masses, tenderness or lesions              Bartholins and Skenes: normal                 Vagina: normal appearing vagina with normal color and discharge, no lesions  Cervix: no lesions               Chaperone was present for exam.  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  28 minutes in total patient care.   Addendum: labs were reviewed. Last creatinine was 1.17, GRF African American was 63.

## 2020-01-16 NOTE — Patient Instructions (Signed)

## 2020-01-18 LAB — HSV NAA
HSV 1 NAA: NEGATIVE
HSV 2 NAA: POSITIVE — AB

## 2020-01-18 LAB — NUSWAB VAGINITIS (VG)
Candida albicans, NAA: NEGATIVE
Candida glabrata, NAA: NEGATIVE
Trich vag by NAA: NEGATIVE

## 2020-01-19 ENCOUNTER — Telehealth: Payer: Self-pay

## 2020-01-19 NOTE — Telephone Encounter (Signed)
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 She is on Valtrex and has lidocaine. See if she wants to set up a time to f/u and discuss further.

## 2020-01-19 NOTE — Telephone Encounter (Signed)
Spoke with pt. Pt given results and recommendations per Dr Oscar La. Pt agreeable to have further discussion on new diagnosis. Pt requesting Mychart visit.  Pt advised ok to have Mychart visit unless having worsening outbreak symptoms. Pt verbalized understanding. Pt advised will return call with any additional recommendations.   Pt scheduled as Mychart visit for 01/31/20 at 1130 am. Pt agreeable to date and time. Pt thankful for call.   Routing to Dr Oscar La for review.  Encounter closed.

## 2020-01-20 ENCOUNTER — Other Ambulatory Visit: Payer: Self-pay | Admitting: Family Medicine

## 2020-01-31 ENCOUNTER — Telehealth: Payer: BC Managed Care – PPO | Admitting: Obstetrics and Gynecology

## 2020-02-03 ENCOUNTER — Encounter: Payer: Self-pay | Admitting: Obstetrics and Gynecology

## 2020-02-03 ENCOUNTER — Telehealth (INDEPENDENT_AMBULATORY_CARE_PROVIDER_SITE_OTHER): Payer: BC Managed Care – PPO | Admitting: Obstetrics and Gynecology

## 2020-02-03 DIAGNOSIS — A6000 Herpesviral infection of urogenital system, unspecified: Secondary | ICD-10-CM

## 2020-02-03 MED ORDER — VALACYCLOVIR HCL 500 MG PO TABS: ORAL_TABLET | ORAL | 1 refills | Status: DC

## 2020-02-03 MED ORDER — BETAMETHASONE VALERATE 0.1 % EX OINT: 1.0000 "application " | TOPICAL_OINTMENT | Freq: Two times a day (BID) | CUTANEOUS | 0 refills | Status: DC

## 2020-02-03 NOTE — Patient Instructions (Signed)
Your husband should have his blood drawn for HSV 2 serology    Genital Herpes Genital herpes is a common sexually transmitted infection (STI) that is caused by a virus. The virus spreads from person to person through sexual contact. Infection can cause itching, blisters, and sores around the genitals or rectum. Symptoms may last several days and then go away This is called an outbreak. However, the virus remains in your body, so you may have more outbreaks in the future. The time between outbreaks varies and can be months or years. Genital herpes affects men and women. It is particularly concerning for pregnant women because the virus can be passed to the baby during delivery and can cause serious problems. Genital herpes is also a concern for people who have a weak disease-fighting (immune) system. What are the causes? This condition is caused by the herpes simplex virus (HSV) type 1 or type 2. The virus may spread through:  Sexual contact with an infected person, including vaginal, anal, and oral sex.  Contact with fluid from a herpes sore.  The skin. This means that you can get herpes from an infected partner even if he or she does not have a visible sore or does not know that he or she is infected. What increases the risk? You are more likely to develop this condition if:  You have sex with many partners.  You do not use latex condoms during sex. What are the signs or symptoms? Most people do not have symptoms (asymptomatic) or have mild symptoms that may be mistaken for other skin problems. Symptoms may include:  Small red bumps near the genitals, rectum, or mouth. These bumps turn into blisters and then turn into sores.  Flu-like symptoms, including: ? Fever. ? Body aches. ? Swollen lymph nodes. ? Headache.  Painful urination.  Pain and itching in the genital area or rectal area.  Vaginal discharge.  Tingling or shooting pain in the legs and buttocks. Generally, symptoms  are more severe and last longer during the first (primary) outbreak. Flu-like symptoms are also more common during the primary outbreak. How is this diagnosed? Genital herpes may be diagnosed based on:  A physical exam.  Your medical history.  Blood tests.  A test of a fluid sample (culture) from an open sore. How is this treated? There is no cure for this condition, but treatment with antiviral medicines that are taken by mouth (orally) can do the following:  Speed up healing and relieve symptoms.  Help to reduce the spread of the virus to sexual partners.  Limit the chance of future outbreaks, or make future outbreaks shorter.  Lessen symptoms of future outbreaks. Your health care provider may also recommend pain relief medicines, such as aspirin or ibuprofen. Follow these instructions at home: Sexual activity  Do not have sexual contact during active outbreaks.  Practice safe sex. Latex condoms and female condoms may help prevent the spread of the herpes virus. General instructions  Keep the affected areas dry and clean.  Take over-the-counter and prescription medicines only as told by your health care provider.  Avoid rubbing or touching blisters and sores. If you do touch blisters or sores: ? Wash your hands thoroughly with soap and water. ? Do not touch your eyes afterward.  To help relieve pain or itching, you may take the following actions as directed by your health care provider: ? Apply a cold, wet cloth (cold compress) to affected areas 4-6 times a day. ? Apply a  substance that protects your skin and reduces bleeding (astringent). ? Apply a gel that helps relieve pain around sores (lidocaine gel). ? Take a warm, shallow bath that cleans the genital area (sitz bath).  Keep all follow-up visits as told by your health care provider. This is important. How is this prevented?  Use condoms. Although anyone can get genital herpes during sexual contact, even with the  use of a condom, a condom can provide some protection.  Avoid having multiple sexual partners.  Talk with your sexual partner about any symptoms either of you may have. Also, talk with your partner about any history of STIs.  Get tested for STIs before you have sex. Ask your partner to do the same.  Do not have sexual contact if you have symptoms of genital herpes. Contact a health care provider if:  Your symptoms are not improving with medicine.  Your symptoms return.  You have new symptoms.  You have a fever.  You have abdominal pain.  You have redness, swelling, or pain in your eye.  You notice new sores on other parts of your body.  You are a woman and experience bleeding between menstrual periods.  You have had herpes and you become pregnant or plan to become pregnant. Summary  Genital herpes is a common sexually transmitted infection (STI) that is caused by the herpes simplex virus (HSV) type 1 or type 2.  These viruses are most often spread through sexual contact with an infected person.  You are more likely to develop this condition if you have sex with many partners or you have unprotected sex.  Most people do not have symptoms (asymptomatic) or have mild symptoms that may be mistaken for other skin problems. Symptoms occur as outbreaks that may happen months or years apart.  There is no cure for this condition, but treatment with oral antiviral medicines can reduce symptoms, reduce the chance of spreading the virus to a partner, prevent future outbreaks, or shorten future outbreaks. This information is not intended to replace advice given to you by your health care provider. Make sure you discuss any questions you have with your health care provider. Document Revised: 01/18/2018 Document Reviewed: 06/13/2016 Elsevier Patient Education  2020 [employer].

## 2020-02-03 NOTE — Progress Notes (Signed)
Virtual Visit via Video Note  I connected with [name] Hurley on 02/03/20 at  4:30 PM EDT by a video enabled telemedicine application and verified that I am speaking with the correct person using two identifiers.  Location: Patient: home Provider: office at [facility]   I discussed the limitations of evaluation and management by telemedicine and the availability of in person appointments. The patient expressed understanding and agreed to proceed.  GYNECOLOGY  VISIT   HPI: 49 y.o.   Married Black or [place] American Not Hispanic or Latino  female   G1P1001 with No LMP recorded. (Menstrual status: IUD).   here for a virtual visit to discuss recent diagnosis of HSV. 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 Neither she or her husband have a prior h/o oral or genital HSV. Married x 22 years.    She feels better, no more sores, doesn't hurt to void. She is feeling very itchy just focally at the top of her vulva.   She questions if she has had an outbreak previously, has occasionally been sore and or itchy.   GYNECOLOGIC HISTORY: No LMP recorded. (Menstrual status: IUD). Contraception: IUD Menopausal hormone therapy: none        OB History    Gravida  1   Para  1   Term  1   Preterm      AB      Living  1     SAB      TAB      Ectopic      Multiple      Live Births  1        Obstetric Comments  Also 3 adopted children (2 boys and 1 girl)           Patient Active Problem List   Diagnosis Date Noted   Anxiety with depression 09/06/2019   Open wound of toe 02/09/2019   Bursitis of left hip 09/29/2018   Cigarette nicotine dependence without complication 11/19/2017   Shoulder impingement syndrome, right 11/19/2017   Essential hypertension 11/19/2017   Dyslipidemia 09/19/2015   Muscle cramp 10/17/2014   Type 2 diabetes mellitus without complication, with long-term current use of insulin (HCC)  11/24/2013    Past Medical History:  Diagnosis Date   Diabetes mellitus    Dyspareunia    Hypertension    Sciatica     Past Surgical History:  Procedure Laterality Date   CESAREAN SECTION     paragard     removal 7-16 & reinsertion 02-13-15    Current Outpatient Medications  Medication Sig Dispense Refill   busPIRone (BUSPAR) 7.5 MG tablet Take 1 tablet (7.5 mg total) by mouth 2 (two) times daily as needed. 60 tablet 2   clonazePAM (KLONOPIN) 1 MG tablet Take 1 tablet (1 mg total) by mouth 2 (two) times daily as needed for anxiety. 20 tablet 1   empagliflozin (JARDIANCE) 10 MG TABS tablet Take 10 mg by mouth daily. 90 tablet 1   escitalopram (LEXAPRO) 10 MG tablet Take 1 tablet (10 mg total) by mouth daily. Take 5mg  x1 week then increase to 10mg  daily. 90 tablet 1   Fluticasone Furoate-Vilanterol (BREO ELLIPTA) 100-25 MCG/INH AEPB Inhale 1 puff into the lungs daily as needed. 28 each 0   lidocaine (XYLOCAINE) 5 % ointment Apply 1 application topically 4 (four) times daily as needed. 30 g 0   liraglutide (VICTOZA) 18 MG/3ML SOPN INJECT 1.2MG   SUBCUTANEOUSLY DAILY AT THE SAME TIME EACH DAY 18 mL 1   losartan (COZAAR) 25 MG tablet Take 1 tablet (25 mg total) by mouth daily. 90 tablet 2   metFORMIN (GLUCOPHAGE) 1000 MG tablet TAKE 1 TABLET BY MOUTH TWICE DAILY WITH A MEAL 180 tablet 2   mupirocin ointment (BACTROBAN) 2 % Apply 1 application topically 2 (two) times daily. To leg 22 g 0   ONETOUCH ULTRA test strip USE AS DIRECTED TWICE DAILY TO CHECK BLOOD SUGAR 100 each 0   pravastatin (PRAVACHOL) 10 MG tablet Take 1 tablet (10 mg total) by mouth daily. 90 tablet 2   TRESIBA FLEXTOUCH 100 UNIT/ML FlexTouch Pen INJECT 20 UNITS SUBCUTANEOUSLY ONCE DAILY 15 mL 0   valACYclovir (VALTREX) 1000 MG tablet Take 1 tablet (1,000 mg total) by mouth 2 (two) times daily. Take for 10 days 20 tablet 0   No current facility-administered medications for this visit.     ALLERGIES:  Patient has no known allergies.  Family History  Problem Relation Age of Onset   Hypertension Father    COPD Father    Emphysema Father    Diabetes Maternal Aunt    Hypertension Mother    Hyperlipidemia Mother    Colon cancer Mother    COPD Paternal Grandfather    Hypertension Brother    Heart disease Neg Hx     Social History   Socioeconomic History   Marital status: Married    Spouse name: Not on file   Number of children: 4   Years of education: 14   Highest education level: Not on file  Occupational History   Occupation: Nutritition   Tobacco Use   Smoking status: Current Every Day Smoker    Packs/day: 1.00    Years: 22.00    Pack years: 22.00    Types: Cigarettes   Smokeless tobacco: Never Used  Substance and Sexual Activity   Alcohol use: Not Currently   Drug use: No   Sexual activity: Yes    Partners: Male    Birth control/protection: I.U.D.  Other Topics Concern   Not on file  Social History Narrative   Born and raised in [place]. Fun: Sleep   Denies any religious beliefs effecting health care.    Social Determinants of Health   Financial Resource Strain:    Difficulty of Paying Living Expenses:   Food Insecurity:    Worried About Programme researcher, broadcasting/film/video in the Last Year:    Barista in the Last Year:   Transportation Needs:    Freight forwarder (Medical):    Lack of Transportation (Non-Medical):   Physical Activity:    Days of Exercise per Week:    Minutes of Exercise per Session:   Stress:    Feeling of Stress :   Social Connections:    Frequency of Communication with Friends and Family:    Frequency of Social Gatherings with Friends and Family:    Attends Religious Services:    Active Member of Clubs or Organizations:    Attends Engineer, structural:    Marital Status:   Intimate Partner Violence:    Fear of Current or Ex-Partner:    Emotionally Abused:    Physically Abused:     Sexually Abused:     ROS  PHYSICAL EXAMINATION:    There were no vitals taken for this visit.    General appearance: alert, cooperative and appears stated age  ASSESSMENT Recent diagnosis of HSV 2,  no concerns for other STD's. Not really sure if the is a primary or recurrent infection.     PLAN Recommended that her husband be tested for HSV 2, blood serology. This will help determine the risk to him I will call in valtrex for future outbreaks If she is having frequent outbreaks or if her husband doesn't have antibodies to HSV 2 we will discuss the option of suppression further Will call in steroid ointment for her focal vulvar pruritus, if she isn't better by next week she will call to be seen.     Romualdo Bolk, MD  Over 20 minutes of total patient care.

## 2020-02-20 ENCOUNTER — Other Ambulatory Visit: Payer: Self-pay

## 2020-02-20 ENCOUNTER — Ambulatory Visit: Payer: BC Managed Care – PPO | Admitting: Family Medicine

## 2020-02-20 ENCOUNTER — Encounter: Payer: Self-pay | Admitting: Family Medicine

## 2020-02-20 VITALS — BP 137/84 | Ht 64.17 in | Wt 180.0 lb

## 2020-02-20 DIAGNOSIS — Z23 Encounter for immunization: Secondary | ICD-10-CM | POA: Diagnosis not present

## 2020-02-20 DIAGNOSIS — I10 Essential (primary) hypertension: Secondary | ICD-10-CM | POA: Diagnosis not present

## 2020-02-20 DIAGNOSIS — Z794 Long term (current) use of insulin: Secondary | ICD-10-CM | POA: Diagnosis not present

## 2020-02-20 DIAGNOSIS — F418 Other specified anxiety disorders: Secondary | ICD-10-CM

## 2020-02-20 DIAGNOSIS — Z1211 Encounter for screening for malignant neoplasm of colon: Secondary | ICD-10-CM

## 2020-02-20 DIAGNOSIS — F17210 Nicotine dependence, cigarettes, uncomplicated: Secondary | ICD-10-CM

## 2020-02-20 DIAGNOSIS — E119 Type 2 diabetes mellitus without complications: Secondary | ICD-10-CM

## 2020-02-20 LAB — POCT GLYCOSYLATED HEMOGLOBIN (HGB A1C): Hemoglobin A1C: 8.3 % — AB (ref 4.0–5.6)

## 2020-02-20 MED ORDER — TRESIBA FLEXTOUCH 100 UNIT/ML ~~LOC~~ SOPN: 25.0000 [IU] | PEN_INJECTOR | Freq: Every day | SUBCUTANEOUS | 2 refills | Status: DC

## 2020-02-20 MED ORDER — OZEMPIC (0.25 OR 0.5 MG/DOSE) 2 MG/1.5ML ~~LOC~~ SOPN: 0.5000 mg | PEN_INJECTOR | SUBCUTANEOUS | 2 refills | Status: DC

## 2020-02-20 NOTE — Progress Notes (Signed)
[name] Hurley - 49 y.o. female MRN [id number]  Date of birth: 28-Sep-1970  Subjective Chief Complaint  Patient presents with  . Diabetes    HPI [name] Hurley is a 49 y.o. female with history of HTN, T2DM and nicotine dependence.  She was also treated for anxiety over the past year related to COVID-19 but she reports that this is much better.  She has discontinued medications related to this.  She will hold on to them in case symptoms return once she returns back to school.    She was also recently diagnosed with HSV2 and started on valtrex.    HTN:  Current management with losartan 25mg  daily.  She is doing well with this and denies side effects. She has not had symptoms related to HTN including chest pain, shortness of breath, palpitations, headache or vision changes.   Diabetes:  She has not been as diligent with diet and exercise recently.  Blood sugars at high have been a little higher.  She is interested in changing to once weekly GLP-1 to lessen injections she is needing each day.      ROS:  A comprehensive ROS was completed and negative except as noted per HPI   No Known Allergies  Past Medical History:  Diagnosis Date  . Diabetes mellitus   . Dyspareunia   . Hypertension   . Sciatica     Past Surgical History:  Procedure Laterality Date  . CESAREAN SECTION    . paragard     removal 7-16 & reinsertion 02-13-15    Social History   Socioeconomic History  . Marital status: Married    Spouse name: Not on file  . Number of children: 4  . Years of education: 28  . Highest education level: Not on file  Occupational History  . Occupation: Nutritition   Tobacco Use  . Smoking status: Current Every Day Smoker    Packs/day: 1.00    Years: 22.00    Pack years: 22.00    Types: Cigarettes  . Smokeless tobacco: Never Used  Substance and Sexual Activity  . Alcohol use: Not Currently  . Drug use: No  . Sexual activity: Yes    Partners: Male    Birth control/protection:  I.U.D.  Other Topics Concern  . Not on file  Social History Narrative   [place] raised [place]. Fun: Sleep   Denies any religious beliefs effecting health care.    Social Determinants of Health   Financial Resource Strain:   . Difficulty of Paying Living Expenses:   Food Insecurity:   . Worried About [place] in the Last Year:   . Programme researcher, broadcasting/film/video in the Last Year:   Transportation Needs:   . Barista (Medical):   Freight forwarder Lack of Transportation (Non-Medical):   Physical Activity:   . Days of Exercise per Week:   . Minutes of Exercise per Session:   Stress:   . Feeling of Stress :   Social Connections:   . Frequency of Communication with Friends and Family:   . Frequency of Social Gatherings with Friends and Family:   . Attends Religious Services:   . Active Member of Clubs or Organizations:   . Attends [employer] Meetings:   Banker Marital Status:     Family History  Problem Relation Age of Onset  . Hypertension Father   . COPD Father   . Emphysema Father   . Diabetes Maternal Aunt   .  Hypertension Mother   . Hyperlipidemia Mother   . Colon cancer Mother   . COPD Paternal Grandfather   . Hypertension Brother   . Heart disease Neg Hx     Health Maintenance  Topic Date Due  . COVID-19 Vaccine (1) Never done  . FOOT EXAM  11/20/2018  . COLONOSCOPY  Never done  . PAP SMEAR-Modifier  02/11/2020  . TETANUS/TDAP  10/26/2020 (Originally 01/05/1988)  . HIV Screening  10/26/2020 (Originally 01/05/1984)  . INFLUENZA VACCINE  02/26/2020  . OPHTHALMOLOGY EXAM  07/25/2020  . HEMOGLOBIN A1C  08/22/2020  . MAMMOGRAM  10/12/2021  . PNEUMOCOCCAL POLYSACCHARIDE VACCINE AGE 6-64 HIGH RISK  Completed  . Hepatitis C Screening  Completed     ----------------------------------------------------------------------------------------------------------------------------------------------------------------------------------------------------------------- Physical  Exam BP (!) 137/84   Ht 5' 4.17" (1.63 m)   Wt 180 lb (81.6 kg)   SpO2 99%   BMI 30.73 kg/m   Physical Exam Constitutional:      Appearance: Normal appearance.  HENT:     Head: Normocephalic.  Eyes:     General: No scleral icterus. Cardiovascular:     Rate and Rhythm: Normal rate and regular rhythm.  Pulmonary:     Effort: Pulmonary effort is normal.     Breath sounds: Normal breath sounds.  Musculoskeletal:     Cervical back: Neck supple.  Neurological:     General: No focal deficit present.     Mental Status: She is alert.  Psychiatric:        Mood and Affect: Mood normal.        Behavior: Behavior normal.    Diabetic Foot Exam - Simple   Simple Foot Form Diabetic Foot exam was performed with the following findings: Yes 02/20/2020  9:14 PM  Visual Inspection No deformities, no ulcerations, no other skin breakdown bilaterally: Yes Sensation Testing Intact to touch and monofilament testing bilaterally: Yes Pulse Check Posterior Tibialis and Dorsalis pulse intact bilaterally: Yes Comments      ------------------------------------------------------------------------------------------------------------------------------------------------------------------------------------------------------------------- Assessment and Plan  Essential hypertension Blood pressure is at goal at for age and co-morbidities.  I recommend she continue losartan.  In addition they were instructed to follow a low sodium diet with regular exercise to help to maintain adequate control of blood pressure.    Type 2 diabetes mellitus without complication, with long-term current use of insulin (HCC) Most recent A1c of  Lab Results  Component Value Date   HGBA1C 8.3 (A) 02/20/2020   indicates diabetes is not well controlled.  She will increase tresiba to 25 units.   Change victoza ozempic to simplify her regimen.  Counseled on healthy, low carb diet and recommend frequent activity to help with  maintaining good control of blood sugars.    Anxiety with depression She has had significant improvement in her symptoms and has discontinued medications for this.  She is holding onto medication to see if she may need once school resumes.  I asked that she contact me if feeling like she needs to restart.    Cigarette nicotine dependence without complication Counseled on smoking cessation.    Meds ordered this encounter  Medications  . insulin degludec (TRESIBA FLEXTOUCH) 100 UNIT/ML FlexTouch Pen    Sig: Inject 0.25 mLs (25 Units total) into the skin daily.    Dispense:  23 mL    Refill:  2  . Semaglutide,0.25 or 0.5MG /DOS, (OZEMPIC, 0.25 OR 0.5 MG/DOSE,) 2 MG/1.5ML SOPN    Sig: Inject 0.375 mLs (0.5 mg total) into the skin once  a week.    Dispense:  3 pen    Refill:  2    Return in about 3 months (around 05/22/2020) for HTN/DM.    This visit occurred during the SARS-CoV-2 public health emergency.  Safety protocols were in place, including screening questions prior to the visit, additional usage of staff PPE, and extensive cleaning of exam room while observing appropriate contact time as indicated for disinfecting solutions.

## 2020-02-20 NOTE — Patient Instructions (Addendum)
Increase tresiba to 25 units I have sent Ozempic over, we'll see if we can get this approved.  See me again in 3 months.

## 2020-02-20 NOTE — Assessment & Plan Note (Signed)
Counseled on smoking cessation  

## 2020-02-20 NOTE — Assessment & Plan Note (Signed)
Most recent A1c of  Lab Results  Component Value Date   HGBA1C 8.3 (A) 02/20/2020   indicates diabetes is not well controlled.  She will increase tresiba to 25 units.   Change victoza ozempic to simplify her regimen.  Counseled on healthy, low carb diet and recommend frequent activity to help with maintaining good control of blood sugars.

## 2020-02-20 NOTE — Assessment & Plan Note (Signed)
Blood pressure is at goal at for age and co-morbidities.  I recommend she continue losartan.  In addition they were instructed to follow a low sodium diet with regular exercise to help to maintain adequate control of blood pressure.

## 2020-02-20 NOTE — Assessment & Plan Note (Addendum)
She has had significant improvement in her symptoms and has discontinued medications for this.  She is holding onto medication to see if she may need once school resumes.  I asked that she contact me if feeling like she needs to restart.

## 2020-03-05 ENCOUNTER — Ambulatory Visit (INDEPENDENT_AMBULATORY_CARE_PROVIDER_SITE_OTHER): Payer: BC Managed Care – PPO | Admitting: Osteopathic Medicine

## 2020-03-05 VITALS — BP 154/84 | HR 82 | Ht 64.0 in | Wt 170.0 lb

## 2020-03-05 DIAGNOSIS — I10 Essential (primary) hypertension: Secondary | ICD-10-CM

## 2020-03-05 MED ORDER — LOSARTAN POTASSIUM 25 MG PO TABS: 50.0000 mg | ORAL_TABLET | Freq: Every day | ORAL | 0 refills | Status: DC

## 2020-03-05 NOTE — Progress Notes (Signed)
Patient is here for blood pressure check.   Previous BP was   169/94Abnormal   1st BP today: 173/81  2nd BP today (after 10  minutes): 154/84  Denies chest pain, dizziness, shortness of breath, severe headache, or nosebleeds.  Taking medication as prescribed. Denies missed doses.

## 2020-03-05 NOTE — Progress Notes (Signed)
BP Readings from Last 3 Encounters:  03/05/20 (!) 154/84  02/20/20 (!) 137/84  01/16/20 128/70

## 2020-03-05 NOTE — Patient Instructions (Signed)
Increase Losartan from 25 mg (1 pill daily) to 50 mg (2 pills daily  -ok to take at the same time). Recheck BP in 1 week.

## 2020-03-07 LAB — COLOGUARD: Cologuard: NEGATIVE

## 2020-03-13 LAB — COLOGUARD: COLOGUARD: NEGATIVE

## 2020-03-15 ENCOUNTER — Ambulatory Visit (INDEPENDENT_AMBULATORY_CARE_PROVIDER_SITE_OTHER): Payer: BC Managed Care – PPO | Admitting: Family Medicine

## 2020-03-15 ENCOUNTER — Encounter: Payer: Self-pay | Admitting: Family Medicine

## 2020-03-15 VITALS — BP 148/84 | HR 89 | Wt 184.0 lb

## 2020-03-15 DIAGNOSIS — I10 Essential (primary) hypertension: Secondary | ICD-10-CM | POA: Diagnosis not present

## 2020-03-15 NOTE — Progress Notes (Signed)
Pt is here for blood pressure check. Denies palpitations, headaches, SOB, chest pains or mood disorder. Per pt, since increase of Losartan x 1 week ago, bilateral leg cramping has gotten worse and is waking her up from her sleep. Initial blood pressure was out of range. After 15 minutes, blood pressure was checked manually and was still out of range. Provider has been made aware. Per provider, pt aware to continue with current medications and keep track of blood pressure readings at home. Pt agreeable with plan.

## 2020-03-15 NOTE — Progress Notes (Signed)
Wouldn't think losartan would cause worsening cramps.  If this persists will update electrolytes. Continue home monitoring of BP.

## 2020-03-16 ENCOUNTER — Encounter: Payer: Self-pay | Admitting: Family Medicine

## 2020-03-23 ENCOUNTER — Encounter: Payer: Self-pay | Admitting: Obstetrics and Gynecology

## 2020-03-23 ENCOUNTER — Telehealth: Payer: Self-pay | Admitting: Family Medicine

## 2020-03-23 NOTE — Telephone Encounter (Signed)
Received a signed medical record request from [place] requesting records and labs and images from 02/20/20 to present

## 2020-03-23 NOTE — Telephone Encounter (Signed)
Printed what was requested and faxed to [phone] and gave Dom the request with patient signature to scan in Epic

## 2020-03-26 ENCOUNTER — Telehealth: Payer: Self-pay | Admitting: Obstetrics and Gynecology

## 2020-03-26 NOTE — Telephone Encounter (Signed)
MyChart message to patient.  ° °Encounter closed.  °

## 2020-03-26 NOTE — Telephone Encounter (Signed)
[name] Hurley  P [facility] Clinical Pool The Betamethasone ointment I can use that for an itch and the Lidocaine ointment for an itch with pain I can't remember the difference

## 2020-04-06 ENCOUNTER — Ambulatory Visit: Payer: BC Managed Care – PPO | Admitting: Certified Nurse Midwife

## 2020-04-10 NOTE — Progress Notes (Signed)
49 y.o. G55P1001 Married Black or [place] American Not Hispanic or Latino female here for annual exam. Patient states that she still itching.  She was seen in 6/21 with a primary HSV outbreak. She feels itchy, chapped at her upper vulva, more itchy at night. This has been going on for months. No further HSV outbreaks She has been using the valisone ointment off and on up to 2-3 x a week at night.   She had a paragard IUD placed in 7/16.  She was having monthly cycles until June, nothing since then. Cycles last for 3-4 days, saturated a pad in up to 3 hours. Tolerable cramps, no BTB.  No significant vasomotor symptoms. Sexually active, sometimes has entry dyspareunia (has for years)     H/O DM, last HgbA1C was 8.3 on 02/20/20  No LMP recorded. (Menstrual status: IUD).          Sexually active: Yes.    The current method of family planning is IUD.    Exercising: Yes.    Walking  Smoker:  Yes 1 pack a day   Health Maintenance: Pap:  02/10/17 WNL  History of abnormal Pap:  no MMG:  10/13/19 density B Bi-rads 1 neg  BMD:   None  Colonoscopy: Cologuard 03/07/20 negative. 2005 neg per patient  TDaP:  02/20/20  Gardasil: NA   reports that she has been smoking cigarettes. She has a 22.00 pack-year smoking history. She has never used smokeless tobacco. She reports previous alcohol use. She reports that she does not use drugs. Occasional ETOH. 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 1 biologic and 3 adopted kids. Younger 3 at home.  26, 21,18, 14. Her 68 year old daughter is having baby next month.   Past Medical History:  Diagnosis Date  . Diabetes mellitus   . Dyspareunia   . Hypertension   . Sciatica     Past Surgical History:  Procedure Laterality Date  . CESAREAN SECTION    . paragard     removal 7-16 & reinsertion 02-13-15    Current Outpatient Medications  Medication Sig Dispense Refill  . betamethasone valerate ointment (VALISONE) 0.1 % Apply 1 application topically  2 (two) times daily. Can use for up to one week 15 g 0  . clonazePAM (KLONOPIN) 1 MG tablet Take 1 tablet (1 mg total) by mouth 2 (two) times daily as needed for anxiety. 20 tablet 1  . empagliflozin (JARDIANCE) 10 MG TABS tablet Take 10 mg by mouth daily. 90 tablet 1  . escitalopram (LEXAPRO) 10 MG tablet Take 1 tablet (10 mg total) by mouth daily. Take 5mg  x1 week then increase to 10mg  daily. 90 tablet 1  . Fluticasone Furoate-Vilanterol (BREO ELLIPTA) 100-25 MCG/INH AEPB Inhale 1 puff into the lungs daily as needed. 28 each 0  . insulin degludec (TRESIBA FLEXTOUCH) 100 UNIT/ML FlexTouch Pen Inject 0.25 mLs (25 Units total) into the skin daily. 23 mL 2  . lidocaine (XYLOCAINE) 5 % ointment Apply 1 application topically 4 (four) times daily as needed. 30 g 0  . losartan (COZAAR) 25 MG tablet Take 2 tablets (50 mg total) by mouth daily. 90 tablet 0  . metFORMIN (GLUCOPHAGE) 1000 MG tablet TAKE 1 TABLET BY MOUTH TWICE DAILY WITH A MEAL 180 tablet 2  . mupirocin ointment (BACTROBAN) 2 % Apply 1 application topically 2 (two) times daily. To leg 22 g 0  . ONETOUCH ULTRA test strip USE AS DIRECTED TWICE DAILY TO CHECK BLOOD SUGAR  100 each 0  . pravastatin (PRAVACHOL) 10 MG tablet Take 1 tablet (10 mg total) by mouth daily. 90 tablet 2  . Semaglutide,0.25 or 0.5MG /DOS, (OZEMPIC, 0.25 OR 0.5 MG/DOSE,) 2 MG/1.5ML SOPN Inject 0.375 mLs (0.5 mg total) into the skin once a week. 3 pen 2  . valACYclovir (VALTREX) 500 MG tablet Take one tablet po BID x 3 days prn. 30 tablet 1   No current facility-administered medications for this visit.    Family History  Problem Relation Age of Onset  . Hypertension Father   . COPD Father   . Emphysema Father   . Diabetes Maternal Aunt   . Hypertension Mother   . Hyperlipidemia Mother   . Colon cancer Mother   . COPD Paternal Grandfather   . Hypertension Brother   . Heart disease Neg Hx     Review of Systems  Genitourinary:       Vaginal itching  All other  systems reviewed and are negative.   Exam:   BP 140/72   Pulse 89   Ht 5' 4.5" (1.638 m)   Wt 179 lb (81.2 kg)   SpO2 99%   BMI 30.25 kg/m   Weight change: @WEIGHTCHANGE @ Height:   Height: 5' 4.5" (163.8 cm)  Ht Readings from Last 3 Encounters:  04/11/20 5' 4.5" (1.638 m)  03/05/20 5\' 4"  (1.626 m)  02/20/20 5' 4.17" (1.63 m)    General appearance: alert, cooperative and appears stated age Head: Normocephalic, without obvious abnormality, atraumatic Neck: no adenopathy, supple, symmetrical, trachea midline and thyroid normal to inspection and palpation Lungs: clear to auscultation bilaterally Cardiovascular: regular rate and rhythm Breasts: normal appearance, no masses or tenderness Abdomen: soft, non-tender; non distended,  no masses,  no organomegaly Extremities: extremities normal, atraumatic, no cyanosis or edema Skin: Skin color, texture, turgor normal. No rashes or lesions Lymph nodes: Cervical, supraclavicular, and axillary nodes normal. No abnormal inguinal nodes palpated Neurologic: Grossly normal   Pelvic: External genitalia:  Whitening on the upper inner labia major. No lesions.               Urethra:  normal appearing urethra with no masses, tenderness or lesions              Bartholins and Skenes: normal                 Vagina: normal appearing vagina with an increase in white thin vaginal discharge              Cervix: no lesions and IUD string 3 cm               Bimanual Exam:  Uterus:  normal size, contour, position, consistency, mobility, non-tender              Adnexa: no mass, fullness, tenderness               Rectovaginal: Confirms               Anus:  normal sphincter tone, no lesions  chaperoned for the exam.  A:  Well Woman with normal exam  Chronic vulvitis  Vaginal d/c  Perimenopause, new oligomenorrhea  Paragard IUD check, doing well  P:   Pap with hpv  Vaginitis panel sent  Cyclic provera  Mammogram and cologuard  UTD  Valisone for vulvitis, use BID x 2 weeks, then can use for up to 2 x a week

## 2020-04-11 ENCOUNTER — Other Ambulatory Visit: Payer: Self-pay

## 2020-04-11 ENCOUNTER — Ambulatory Visit: Payer: BC Managed Care – PPO | Admitting: Obstetrics and Gynecology

## 2020-04-11 ENCOUNTER — Encounter: Payer: Self-pay | Admitting: Obstetrics and Gynecology

## 2020-04-11 ENCOUNTER — Other Ambulatory Visit (HOSPITAL_COMMUNITY)
Admission: RE | Admit: 2020-04-11 | Discharge: 2020-04-11 | Disposition: A | Discharge status: Home / Self Care | Payer: BC Managed Care – PPO | Source: Ambulatory Visit | Attending: Obstetrics and Gynecology | Admitting: Obstetrics and Gynecology

## 2020-04-11 VITALS — BP 140/72 | HR 89 | Ht 64.5 in | Wt 179.0 lb

## 2020-04-11 DIAGNOSIS — N914 Secondary oligomenorrhea: Secondary | ICD-10-CM | POA: Diagnosis not present

## 2020-04-11 DIAGNOSIS — N951 Menopausal and female climacteric states: Secondary | ICD-10-CM

## 2020-04-11 DIAGNOSIS — N763 Subacute and chronic vulvitis: Secondary | ICD-10-CM

## 2020-04-11 DIAGNOSIS — N898 Other specified noninflammatory disorders of vagina: Secondary | ICD-10-CM

## 2020-04-11 DIAGNOSIS — Z124 Encounter for screening for malignant neoplasm of cervix: Secondary | ICD-10-CM | POA: Diagnosis not present

## 2020-04-11 DIAGNOSIS — Z01419 Encounter for gynecological examination (general) (routine) without abnormal findings: Secondary | ICD-10-CM

## 2020-04-11 MED ORDER — MEDROXYPROGESTERONE ACETATE 5 MG PO TABS: ORAL_TABLET | ORAL | 1 refills | Status: DC

## 2020-04-11 MED ORDER — BETAMETHASONE VALERATE 0.1 % EX OINT: TOPICAL_OINTMENT | CUTANEOUS | 0 refills | Status: DC

## 2020-04-11 NOTE — Patient Instructions (Signed)
EXERCISE AND DIET:  We recommended that you start or continue a regular exercise program for good health. Regular exercise means any activity that makes your heart beat faster and makes you sweat.  We recommend exercising at least 30 minutes per day at least 3 days a week, preferably 4 or 5.  We also recommend a diet low in fat and sugar.  Inactivity, poor dietary choices and obesity can cause diabetes, heart attack, stroke, and kidney damage, among others.    ALCOHOL AND SMOKING:  Women should limit their alcohol intake to no more than 7 drinks/beers/glasses of wine (combined, not each!) per week. Moderation of alcohol intake to this level decreases your risk of breast cancer and liver damage. And of course, no recreational drugs are part of a healthy lifestyle.  And absolutely no smoking or even second hand smoke. Most people know smoking can cause heart and lung diseases, but did you know it also contributes to weakening of your bones? Aging of your skin?  Yellowing of your teeth and nails?  CALCIUM AND VITAMIN D:  Adequate intake of calcium and Vitamin D are recommended.  The recommendations for exact amounts of these supplements seem to change often, but generally speaking 1,200 mg of calcium (between diet and supplement) and 800 units of Vitamin D per day seems prudent. Certain women may benefit from higher intake of Vitamin D.  If you are among these women, your doctor will have told you during your visit.    PAP SMEARS:  Pap smears, to check for cervical cancer or precancers,  have traditionally been done yearly, although recent scientific advances have shown that most women can have pap smears less often.  However, every woman still should have a physical exam from her gynecologist every year. It will include a breast check, inspection of the vulva and vagina to check for abnormal growths or skin changes, a visual exam of the cervix, and then an exam to evaluate the size and shape of the uterus and  ovaries.  And after 51 years of age, a rectal exam is indicated to check for rectal cancers. We will also provide age appropriate advice regarding health maintenance, like when you should have certain vaccines, screening for sexually transmitted diseases, bone density testing, colonoscopy, mammograms, etc.   MAMMOGRAMS:  All women over 40 years old should have a yearly mammogram. Many facilities now offer a "3D" mammogram, which may cost around $50 extra out of pocket. If possible,  we recommend you accept the option to have the 3D mammogram performed.  It both reduces the number of women who will be called back for extra views which then turn out to be normal, and it is better than the routine mammogram at detecting truly abnormal areas.    COLON CANCER SCREENING: Now recommend starting at age 45. At this time colonoscopy is not covered for routine screening until 50. There are take home tests that can be done between 45-49.   COLONOSCOPY:  Colonoscopy to screen for colon cancer is recommended for all women at age 50.  We know, you hate the idea of the prep.  We agree, BUT, having colon cancer and not knowing it is worse!!  Colon cancer so often starts as a polyp that can be seen and removed at colonscopy, which can quite literally save your life!  And if your first colonoscopy is normal and you have no family history of colon cancer, most women don't have to have it again for   10 years.  Once every ten years, you can do something that may end up saving your life, right?  We will be happy to help you get it scheduled when you are ready.  Be sure to check your insurance coverage so you understand how much it will cost.  It may be covered as a preventative service at no cost, but you should check your particular policy.      Breast Self-Awareness Breast self-awareness means being familiar with how your breasts look and feel. It involves checking your breasts regularly and reporting any changes to your  health care provider. Practicing breast self-awareness is important. A change in your breasts can be a sign of a serious medical problem. Being familiar with how your breasts look and feel allows you to find any problems early, when treatment is more likely to be successful. All women should practice breast self-awareness, including women who have had breast implants. How to do a breast self-exam One way to learn what is normal for your breasts and whether your breasts are changing is to do a breast self-exam. To do a breast self-exam: Look for Changes  1. Remove all the clothing above your waist. 2. Stand in front of a mirror in a room with good lighting. 3. Put your hands on your hips. 4. Push your hands firmly downward. 5. Compare your breasts in the mirror. Look for differences between them (asymmetry), such as: ? Differences in shape. ? Differences in size. ? Puckers, dips, and bumps in one breast and not the other. 6. Look at each breast for changes in your skin, such as: ? Redness. ? Scaly areas. 7. Look for changes in your nipples, such as: ? Discharge. ? Bleeding. ? Dimpling. ? Redness. ? A change in position. Feel for Changes Carefully feel your breasts for lumps and changes. It is best to do this while lying on your back on the floor and again while sitting or standing in the shower or tub with soapy water on your skin. Feel each breast in the following way:  Place the arm on the side of the breast you are examining above your head.  Feel your breast with the other hand.  Start in the nipple area and make  inch (2 cm) overlapping circles to feel your breast. Use the pads of your three middle fingers to do this. Apply light pressure, then medium pressure, then firm pressure. The light pressure will allow you to feel the tissue closest to the skin. The medium pressure will allow you to feel the tissue that is a little deeper. The firm pressure will allow you to feel the tissue  close to the ribs.  Continue the overlapping circles, moving downward over the breast until you feel your ribs below your breast.  Move one finger-width toward the center of the body. Continue to use the  inch (2 cm) overlapping circles to feel your breast as you move slowly up toward your collarbone.  Continue the up and down exam using all three pressures until you reach your armpit.  Write Down What You Find  Write down what is normal for each breast and any changes that you find. Keep a written record with breast changes or normal findings for each breast. By writing this information down, you do not need to depend only on memory for size, tenderness, or location. Write down where you are in your menstrual cycle, if you are still menstruating. If you are having trouble noticing differences   in your breasts, do not get discouraged. With time you will become more familiar with the variations in your breasts and more comfortable with the exam. How often should I examine my breasts? Examine your breasts every month. If you are breastfeeding, the best time to examine your breasts is after a feeding or after using a breast pump. If you menstruate, the best time to examine your breasts is 5-7 days after your period is over. During your period, your breasts are lumpier, and it may be more difficult to notice changes. When should I see my health care provider? See your health care provider if you notice:  A change in shape or size of your breasts or nipples.  A change in the skin of your breast or nipples, such as a reddened or scaly area.  Unusual discharge from your nipples.  A lump or thick area that was not there before.  Pain in your breasts.  Anything that concerns you.  Perimenopause  Perimenopause is the normal time of life before and after menstrual periods stop completely (menopause). Perimenopause can begin 2-8 years before menopause, and it usually lasts for 1 year after  menopause. During perimenopause, the ovaries may or may not produce an egg. What are the causes? This condition is caused by a natural change in hormone levels that happens as you get older. What increases the risk? This condition is more likely to start at an earlier age if you have certain medical conditions or treatments, including:  A tumor of the pituitary gland in the brain.  A disease that affects the ovaries and hormone production.  Radiation treatment for cancer.  Certain cancer treatments, such as chemotherapy or hormone (anti-estrogen) therapy.  Heavy smoking and excessive alcohol use.  Family history of early menopause. What are the signs or symptoms? Perimenopausal changes affect each woman differently. Symptoms of this condition may include:  Hot flashes.  Night sweats.  Irregular menstrual periods.  Decreased sex drive.  Vaginal dryness.  Headaches.  Mood swings.  Depression.  Memory problems or trouble concentrating.  Irritability.  Tiredness.  Weight gain.  Anxiety.  Trouble getting pregnant. How is this diagnosed? This condition is diagnosed based on your medical history, a physical exam, your age, your menstrual history, and your symptoms. Hormone tests may also be done. How is this treated? In some cases, no treatment is needed. You and your health care provider should make a decision together about whether treatment is necessary. Treatment will be based on your individual condition and preferences. Various treatments are available, such as:  Menopausal hormone therapy (MHT).  Medicines to treat specific symptoms.  Acupuncture.  Vitamin or herbal supplements. Before starting treatment, make sure to let your health care provider know if you have a personal or family history of:  Heart disease.  Breast cancer.  Blood clots.  Diabetes.  Osteoporosis. Follow these instructions at home: Lifestyle  Do not use any products that  contain nicotine or tobacco, such as cigarettes and e-cigarettes. If you need help quitting, ask your health care provider.  Eat a balanced diet that includes fresh fruits and vegetables, whole grains, soybeans, eggs, lean meat, and low-fat dairy.  Get at least 30 minutes of physical activity on 5 or more days each week.  Avoid alcoholic and caffeinated beverages, as well as spicy foods. This may help prevent hot flashes.  Get 7-8 hours of sleep each night.  Dress in layers that can be removed to help you manage hot flashes.    Find ways to manage stress, such as deep breathing, meditation, or journaling. General instructions  Keep track of your menstrual periods, including: ? When they occur. ? How heavy they are and how long they last. ? How much time passes between periods.  Keep track of your symptoms, noting when they start, how often you have them, and how long they last.  Take over-the-counter and prescription medicines only as told by your health care provider.  Take vitamin supplements only as told by your health care provider. These may include calcium, vitamin E, and vitamin D.  Use vaginal lubricants or moisturizers to help with vaginal dryness and improve comfort during sex.  Talk with your health care provider before starting any herbal supplements.  Keep all follow-up visits as told by your health care provider. This is important. This includes any group therapy or counseling. Contact a health care provider if:  You have heavy vaginal bleeding or pass blood clots.  Your period lasts more than 2 days longer than normal.  Your periods are recurring sooner than 21 days.  You bleed after having sex. Get help right away if:  You have chest pain, trouble breathing, or trouble talking.  You have severe depression.  You have pain when you urinate.  You have severe headaches.  You have vision problems. Summary  Perimenopause is the time when a woman's body  begins to move into menopause. This may happen naturally or as a result of other health problems or medical treatments.  Perimenopause can begin 2-8 years before menopause, and it usually lasts for 1 year after menopause.  Perimenopausal symptoms can be managed through medicines, lifestyle changes, and complementary therapies such as acupuncture. This information is not intended to replace advice given to you by your health care provider. Make sure you discuss any questions you have with your health care provider. Document Revised: 06/26/2017 Document Reviewed: 08/19/2016 Elsevier Patient Education  2020 [employer] Inc[employer]  

## 2020-04-12 LAB — CYTOLOGY - PAP
Comment: NEGATIVE
Diagnosis: NEGATIVE
High risk HPV: NEGATIVE

## 2020-04-14 LAB — NUSWAB BV AND CANDIDA, NAA
Candida albicans, NAA: POSITIVE — AB
Candida glabrata, NAA: NEGATIVE

## 2020-04-17 ENCOUNTER — Telehealth: Payer: Self-pay | Admitting: *Deleted

## 2020-04-17 NOTE — Telephone Encounter (Signed)
Leda Min, RN  04/17/2020 8:49 AM EDT Back to Top    Spoke with patient, patient is at work, she will return call to office later today to discuss results.   See telephone encounter dated 04/17/20

## 2020-04-17 NOTE — Telephone Encounter (Signed)
-----   Message from Romualdo Bolk, MD sent at 04/16/2020  5:37 PM EDT ----- Please inform the patient that her vaginitis panel is + for yeast and treat with diflucan 150 mg x 1, may repeat in 72 hours if still symptomatic. #2, no refills

## 2020-04-20 MED ORDER — FLUCONAZOLE 150 MG PO TABS: ORAL_TABLET | ORAL | 0 refills | Status: DC

## 2020-04-20 NOTE — Telephone Encounter (Signed)
Call to patient, no answer, name identified on voicemail, left detailed message, ok per dpr.  Advised as seen below per Dr. Oscar La, RX has been sent to [facility] on [address], [place]. Return call to office if any additional questions.   Encounter closed.

## 2020-05-22 ENCOUNTER — Ambulatory Visit (INDEPENDENT_AMBULATORY_CARE_PROVIDER_SITE_OTHER): Payer: BC Managed Care – PPO | Admitting: Family Medicine

## 2020-05-22 ENCOUNTER — Other Ambulatory Visit: Payer: Self-pay

## 2020-05-22 VITALS — Wt 179.0 lb

## 2020-05-22 DIAGNOSIS — Z23 Encounter for immunization: Secondary | ICD-10-CM

## 2020-05-22 NOTE — Progress Notes (Signed)
Medical screening examination/treatment was performed by qualified clinical staff member and as supervising physician I was immediately available for consultation/collaboration. I have reviewed documentation and agree with assessment and plan.  Dellis Voght, DO  

## 2020-05-22 NOTE — Progress Notes (Signed)
Pt presents for flu vaccination.   Location: Right Deltoid  Patient tolerated well.

## 2020-05-23 ENCOUNTER — Ambulatory Visit: Payer: BC Managed Care – PPO | Admitting: Family Medicine

## 2020-06-29 ENCOUNTER — Encounter: Payer: Self-pay | Admitting: Obstetrics and Gynecology

## 2020-06-29 ENCOUNTER — Telehealth: Payer: Self-pay | Admitting: Obstetrics and Gynecology

## 2020-06-29 ENCOUNTER — Ambulatory Visit: Payer: BC Managed Care – PPO | Admitting: Nurse Practitioner

## 2020-06-29 ENCOUNTER — Other Ambulatory Visit: Payer: Self-pay

## 2020-06-29 ENCOUNTER — Encounter: Payer: Self-pay | Admitting: Nurse Practitioner

## 2020-06-29 VITALS — BP 122/70 | HR 80 | Resp 16 | Wt 183.0 lb

## 2020-06-29 DIAGNOSIS — R309 Painful micturition, unspecified: Secondary | ICD-10-CM | POA: Diagnosis not present

## 2020-06-29 DIAGNOSIS — B373 Candidiasis of vulva and vagina: Secondary | ICD-10-CM

## 2020-06-29 DIAGNOSIS — B3731 Acute candidiasis of vulva and vagina: Secondary | ICD-10-CM

## 2020-06-29 MED ORDER — TERCONAZOLE 0.4 % VA CREA: 1.0000 | TOPICAL_CREAM | Freq: Every day | VAGINAL | 0 refills | Status: DC

## 2020-06-29 MED ORDER — LIDOCAINE 5 % EX OINT: 1.0000 "application " | TOPICAL_OINTMENT | Freq: Four times a day (QID) | CUTANEOUS | 1 refills | Status: AC | PRN

## 2020-06-29 MED ORDER — FLUCONAZOLE 150 MG PO TABS: 150.0000 mg | ORAL_TABLET | Freq: Once | ORAL | 1 refills | Status: AC

## 2020-06-29 NOTE — Telephone Encounter (Signed)
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 Pt sent the mychart message below. Pt states having vaginal itching, pain with urination x 2 days.  Denies vaginal discharge, odor, HSV outbreak. Pt states only took one 500mg  tablet of Valtrex daily for last 2 days since sx started. Pt has not used any other OTC medications. Pt states Rx Valtrex has not resolved sx. Denies fever, chills. abd pain and all other UTI sx.  Pt advised to have OV for evaluation. Pt agreeable.  Pt scheduled today 12/3 at 3 pm with 14/3, NP. Pt agreeable to date and time of appt.   Routing to [place], NP for review Encounter closed   Me to [name], [name]     06/29/20 11:02 AM Hello 14/3/21,  I just called and left a voicemail message. Please return my call when you can at [phone] between now and 12 noon or 1-4 pm today.    Thank you!  [phone], RN-triage   Last read by [name] at 12:19 PM on 06/29/2020. 14/09/2019 to [name], MD     06/29/20 8:24 AM I need refills on the creams that you prescribed I believe I am having an outbreak or a horrible yeast infection. Do I need to make an appointment

## 2020-06-29 NOTE — Progress Notes (Signed)
GYNECOLOGY  VISIT  CC:   Vaginal burning and and chronic vaginal itching.  Chronic itching has been going on x 1 week. Has pain when she urinates but it is pain at the level of the skin.  HPI: 49 y.o. G8P1001 Married [place] American female here for vaginal itching & pain with urination. poct urine- pt unable to urinate while here in office.  Still trying to get Diabetes under good control   Pt feels very certain that there is no infidelity in her relationship.  GYNECOLOGIC HISTORY: No LMP recorded. (Menstrual status: IUD). 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   Essential hypertension 11/19/2017   Dyslipidemia 09/19/2015   Muscle cramp 10/17/2014   Type 2 diabetes mellitus without complication, with long-term current use of insulin (HCC) 11/24/2013    Past Medical History:  Diagnosis Date   Diabetes mellitus    Dyspareunia    Hypertension    Sciatica     Past Surgical History:  Procedure Laterality Date   CESAREAN SECTION     paragard     removal 7-16 & reinsertion 02-13-15    MEDS:   Current Outpatient Medications on File Prior to Visit  Medication Sig Dispense Refill   betamethasone valerate ointment (VALISONE) 0.1 % Use a pea sized amount topically BID for 2 weeks, can then use 2x a week if needed 30 g 0   clonazePAM (KLONOPIN) 1 MG tablet Take 1 tablet (1 mg total) by mouth 2 (two) times daily as needed for anxiety. 20 tablet 1   empagliflozin (JARDIANCE) 10 MG TABS tablet Take 10 mg by mouth daily. 90 tablet 1   escitalopram (LEXAPRO) 10 MG tablet Take 1 tablet (10 mg total) by mouth daily. Take 5mg  x1 week then increase to 10mg   daily. 90 tablet 1   fluconazole (DIFLUCAN) 150 MG tablet Take one tablet (150 mg) now, may repeat in 72 hours if symptoms still present. 2 tablet 0   Fluticasone Furoate-Vilanterol (BREO ELLIPTA) 100-25 MCG/INH AEPB Inhale 1 puff into the lungs daily as needed. 28 each 0   lidocaine (XYLOCAINE) 5 % ointment Apply 1 application topically 4 (four) times daily as needed. 30 g 0   losartan (COZAAR) 25 MG tablet Take 2 tablets (50 mg total) by mouth daily. 90 tablet 0   medroxyPROGESTERone (PROVERA) 5 MG tablet Take one tablet a day for 5 days now and every other month if no spontaneous menses. 15 tablet 1   metFORMIN (GLUCOPHAGE) 1000 MG tablet TAKE 1 TABLET BY MOUTH TWICE DAILY WITH A MEAL 180 tablet 2   mupirocin ointment (BACTROBAN) 2 % Apply 1 application topically 2 (two) times daily. To leg 22 g 0   ONETOUCH ULTRA test strip USE AS DIRECTED TWICE DAILY TO CHECK BLOOD SUGAR 100 each 0   pravastatin (PRAVACHOL) 10 MG tablet Take 1 tablet (10 mg total) by mouth daily. 90 tablet 2   Semaglutide,0.25 or 0.5MG /DOS, (OZEMPIC, 0.25 OR 0.5 MG/DOSE,) 2 MG/1.5ML SOPN Inject 0.375 mLs (0.5 mg total) into the skin once a week. 3 pen 2   valACYclovir (VALTREX) 500 MG tablet Take one tablet po BID x 3 days prn. 30 tablet 1   No current facility-administered medications  on file prior to visit.    ALLERGIES: Patient has no known allergies.  Family History  Problem Relation Age of Onset   Hypertension Father    COPD Father    Emphysema Father    Diabetes Maternal Aunt    Hypertension Mother    Hyperlipidemia Mother    Colon cancer Mother    COPD Paternal Grandfather    Hypertension Brother    Heart disease Neg Hx     Review of Systems  Constitutional: Negative.   HENT: Negative.   Eyes: Negative.   Respiratory: Negative.   Cardiovascular: Negative.   Gastrointestinal: Negative.   Endocrine: Negative.   Genitourinary:       Vaginal itching & pain with urination   Musculoskeletal: Negative.   Skin: Negative.   Allergic/Immunologic: Negative.   Neurological: Negative.   Hematological: Negative.   Psychiatric/Behavioral: Negative.     PHYSICAL EXAMINATION:    Wt 183 lb (83 kg)    BMI 30.93 kg/m     General appearance: alert, cooperative and appears stated age Pt is in visible discomfort with sitting.  Lymph:  no inguinal LAD noted  Pelvic: External genitalia:  Swollen, red, coated in white discharge, tender, unable to tolerate thorough assessment              Urethra:  Red, swollen              Bartholins and Skenes: normal                 Vagina: unable to assess with speculum, inserted q tip for wet mount. Wet mount: large yeast, neg clue, neg trich, 3-5 WBC per field, pH= 4.5  Unable to see any evidence of HSV lesions but exam was limited                Chaperone, Carollee Herter, CMA, was present for exam.  Assessment: VVC  Plan: Diflucan Disp: 1 Terazol 4%, 7 day Lidocaine topical gel, refill Advised patient that unable to see any evidence of HSV outbreak, she can take Valtrex for prevention if desired while she is treating severe yeast infection Please return to clinic if no improvement for more thorough assessment

## 2020-06-29 NOTE — Patient Instructions (Signed)
Start with the oral pill for yeast (fluconazole). The down side to this medication is that it takes 24 hours to start working. You may use the Terazol in the vagina at night x 7 nights and that should give you relief.  Also, you may apply the lidocaine for additional relief until the medication kicks in.  Please return if your symptoms do not improve  Vaginal Yeast Infection, Adult  Vaginal yeast infection is a condition that causes vaginal discharge as well as soreness, swelling, and redness (inflammation) of the vagina. This is a common condition. Some women get this infection frequently. What are the causes? This condition is caused by a change in the normal balance of the yeast (candida) and bacteria that live in the vagina. This change causes an overgrowth of yeast, which causes the inflammation. What increases the risk? The condition is more likely to develop in women who:  Take antibiotic medicines.  Have diabetes.  Take birth control pills.  Are pregnant.  Douche often.  Have a weak body defense system (immune system).  Have been taking steroid medicines for a long time.  Frequently wear tight clothing. What are the signs or symptoms? Symptoms of this condition include:  White, thick, creamy vaginal discharge.  Swelling, itching, redness, and irritation of the vagina. The lips of the vagina (vulva) may be affected as well.  Pain or a burning feeling while urinating.  Pain during sex. How is this diagnosed? This condition is diagnosed based on:  Your medical history.  A physical exam.  A pelvic exam. Your health care provider will examine a sample of your vaginal discharge under a microscope. Your health care provider may send this sample for testing to confirm the diagnosis. How is this treated? This condition is treated with medicine. Medicines may be over-the-counter or prescription. You may be told to use one or more of the following:  Medicine that is  taken by mouth (orally).  Medicine that is applied as a cream (topically).  Medicine that is inserted directly into the vagina (suppository). Follow these instructions at home:  Lifestyle  Do not have sex until your health care provider approves. Tell your sex partner that you have a yeast infection. That person should go to his or her health care provider and ask if they should also be treated.  Do not wear tight clothes, such as pantyhose or tight pants.  Wear breathable cotton underwear. General instructions  Take or apply over-the-counter and prescription medicines only as told by your health care provider.  Eat more yogurt. This may help to keep your yeast infection from returning.  Do not use tampons until your health care provider approves.  Try taking a sitz bath to help with discomfort. This is a warm water bath that is taken while you are sitting down. The water should only come up to your hips and should cover your buttocks. Do this 3-4 times per day or as told by your health care provider.  Do not douche.  If you have diabetes, keep your blood sugar levels under control.  Keep all follow-up visits as told by your health care provider. This is important. Contact a health care provider if:  You have a fever.  Your symptoms go away and then return.  Your symptoms do not get better with treatment.  Your symptoms get worse.  You have new symptoms.  You develop blisters in or around your vagina.  You have blood coming from your vagina and  it is not your menstrual period.  You develop pain in your abdomen. Summary  Vaginal yeast infection is a condition that causes discharge as well as soreness, swelling, and redness (inflammation) of the vagina.  This condition is treated with medicine. Medicines may be over-the-counter or prescription.  Take or apply over-the-counter and prescription medicines only as told by your health care provider.  Do not douche. Do  not have sex or use tampons until your health care provider approves.  Contact a health care provider if your symptoms do not get better with treatment or your symptoms go away and then return. This information is not intended to replace advice given to you by your health care provider. Make sure you discuss any questions you have with your health care provider. Document Revised: 02/11/2019 Document Reviewed: 11/30/2017 Elsevier Patient Education  2020 [employer].

## 2020-06-29 NOTE — Telephone Encounter (Signed)
Patient returned a call to [facility], see MyChart message.

## 2020-07-11 ENCOUNTER — Encounter: Payer: Self-pay | Admitting: Family Medicine

## 2020-07-11 ENCOUNTER — Ambulatory Visit (INDEPENDENT_AMBULATORY_CARE_PROVIDER_SITE_OTHER): Payer: BC Managed Care – PPO | Admitting: Family Medicine

## 2020-07-11 VITALS — BP 150/79 | HR 67 | Wt 184.0 lb

## 2020-07-11 DIAGNOSIS — E119 Type 2 diabetes mellitus without complications: Secondary | ICD-10-CM | POA: Diagnosis not present

## 2020-07-11 DIAGNOSIS — Z794 Long term (current) use of insulin: Secondary | ICD-10-CM

## 2020-07-11 DIAGNOSIS — F418 Other specified anxiety disorders: Secondary | ICD-10-CM

## 2020-07-11 DIAGNOSIS — Z23 Encounter for immunization: Secondary | ICD-10-CM | POA: Diagnosis not present

## 2020-07-11 DIAGNOSIS — I10 Essential (primary) hypertension: Secondary | ICD-10-CM

## 2020-07-11 DIAGNOSIS — E785 Hyperlipidemia, unspecified: Secondary | ICD-10-CM

## 2020-07-11 DIAGNOSIS — F17210 Nicotine dependence, cigarettes, uncomplicated: Secondary | ICD-10-CM

## 2020-07-11 LAB — POCT GLYCOSYLATED HEMOGLOBIN (HGB A1C): Hemoglobin A1C: 8.5 % — AB (ref 4.0–5.6)

## 2020-07-11 MED ORDER — PRAVASTATIN SODIUM 10 MG PO TABS
10.0000 mg | ORAL_TABLET | Freq: Every day | ORAL | 2 refills | Status: DC
Start: 2020-07-11 — End: 2021-10-21

## 2020-07-11 MED ORDER — METFORMIN HCL 1000 MG PO TABS: ORAL_TABLET | ORAL | 2 refills | Status: DC

## 2020-07-11 MED ORDER — TRESIBA FLEXTOUCH 100 UNIT/ML ~~LOC~~ SOPN: 25.0000 [IU] | PEN_INJECTOR | Freq: Every day | SUBCUTANEOUS | 0 refills | Status: AC

## 2020-07-11 NOTE — Patient Instructions (Signed)
Great to see you today! Go ahead and try the Ozempic, weekly.  Let's follow up in about 3 months.

## 2020-07-11 NOTE — Assessment & Plan Note (Signed)
Counseled extensively on quitting smoking however she does not feel that now is a good time for her to quit.

## 2020-07-11 NOTE — Assessment & Plan Note (Signed)
Increased anxiety related to Covid.  She will continue Lexapro and BuSpar with clonazepam as needed.

## 2020-07-11 NOTE — Assessment & Plan Note (Signed)
Blood sugars are stable however diabetes remains uncontrolled. She will continue current medications I will give her 3 additional months to work on lifestyle change, however we discussed that if glucose is not improving will need to continue to titrate her [place].

## 2020-07-11 NOTE — Progress Notes (Signed)
[name] Hurley - 49 y.o. female MRN [id number]  Date of birth: 1971/04/30  Subjective Chief Complaint  Patient presents with  . Hypertension    HPI [name] Hurley is a 49 year old female with history of hypertension, type 2 diabetes, hyperlipidemia, anxiety with depression and nicotine dependence here today for follow-up visit.  Her diabetes is currently managed with a combination of metformin, Jardiance, Victoza and [place].  She was prescribed Ozempic at her last appointment however she had several Victoza pens left and has continued to use this.  She does plan to transition to Ozempic when she is out of these.  [name] Hurley is currently at 25 units.  She reports that she has been pretty bad about following a healthy diet.  She plans to make changes to this to help improve her blood sugars.  Hypertension is currently managed with losartan 25 mg daily.  She is taking this as directed.  She does report increased stress recently related to increase in Covid cases.  She has not had any symptoms related to hypertension including chest pain, shortness of breath, palpitations, headache or vision changes.  She has been consuming a fairly high salt diet.  She does also continue to smoke.  As above her anxiety has gotten a little worse.  She is taking Lexapro and BuSpar daily with clonazepam on rare occasions.  She does also continue to see a therapist.  ROS:  A comprehensive ROS was completed and negative except as noted per HPI  No Known Allergies  Past Medical History:  Diagnosis Date  . Diabetes mellitus   . Dyspareunia   . Hypertension   . Sciatica     Past Surgical History:  Procedure Laterality Date  . CESAREAN SECTION    . paragard     removal 7-16 & reinsertion 02-13-15    Social History   Socioeconomic History  . Marital status: Married    Spouse name: Not on file  . Number of children: 4  . Years of education: 40  . Highest education level: Not on file  Occupational History  .  Occupation: Nutritition   Tobacco Use  . Smoking status: Current Every Day Smoker    Packs/day: 1.00    Years: 22.00    Pack years: 22.00    Types: Cigarettes  . Smokeless tobacco: Never Used  Substance and Sexual Activity  . Alcohol use: Not Currently  . Drug use: No  . Sexual activity: Yes    Partners: Male    Birth control/protection: I.U.D.  Other Topics Concern  . Not on file  Social History Narrative   Born and raised in [place]. Fun: Sleep   Denies any religious beliefs effecting health care.    Social Determinants of Health   Financial Resource Strain: Not on file  Food Insecurity: Not on file  Transportation Needs: Not on file  Physical Activity: Not on file  Stress: Not on file  Social Connections: Not on file    Family History  Problem Relation Age of Onset  . Hypertension Father   . COPD Father   . Emphysema Father   . Diabetes Maternal Aunt   . Hypertension Mother   . Hyperlipidemia Mother   . Colon cancer Mother   . COPD Paternal Grandfather   . Hypertension Brother   . Heart disease Neg Hx     Health Maintenance  Topic Date Due  . COVID-19 Vaccine (3 - Booster for Pfizer series) 05/15/2020  . HIV Screening  10/26/2020 (Originally 01/05/1984)  .  OPHTHALMOLOGY EXAM  07/25/2020  . HEMOGLOBIN A1C  01/09/2021  . FOOT EXAM  02/19/2021  . MAMMOGRAM  10/12/2021  . Fecal DNA (Cologuard)  03/08/2023  . PAP SMEAR-Modifier  04/12/2023  . TETANUS/TDAP  02/19/2030  . INFLUENZA VACCINE  Completed  . PNEUMOCOCCAL POLYSACCHARIDE VACCINE AGE 63-64 HIGH RISK  Completed  . Hepatitis C Screening  Completed     ----------------------------------------------------------------------------------------------------------------------------------------------------------------------------------------------------------------- Physical Exam BP (!) 150/79   Pulse 67   Wt 184 lb (83.5 kg)   SpO2 97%   BMI 31.10 kg/m   Physical Exam Constitutional:      Appearance: Normal  appearance.  HENT:     Head: Normocephalic and atraumatic.  Eyes:     General: No scleral icterus. Cardiovascular:     Rate and Rhythm: Normal rate and regular rhythm.  Pulmonary:     Effort: Pulmonary effort is normal.     Breath sounds: Normal breath sounds.  Musculoskeletal:     Cervical back: Neck supple.  Skin:    General: Skin is warm and dry.  Neurological:     General: No focal deficit present.     Mental Status: She is alert.     ------------------------------------------------------------------------------------------------------------------------------------------------------------------------------------------------------------------- Assessment and Plan  Essential hypertension Her blood pressure is elevated today however she appears anxious.  I recommend that she check readings at home.  If her blood pressure readings remain elevated at home we will need to increase her losartan.  Type 2 diabetes mellitus without complication, with long-term current use of insulin (HCC) Blood sugars are stable however diabetes remains uncontrolled. She will continue current medications I will give her 3 additional months to work on lifestyle change, however we discussed that if glucose is not improving will need to continue to titrate her [place].  Anxiety with depression Increased anxiety related to Covid.  She will continue Lexapro and BuSpar with clonazepam as needed.  Cigarette nicotine dependence without complication Counseled extensively on quitting smoking however she does not feel that now is a good time for her to quit.  Dyslipidemia Tolerating pravastatin well, continue.   Meds ordered this encounter  Medications  . metFORMIN (GLUCOPHAGE) 1000 MG tablet    Sig: TAKE 1 TABLET BY MOUTH TWICE DAILY WITH A MEAL    Dispense:  180 tablet    Refill:  2  . pravastatin (PRAVACHOL) 10 MG tablet    Sig: Take 1 tablet (10 mg total) by mouth daily.    Dispense:  90 tablet     Refill:  2  . insulin degludec (TRESIBA FLEXTOUCH) 100 UNIT/ML FlexTouch Pen    Sig: Inject 25 Units into the skin daily.    Dispense:  22.5 mL    Refill:  0    Return in about 3 months (around 10/09/2020) for T2DM/HTN/Shingles #2.    This visit occurred during the SARS-CoV-2 public health emergency.  Safety protocols were in place, including screening questions prior to the visit, additional usage of staff PPE, and extensive cleaning of exam room while observing appropriate contact time as indicated for disinfecting solutions.

## 2020-07-11 NOTE — Assessment & Plan Note (Deleted)
Blood sugars are stable however diabetes remains uncontrolled. She will continue current medications I will give her 3 additional months to work on lifestyle change, however we discussed that if glucose is not improving will need to continue to titrate her Tresiba. 

## 2020-07-11 NOTE — Assessment & Plan Note (Signed)
Her blood pressure is elevated today however she appears anxious.  I recommend that she check readings at home.  If her blood pressure readings remain elevated at home we will need to increase her losartan.

## 2020-07-11 NOTE — Assessment & Plan Note (Signed)
Tolerating pravastatin well, continue. 

## 2020-09-01 ENCOUNTER — Other Ambulatory Visit: Payer: Self-pay | Admitting: Obstetrics and Gynecology

## 2020-09-03 ENCOUNTER — Other Ambulatory Visit: Payer: Self-pay | Admitting: Family Medicine

## 2020-09-03 NOTE — Telephone Encounter (Signed)
Medication refill request: valisone ointment   Last AEX:  04-11-20 JJ Next AEX: 04-15-21 Last MMG (if hormonal medication request): 10-13-19 density b/birads 1 negative Refill authorized: Today, please advise.   Medication pended for #30g, 0RF. Please refill if appropriate.

## 2020-09-16 ENCOUNTER — Other Ambulatory Visit: Payer: Self-pay | Admitting: Family Medicine

## 2020-10-09 ENCOUNTER — Encounter: Payer: Self-pay | Admitting: Family Medicine

## 2020-10-09 ENCOUNTER — Ambulatory Visit (INDEPENDENT_AMBULATORY_CARE_PROVIDER_SITE_OTHER): Payer: BC Managed Care – PPO | Admitting: Family Medicine

## 2020-10-09 ENCOUNTER — Other Ambulatory Visit: Payer: Self-pay

## 2020-10-09 VITALS — BP 160/100 | HR 102 | Temp 97.8°F | Wt 182.8 lb

## 2020-10-09 DIAGNOSIS — E119 Type 2 diabetes mellitus without complications: Secondary | ICD-10-CM | POA: Diagnosis not present

## 2020-10-09 DIAGNOSIS — I10 Essential (primary) hypertension: Secondary | ICD-10-CM

## 2020-10-09 DIAGNOSIS — Z23 Encounter for immunization: Secondary | ICD-10-CM

## 2020-10-09 DIAGNOSIS — Z794 Long term (current) use of insulin: Secondary | ICD-10-CM

## 2020-10-09 DIAGNOSIS — E785 Hyperlipidemia, unspecified: Secondary | ICD-10-CM

## 2020-10-09 DIAGNOSIS — F418 Other specified anxiety disorders: Secondary | ICD-10-CM

## 2020-10-09 MED ORDER — LOSARTAN POTASSIUM 50 MG PO TABS: 50.0000 mg | ORAL_TABLET | Freq: Every day | ORAL | 1 refills | Status: DC

## 2020-10-09 NOTE — Assessment & Plan Note (Signed)
BP elevated today. Recommend that she work on dietary changes and check BP at home.  She will return in about 1 month for nurse visit.  If BP remains elevated we'll need to increase her losartan to 100mg .

## 2020-10-09 NOTE — Patient Instructions (Addendum)
Great to see you today! Please have labs completed.  Work on diet to help with blood pressure.  See me again in 4 months.

## 2020-10-09 NOTE — Progress Notes (Signed)
[name] Hurley - 50 y.o. female MRN [id number]  Date of birth: Sep 22, 1970  Subjective Chief Complaint  Patient presents with  . Hypertension  . Diabetes    HPI [name] Hurley is a 50 y.o. female here today for follow up of HTN and diabetes.    Recently transitioned from Victoza to Ozempic.  She continues on jardiance, metformin and tresiba as well.  Blood sugars have been around 110-120 fasting.  She denies hypoglycemia.  She has not been as diligent with her diet recently and is not exercising.  She is tolerating pravastatin for treatment of associated HLD.   BP elevated today.  She is taking losartan 50mg  daily.  She has stopped monitoring BP at home.  She denies side effects related to medications.  She has not had chest pain, shortness of breath, palpitations, headache or vision changes.    She feels like anxiety is much better controlled.  She is no longer taking lexapro.    ROS:  A comprehensive ROS was completed and negative except as noted per HPI  No Known Allergies  Past Medical History:  Diagnosis Date  . Diabetes mellitus   . Dyspareunia   . Hypertension   . Sciatica     Past Surgical History:  Procedure Laterality Date  . CESAREAN SECTION    . paragard     removal 7-16 & reinsertion 02-13-15    Social History   Socioeconomic History  . Marital status: Married    Spouse name: Not on file  . Number of children: 4  . Years of education: 31  . Highest education level: Not on file  Occupational History  . Occupation: Nutritition   Tobacco Use  . Smoking status: Current Every Day Smoker    Packs/day: 1.00    Years: 22.00    Pack years: 22.00    Types: Cigarettes  . Smokeless tobacco: Never Used  Substance and Sexual Activity  . Alcohol use: Not Currently  . Drug use: No  . Sexual activity: Yes    Partners: Male    Birth control/protection: I.U.D.  Other Topics Concern  . Not on file  Social History Narrative   [place] Fun: Sleep    Denies any religious beliefs effecting health care.    Social Determinants of Health   Financial Resource Strain: Not on file  Food Insecurity: Not on file  Transportation Needs: Not on file  Physical Activity: Not on file  Stress: Not on file  Social Connections: Not on file    Family History  Problem Relation Age of Onset  . Hypertension Father   . COPD Father   . Emphysema Father   . Diabetes Maternal Aunt   . Hypertension Mother   . Hyperlipidemia Mother   . Colon cancer Mother   . COPD Paternal Grandfather   . Hypertension Brother   . Heart disease Neg Hx     Health Maintenance  Topic Date Due  . OPHTHALMOLOGY EXAM  07/25/2020  . HIV Screening  10/26/2020 (Originally 01/05/1984)  . HEMOGLOBIN A1C  01/09/2021  . FOOT EXAM  02/19/2021  . MAMMOGRAM  10/12/2021  . Fecal DNA (Cologuard)  03/08/2023  . PAP SMEAR-Modifier  04/12/2023  . TETANUS/TDAP  02/19/2030  . INFLUENZA VACCINE  Completed  . PNEUMOCOCCAL POLYSACCHARIDE VACCINE AGE 79-64 HIGH RISK  Completed  . COVID-19 Vaccine  Completed  . Hepatitis C Screening  Completed  . HPV VACCINES  Aged Out     ----------------------------------------------------------------------------------------------------------------------------------------------------------------------------------------------------------------- Physical  Exam BP (!) 160/100 (BP Location: Left Arm, Patient Position: Sitting, Cuff Size: Normal)   Pulse (!) 102   Temp 97.8 F (36.6 C)   Wt 182 lb 12.8 oz (82.9 kg)   SpO2 96%   BMI 30.89 kg/m   Physical Exam Constitutional:      Appearance: Normal appearance.  Eyes:     General: No scleral icterus. Cardiovascular:     Rate and Rhythm: Normal rate and regular rhythm.  Pulmonary:     Effort: Pulmonary effort is normal.     Breath sounds: Normal breath sounds.  Neurological:     General: No focal deficit present.     Mental Status: She is alert.  Psychiatric:        Mood and Affect: Mood  normal.        Behavior: Behavior normal.     ------------------------------------------------------------------------------------------------------------------------------------------------------------------------------------------------------------------- Assessment and Plan  Essential hypertension BP elevated today. Recommend that she work on dietary changes and check BP at home.  She will return in about 1 month for nurse visit.  If BP remains elevated we'll need to increase her losartan to 100mg .   Type 2 diabetes mellitus without complication, with long-term current use of insulin (HCC) Blood sugars at home have been pretty well controlled. Update a1c today.   Dyslipidemia Update lipid panel.  She will continue pravastatin for now.    Anxiety with depression Improved.  She has discontinued lexapro.     Meds ordered this encounter  Medications  . losartan (COZAAR) 50 MG tablet    Sig: Take 1 tablet (50 mg total) by mouth daily.    Dispense:  90 tablet    Refill:  1    Return in about 4 months (around 02/08/2021) for HTN/DM.    This visit occurred during the SARS-CoV-2 public health emergency.  Safety protocols were in place, including screening questions prior to the visit, additional usage of staff PPE, and extensive cleaning of exam room while observing appropriate contact time as indicated for disinfecting solutions.

## 2020-10-09 NOTE — Assessment & Plan Note (Signed)
Improved.  She has discontinued lexapro.

## 2020-10-09 NOTE — Assessment & Plan Note (Signed)
Update lipid panel.  She will continue pravastatin for now.

## 2020-10-09 NOTE — Assessment & Plan Note (Signed)
Blood sugars at home have been pretty well controlled. Update a1c today.

## 2020-10-10 LAB — COMPLETE METABOLIC PANEL WITH GFR
AG Ratio: 1.6 (calc) (ref 1.0–2.5)
ALT: 8 U/L (ref 6–29)
AST: 14 U/L (ref 10–35)
Albumin: 4.4 g/dL (ref 3.6–5.1)
Alkaline phosphatase (APISO): 82 U/L (ref 37–153)
BUN/Creatinine Ratio: 16 (calc) (ref 6–22)
BUN: 20 mg/dL (ref 7–25)
CO2: 27 mmol/L (ref 20–32)
Calcium: 10.1 mg/dL (ref 8.6–10.4)
Chloride: 99 mmol/L (ref 98–110)
Creat: 1.28 mg/dL — ABNORMAL HIGH (ref 0.50–1.05)
GFR, Est African American: 56 mL/min/{1.73_m2} — ABNORMAL LOW (ref >=60)
GFR, Est Non African American: 48 mL/min/{1.73_m2} — ABNORMAL LOW (ref >=60)
Globulin: 2.7 g/dL (calc) (ref 1.9–3.7)
Glucose, Bld: 170 mg/dL — ABNORMAL HIGH (ref 65–139)
Potassium: 4.1 mmol/L (ref 3.5–5.3)
Sodium: 139 mmol/L (ref 135–146)
Total Bilirubin: 0.4 mg/dL (ref 0.2–1.2)
Total Protein: 7.1 g/dL (ref 6.1–8.1)

## 2020-10-10 LAB — CBC
HCT: 50 % — ABNORMAL HIGH (ref 35.0–45.0)
Hemoglobin: 15.9 g/dL — ABNORMAL HIGH (ref 11.7–15.5)
MCH: 26.9 pg — ABNORMAL LOW (ref 27.0–33.0)
MCHC: 31.8 g/dL — ABNORMAL LOW (ref 32.0–36.0)
MCV: 84.7 fL (ref 80.0–100.0)
MPV: 10.7 fL (ref 7.5–12.5)
Platelets: 253 10*3/uL (ref 140–400)
RBC: 5.9 10*6/uL — ABNORMAL HIGH (ref 3.80–5.10)
RDW: 14.2 % (ref 11.0–15.0)
WBC: 12.1 10*3/uL — ABNORMAL HIGH (ref 3.8–10.8)

## 2020-10-10 LAB — LIPID PANEL W/REFLEX DIRECT LDL
Cholesterol: 254 mg/dL — ABNORMAL HIGH (ref <200)
HDL: 31 mg/dL — ABNORMAL LOW (ref >=50)
Non-HDL Cholesterol (Calc): 223 mg/dL (calc) — ABNORMAL HIGH (ref <130)
Total CHOL/HDL Ratio: 8.2 (calc) — ABNORMAL HIGH (ref <5.0)
Triglycerides: 427 mg/dL — ABNORMAL HIGH (ref <150)

## 2020-10-10 LAB — DIRECT LDL: Direct LDL: 163 mg/dL — ABNORMAL HIGH (ref <100)

## 2020-10-10 LAB — HEMOGLOBIN A1C
Hgb A1c MFr Bld: 10.8 % of total Hgb — ABNORMAL HIGH (ref <5.7)
Mean Plasma Glucose: 263 mg/dL
eAG (mmol/L): 14.6 mmol/L

## 2020-10-10 LAB — TSH: TSH: 1.27 mIU/L

## 2020-10-12 ENCOUNTER — Other Ambulatory Visit: Payer: Self-pay

## 2020-10-12 DIAGNOSIS — D72829 Elevated white blood cell count, unspecified: Secondary | ICD-10-CM

## 2020-10-18 ENCOUNTER — Ambulatory Visit: Payer: Self-pay

## 2020-10-18 ENCOUNTER — Other Ambulatory Visit: Payer: Self-pay

## 2020-10-18 ENCOUNTER — Other Ambulatory Visit: Payer: Self-pay | Admitting: Family Medicine

## 2020-10-18 DIAGNOSIS — M79674 Pain in right toe(s): Secondary | ICD-10-CM

## 2020-11-06 ENCOUNTER — Other Ambulatory Visit: Payer: Self-pay | Admitting: Family Medicine

## 2020-11-06 ENCOUNTER — Other Ambulatory Visit: Payer: Self-pay

## 2020-11-06 ENCOUNTER — Ambulatory Visit (INDEPENDENT_AMBULATORY_CARE_PROVIDER_SITE_OTHER): Payer: BC Managed Care – PPO | Admitting: Family Medicine

## 2020-11-06 VITALS — BP 167/99 | HR 103 | Temp 98.8°F | Resp 20 | Ht 64.5 in | Wt 180.0 lb

## 2020-11-06 DIAGNOSIS — I10 Essential (primary) hypertension: Secondary | ICD-10-CM

## 2020-11-06 MED ORDER — LOSARTAN POTASSIUM 100 MG PO TABS: 100.0000 mg | ORAL_TABLET | Freq: Every day | ORAL | 3 refills | Status: DC

## 2020-11-06 MED ORDER — AMLODIPINE BESYLATE 5 MG PO TABS: 5.0000 mg | ORAL_TABLET | Freq: Every day | ORAL | 3 refills | Status: DC

## 2020-11-06 NOTE — Progress Notes (Signed)
Medical screening examination/treatment was performed by qualified clinical staff member and as supervising physician I was immediately available for consultation/collaboration. I have reviewed documentation and agree with assessment and plan.  Terrianna Holsclaw, DO  

## 2020-11-06 NOTE — Progress Notes (Signed)
Established Patient Office Visit  Subjective:  Patient ID: [name] Hurley, female    DOB: 09/03/1970  Age: 50 y.o. MRN: [id number]  CC:  Chief Complaint  Patient presents with  . Hypertension    HPI [name] Hurley presents for a BP check. First BP 186/103, Second BP 167/99.   Past Medical History:  Diagnosis Date  . Diabetes mellitus   . Dyspareunia   . Hypertension   . Sciatica     Past Surgical History:  Procedure Laterality Date  . CESAREAN SECTION    . paragard     removal 7-16 & reinsertion 02-13-15    Family History  Problem Relation Age of Onset  . Hypertension Father   . COPD Father   . Emphysema Father   . Diabetes Maternal Aunt   . Hypertension Mother   . Hyperlipidemia Mother   . Colon cancer Mother   . COPD Paternal Grandfather   . Hypertension Brother   . Heart disease Neg Hx     Social History   Socioeconomic History  . Marital status: Married    Spouse name: Not on file  . Number of children: 4  . Years of education: 71  . Highest education level: Not on file  Occupational History  . Occupation: Nutritition   Tobacco Use  . Smoking status: Current Every Day Smoker    Packs/day: 1.00    Years: 22.00    Pack years: 22.00    Types: Cigarettes  . Smokeless tobacco: Never Used  Substance and Sexual Activity  . Alcohol use: Not Currently  . Drug use: No  . Sexual activity: Yes    Partners: Male    Birth control/protection: I.U.D.  Other Topics Concern  . Not on file  Social History Narrative   Born and raised in [place]. Fun: Sleep   Denies any religious beliefs effecting health care.    Social Determinants of Health   Financial Resource Strain: Not on file  Food Insecurity: Not on file  Transportation Needs: Not on file  Physical Activity: Not on file  Stress: Not on file  Social Connections: Not on file  Intimate Partner Violence: Not on file    Outpatient Medications Prior to Visit  Medication Sig Dispense Refill  .  betamethasone valerate ointment (VALISONE) 0.1 % USE A PEA SIZED AMOUNT TOPICALLY TWICE DAILY FOR 2 WEEKS, CAN THEN USE TWICE A WEEK IF NEEDED 30 g 0  . empagliflozin (JARDIANCE) 10 MG TABS tablet Take 10 mg by mouth daily. 90 tablet 1  . Fluticasone Furoate-Vilanterol (BREO ELLIPTA) 100-25 MCG/INH AEPB Inhale 1 puff into the lungs daily as needed. 28 each 0  . lidocaine (XYLOCAINE) 5 % ointment Apply 1 application topically 4 (four) times daily as needed. 30 g 1  . medroxyPROGESTERone (PROVERA) 5 MG tablet Take one tablet a day for 5 days now and every other month if no spontaneous menses. 15 tablet 1  . metFORMIN (GLUCOPHAGE) 1000 MG tablet TAKE 1 TABLET BY MOUTH TWICE DAILY WITH A MEAL 180 tablet 2  . mupirocin ointment (BACTROBAN) 2 % Apply 1 application topically 2 (two) times daily. To leg 22 g 0  . ONETOUCH ULTRA test strip USE AS DIRECTED TWICE DAILY TO CHECK BLOOD SUGAR 100 each 0  . pravastatin (PRAVACHOL) 10 MG tablet Take 1 tablet (10 mg total) by mouth daily. 90 tablet 2  . Semaglutide,0.25 or 0.5MG /DOS, (OZEMPIC, 0.25 OR 0.5 MG/DOSE,) 2 MG/1.5ML SOPN Inject 0.375 mLs (0.5 mg total) into  the skin once a week. 3 pen 2  . terconazole (TERAZOL 7) 0.4 % vaginal cream Place 1 applicator vaginally at bedtime. 45 g 0  . valACYclovir (VALTREX) 500 MG tablet Take one tablet po BID x 3 days prn. 30 tablet 1  . losartan (COZAAR) 50 MG tablet Take 1 tablet (50 mg total) by mouth daily. 90 tablet 1   No facility-administered medications prior to visit.    No Known Allergies  ROS Review of Systems    Objective:    Physical Exam  BP (!) 167/99 (BP Location: Left Arm, Patient Position: Sitting, Cuff Size: Normal)   Pulse (!) 103   Temp 98.8 F (37.1 C) (Oral)   Resp 20   Ht 5' 4.5" (1.638 m)   Wt 180 lb (81.6 kg)   SpO2 98%   BMI 30.42 kg/m  Wt Readings from Last 3 Encounters:  11/06/20 180 lb (81.6 kg)  10/09/20 182 lb 12.8 oz (82.9 kg)  07/11/20 184 lb (83.5 kg)     Health  Maintenance Due  Topic Date Due  . HIV Screening  Never done  . OPHTHALMOLOGY EXAM  07/25/2020    There are no preventive care reminders to display for this patient.  Lab Results  Component Value Date   TSH 1.27 10/09/2020   Lab Results  Component Value Date   WBC 12.1 (H) 10/09/2020   HGB 15.9 (H) 10/09/2020   HCT 50.0 (H) 10/09/2020   MCV 84.7 10/09/2020   PLT 253 10/09/2020   Lab Results  Component Value Date   NA 139 10/09/2020   K 4.1 10/09/2020   CO2 27 10/09/2020   GLUCOSE 170 (H) 10/09/2020   BUN 20 10/09/2020   CREATININE 1.28 (H) 10/09/2020   BILITOT 0.4 10/09/2020   ALKPHOS 63 11/19/2017   AST 14 10/09/2020   ALT 8 10/09/2020   PROT 7.1 10/09/2020   ALBUMIN 3.9 11/19/2017   CALCIUM 10.1 10/09/2020   GFR 59.22 (L) 02/09/2019   Lab Results  Component Value Date   CHOL 254 (H) 10/09/2020   Lab Results  Component Value Date   HDL 31 (L) 10/09/2020   Lab Results  Component Value Date   [facility]  10/09/2020     Comment:     . LDL cholesterol not calculated. Triglyceride levels greater than 400 mg/dL invalidate calculated LDL results. . Reference range: <100 . Desirable range <100 mg/dL for primary prevention;   <70 mg/dL for patients with CHD or diabetic patients  with > or = 2 CHD risk factors. Marland Kitchen LDL-C is now calculated using the Martin-Hopkins  calculation, which is a validated novel method providing  better accuracy than the Friedewald equation in the  estimation of LDL-C.  Horald Pollen et al. Lenox Ahr. 4268;341(96): 2061-2068  (http://education.[facility].com/faq/FAQ164)    Lab Results  Component Value Date   TRIG 427 (H) 10/09/2020   Lab Results  Component Value Date   CHOLHDL 8.2 (H) 10/09/2020   Lab Results  Component Value Date   HGBA1C 10.8 (H) 10/09/2020      Assessment & Plan:  Continue to take losartan 100 MG. Start amlodipine 5MG . Follow up in 4 weeks for a BP check.  Problem List Items Addressed This Visit       Cardiovascular and Mediastinum   Essential hypertension - Primary      No orders of the defined types were placed in this encounter.   Follow-up: Return in about 4 weeks (around 12/04/2020) for BP Check.  Ninfa Meeker, CMA

## 2020-11-11 ENCOUNTER — Other Ambulatory Visit: Payer: Self-pay | Admitting: Family Medicine

## 2020-12-05 ENCOUNTER — Ambulatory Visit (INDEPENDENT_AMBULATORY_CARE_PROVIDER_SITE_OTHER): Payer: BC Managed Care – PPO | Admitting: Family Medicine

## 2020-12-05 ENCOUNTER — Other Ambulatory Visit: Payer: Self-pay

## 2020-12-05 VITALS — BP 182/89 | HR 91 | Temp 98.0°F | Resp 20 | Ht 64.5 in | Wt 181.0 lb

## 2020-12-05 DIAGNOSIS — I10 Essential (primary) hypertension: Secondary | ICD-10-CM

## 2020-12-05 NOTE — Progress Notes (Signed)
Medical screening examination/treatment was performed by qualified clinical staff member and as supervising physician I was immediately available for consultation/collaboration. I have reviewed documentation and agree with assessment and plan.  Reno Clasby, DO  

## 2020-12-05 NOTE — Progress Notes (Signed)
Established Patient Office Visit  Subjective:  Patient ID: [name] Hurley, female    DOB: 10/11/1970  Age: 50 y.o. MRN: [id number]  CC:  Chief Complaint  Patient presents with  . Hypertension    HPI [name] Hurley presents for a BP check. Pt taking medication as directed, denies missing doses. First BP 163/90. Second BP 182/89.  Past Medical History:  Diagnosis Date  . Diabetes mellitus   . Dyspareunia   . Hypertension   . Sciatica     Past Surgical History:  Procedure Laterality Date  . CESAREAN SECTION    . paragard     removal 7-16 & reinsertion 02-13-15    Family History  Problem Relation Age of Onset  . Hypertension Father   . COPD Father   . Emphysema Father   . Diabetes Maternal Aunt   . Hypertension Mother   . Hyperlipidemia Mother   . Colon cancer Mother   . COPD Paternal Grandfather   . Hypertension Brother   . Heart disease Neg Hx     Social History   Socioeconomic History  . Marital status: Married    Spouse name: Not on file  . Number of children: 4  . Years of education: 60  . Highest education level: Not on file  Occupational History  . Occupation: Nutritition   Tobacco Use  . Smoking status: Current Every Day Smoker    Packs/day: 1.00    Years: 22.00    Pack years: 22.00    Types: Cigarettes  . Smokeless tobacco: Never Used  Substance and Sexual Activity  . Alcohol use: Not Currently  . Drug use: No  . Sexual activity: Yes    Partners: Male    Birth control/protection: I.U.D.  Other Topics Concern  . Not on file  Social History Narrative   Born and raised in [place]. Fun: Sleep   Denies any religious beliefs effecting health care.    Social Determinants of Health   Financial Resource Strain: Not on file  Food Insecurity: Not on file  Transportation Needs: Not on file  Physical Activity: Not on file  Stress: Not on file  Social Connections: Not on file  Intimate Partner Violence: Not on file    Outpatient Medications Prior to  Visit  Medication Sig Dispense Refill  . amLODipine (NORVASC) 5 MG tablet Take 1 tablet (5 mg total) by mouth daily. 90 tablet 3  . betamethasone valerate ointment (VALISONE) 0.1 % USE A PEA SIZED AMOUNT TOPICALLY TWICE DAILY FOR 2 WEEKS, CAN THEN USE TWICE A WEEK IF NEEDED 30 g 0  . empagliflozin (JARDIANCE) 10 MG TABS tablet Take 10 mg by mouth daily. 90 tablet 1  . Fluticasone Furoate-Vilanterol (BREO ELLIPTA) 100-25 MCG/INH AEPB Inhale 1 puff into the lungs daily as needed. 28 each 0  . lidocaine (XYLOCAINE) 5 % ointment Apply 1 application topically 4 (four) times daily as needed. 30 g 1  . losartan (COZAAR) 100 MG tablet Take 1 tablet (100 mg total) by mouth daily. 90 tablet 3  . medroxyPROGESTERone (PROVERA) 5 MG tablet Take one tablet a day for 5 days now and every other month if no spontaneous menses. 15 tablet 1  . metFORMIN (GLUCOPHAGE) 1000 MG tablet TAKE 1 TABLET BY MOUTH TWICE DAILY WITH A MEAL 180 tablet 2  . mupirocin ointment (BACTROBAN) 2 % Apply 1 application topically 2 (two) times daily. To leg 22 g 0  . ONETOUCH ULTRA test strip USE AS DIRECTED TWICE DAILY TO  CHECK BLOOD SUGAR 100 each 0  . pravastatin (PRAVACHOL) 10 MG tablet Take 1 tablet (10 mg total) by mouth daily. 90 tablet 2  . Semaglutide,0.25 or 0.5MG /DOS, (OZEMPIC, 0.25 OR 0.5 MG/DOSE,) 2 MG/1.5ML SOPN Inject 0.375 mLs (0.5 mg total) into the skin once a week. 3 pen 2  . terconazole (TERAZOL 7) 0.4 % vaginal cream Place 1 applicator vaginally at bedtime. 45 g 0  . TRESIBA FLEXTOUCH 100 UNIT/ML FlexTouch Pen INJECT 0.25ML (25 UNITS) INTO THE SKIN DAILY 23 mL 0  . valACYclovir (VALTREX) 500 MG tablet Take one tablet po BID x 3 days prn. 30 tablet 1   No facility-administered medications prior to visit.    No Known Allergies  ROS Review of Systems    Objective:    Physical Exam  BP (!) 163/90 (BP Location: Left Arm, Patient Position: Sitting, Cuff Size: Normal)   Pulse 91   Temp 98 F (36.7 C) (Oral)    Resp 20   Ht 5' 4.5" (1.638 m)   Wt 181 lb (82.1 kg)   SpO2 99%   BMI 30.59 kg/m  Wt Readings from Last 3 Encounters:  12/05/20 181 lb (82.1 kg)  11/06/20 180 lb (81.6 kg)  10/09/20 182 lb 12.8 oz (82.9 kg)     Health Maintenance Due  Topic Date Due  . HIV Screening  Never done  . OPHTHALMOLOGY EXAM  07/25/2020    There are no preventive care reminders to display for this patient.  Lab Results  Component Value Date   TSH 1.27 10/09/2020   Lab Results  Component Value Date   WBC 12.1 (H) 10/09/2020   HGB 15.9 (H) 10/09/2020   HCT 50.0 (H) 10/09/2020   MCV 84.7 10/09/2020   PLT 253 10/09/2020   Lab Results  Component Value Date   NA 139 10/09/2020   K 4.1 10/09/2020   CO2 27 10/09/2020   GLUCOSE 170 (H) 10/09/2020   BUN 20 10/09/2020   CREATININE 1.28 (H) 10/09/2020   BILITOT 0.4 10/09/2020   ALKPHOS 63 11/19/2017   AST 14 10/09/2020   ALT 8 10/09/2020   PROT 7.1 10/09/2020   ALBUMIN 3.9 11/19/2017   CALCIUM 10.1 10/09/2020   GFR 59.22 (L) 02/09/2019   Lab Results  Component Value Date   CHOL 254 (H) 10/09/2020   Lab Results  Component Value Date   HDL 31 (L) 10/09/2020   Lab Results  Component Value Date   [facility]  10/09/2020     Comment:     . LDL cholesterol not calculated. Triglyceride levels greater than 400 mg/dL invalidate calculated LDL results. . Reference range: <100 . Desirable range <100 mg/dL for primary prevention;   <70 mg/dL for patients with CHD or diabetic patients  with > or = 2 CHD risk factors. Marland Kitchen LDL-C is now calculated using the Martin-Hopkins  calculation, which is a validated novel method providing  better accuracy than the Friedewald equation in the  estimation of LDL-C.  Horald Pollen et al. Lenox Ahr. 8242;353(61): 2061-2068  (http://education.[facility].com/faq/FAQ164)    Lab Results  Component Value Date   TRIG 427 (H) 10/09/2020   Lab Results  Component Value Date   CHOLHDL 8.2 (H) 10/09/2020   Lab  Results  Component Value Date   HGBA1C 10.8 (H) 10/09/2020      Assessment & Plan:  Continue current medications, monitor BP at home. Follow up at office visit scheduled for 02/07/2021.  Problem List Items Addressed This Visit  Cardiovascular and Mediastinum   Essential hypertension - Primary      No orders of the defined types were placed in this encounter.   Follow-up: Return in about 2 months (around 02/07/2021).    Kathrynn Speed, CMA

## 2020-12-27 ENCOUNTER — Other Ambulatory Visit: Payer: Self-pay | Admitting: Obstetrics and Gynecology

## 2020-12-29 ENCOUNTER — Other Ambulatory Visit: Payer: Self-pay | Admitting: Obstetrics and Gynecology

## 2020-12-30 MED ORDER — BETAMETHASONE VALERATE 0.1 % EX OINT: TOPICAL_OINTMENT | CUTANEOUS | 0 refills | Status: DC

## 2020-12-31 ENCOUNTER — Encounter: Payer: Self-pay | Admitting: Obstetrics and Gynecology

## 2020-12-31 NOTE — Telephone Encounter (Signed)
Call to patient. Message given to patient as seen below from Dr. Edward Jolly and patient verbalized understanding.   Encounter closed.

## 2020-12-31 NOTE — Telephone Encounter (Signed)
Medication refill request: Valtrex  Last AEX:  04-11-20 JJ  Next AEX: 04-15-21  Last MMG (if hormonal medication request): b/a Refill authorized: Today, please advise.   Medication pended for #30, 0RF. Please refill if appropriate.   Routing to covering provider.

## 2020-12-31 NOTE — Telephone Encounter (Signed)
Please have patient reduce her Valtrex to 500 mg by mouth daily x 3 days for an outbreak.   I noticed she has decreased renal function on chart review.  This is the reason to decrease her Valtrex dosing.

## 2021-01-21 ENCOUNTER — Other Ambulatory Visit: Payer: Self-pay | Admitting: Family Medicine

## 2021-02-07 ENCOUNTER — Ambulatory Visit: Payer: BC Managed Care – PPO | Admitting: Family Medicine

## 2021-02-07 ENCOUNTER — Other Ambulatory Visit: Payer: Self-pay

## 2021-02-07 ENCOUNTER — Encounter: Payer: Self-pay | Admitting: Family Medicine

## 2021-02-07 VITALS — BP 162/91 | HR 85 | Temp 98.0°F | Ht 64.5 in | Wt 184.0 lb

## 2021-02-07 DIAGNOSIS — E119 Type 2 diabetes mellitus without complications: Secondary | ICD-10-CM | POA: Diagnosis not present

## 2021-02-07 DIAGNOSIS — F17210 Nicotine dependence, cigarettes, uncomplicated: Secondary | ICD-10-CM

## 2021-02-07 DIAGNOSIS — I10 Essential (primary) hypertension: Secondary | ICD-10-CM | POA: Diagnosis not present

## 2021-02-07 DIAGNOSIS — F418 Other specified anxiety disorders: Secondary | ICD-10-CM | POA: Diagnosis not present

## 2021-02-07 DIAGNOSIS — H00011 Hordeolum externum right upper eyelid: Secondary | ICD-10-CM

## 2021-02-07 DIAGNOSIS — H00019 Hordeolum externum unspecified eye, unspecified eyelid: Secondary | ICD-10-CM | POA: Insufficient documentation

## 2021-02-07 DIAGNOSIS — Z794 Long term (current) use of insulin: Secondary | ICD-10-CM | POA: Diagnosis not present

## 2021-02-07 LAB — POCT GLYCOSYLATED HEMOGLOBIN (HGB A1C): HbA1c, POC (controlled diabetic range): 9.1 % — AB (ref 0.0–7.0)

## 2021-02-07 MED ORDER — ERYTHROMYCIN 5 MG/GM OP OINT: 1.0000 "application " | TOPICAL_OINTMENT | Freq: Every day | OPHTHALMIC | 0 refills | Status: AC

## 2021-02-07 MED ORDER — AMLODIPINE BESYLATE 10 MG PO TABS: 10.0000 mg | ORAL_TABLET | Freq: Every day | ORAL | 2 refills | Status: DC

## 2021-02-07 MED ORDER — ALBUTEROL SULFATE HFA 108 (90 BASE) MCG/ACT IN AERS: 2.0000 | INHALATION_SPRAY | Freq: Four times a day (QID) | RESPIRATORY_TRACT | 0 refills | Status: AC | PRN

## 2021-02-07 NOTE — Assessment & Plan Note (Signed)
Diabetes is better controlled today than at last visit.  I think adding Ozempic back on will certainly be helpful for her as far as blood sugar control and weight loss.  We can titrate this as needed.  I encouraged her to work on dietary changes as well.

## 2021-02-07 NOTE — Assessment & Plan Note (Signed)
She is counseled on smoking cessation.  She does have some mild wheezing on exam today.  I have sent over an albuterol inhaler for her to use as needed.

## 2021-02-07 NOTE — Progress Notes (Signed)
[name] Hurley - 50 y.o. female MRN [id number]  Date of birth: 09-25-1970  Subjective Chief Complaint  Patient presents with   Diabetes    HPI [name] Hurley is a 50 year old female here today for a follow-up visit.  She has a history of type 2 diabetes, hypertension, nicotine dependence and anxiety.  She does report being under more stress recently.  She and her husband did recently purchase a new home however she is not very happy with it.  She also has some swelling and pain above her right eye.  Pain and swelling above her right eye began approximately 1 month ago.  It does improve some however then returned.  There is a small pimple-like area on the inside of the upper lid.  She is using warm compresses but only for a couple of minutes at a time.  She does have an appointment with her eye doctor but this is not for several weeks.  Her diabetes is currently managed with a combination of of Jardiance, Ozempic, metformin and [place].  She has not been using the Ozempic for the past few weeks.  She is taking all other medications as directed.  She does report that her blood sugars have been elevated at home.  She is concerned that her A1c is higher than last time.  She denies any symptoms related to her diabetes at this time.  She is taking pravastatin for associated hyperlipidemia.  Tolerating this well.  Hypertension is managed with amlodipine 5 mg and losartan 100 mg.  She is tolerating medications well however she is not checking blood pressure at home.  She denies chest pain, shortness of breath, palpitations, headache or vision changes.    ROS:  A comprehensive ROS was completed and negative except as noted per HPI  No Known Allergies  Past Medical History:  Diagnosis Date   Diabetes mellitus    Dyspareunia    Elevated serum creatinine    Hypertension    Sciatica     Past Surgical History:  Procedure Laterality Date   CESAREAN SECTION     paragard     removal 7-16 & reinsertion  02-13-15    Social History   Socioeconomic History   Marital status: Married    Spouse name: Not on file   Number of children: 4   Years of education: 14   Highest education level: Not on file  Occupational History   Occupation: Nutritition   Tobacco Use   Smoking status: Every Day    Packs/day: 1.00    Years: 22.00    Pack years: 22.00    Types: Cigarettes   Smokeless tobacco: Never  Substance and Sexual Activity   Alcohol use: Not Currently   Drug use: No   Sexual activity: Yes    Partners: Male    Birth control/protection: I.U.D.  Other Topics Concern   Not on file  Social History Narrative   Born and raised in [place]. Fun: Sleep   Denies any religious beliefs effecting health care.    Social Determinants of Health   Financial Resource Strain: Not on file  Food Insecurity: Not on file  Transportation Needs: Not on file  Physical Activity: Not on file  Stress: Not on file  Social Connections: Not on file    Family History  Problem Relation Age of Onset   Hypertension Father    COPD Father    Emphysema Father    Diabetes Maternal Aunt    Hypertension Mother    Hyperlipidemia  Mother    Colon cancer Mother    COPD Paternal Grandfather    Hypertension Brother    Heart disease Neg Hx     Health Maintenance  Topic Date Due   HIV Screening  Never done   Pneumococcal Vaccine 66-23 Years old (2 - PCV) 11/20/2018   OPHTHALMOLOGY EXAM  07/25/2020   COVID-19 Vaccine (4 - Booster for Pfizer series) 09/14/2020   FOOT EXAM  02/19/2021   INFLUENZA VACCINE  02/25/2021   HEMOGLOBIN A1C  08/10/2021   MAMMOGRAM  10/12/2021   Fecal DNA (Cologuard)  03/08/2023   PAP SMEAR-Modifier  04/12/2023   TETANUS/TDAP  02/19/2030   PNEUMOCOCCAL POLYSACCHARIDE VACCINE AGE 2-64 HIGH RISK  Completed   Hepatitis C Screening  Completed   Zoster Vaccines- Shingrix  Completed   HPV VACCINES  Aged Out      ----------------------------------------------------------------------------------------------------------------------------------------------------------------------------------------------------------------- Physical Exam BP (!) 162/91 (BP Location: Left Arm, Patient Position: Sitting, Cuff Size: Normal)   Pulse 85   Temp 98 F (36.7 C)   Ht 5' 4.5" (1.638 m)   Wt 184 lb (83.5 kg)   SpO2 96%   BMI 31.10 kg/m   Physical Exam Constitutional:      Appearance: Normal appearance.  HENT:     Head: Normocephalic and atraumatic.  Eyes:     General: No scleral icterus. Cardiovascular:     Rate and Rhythm: Normal rate and regular rhythm.  Pulmonary:     Effort: Pulmonary effort is normal.     Breath sounds: Wheezing present.  Musculoskeletal:     Cervical back: Neck supple.  Neurological:     General: No focal deficit present.     Mental Status: She is alert.  Psychiatric:        Mood and Affect: Mood normal.        Behavior: Behavior normal.    ------------------------------------------------------------------------------------------------------------------------------------------------------------------------------------------------------------------- Assessment and Plan  Cigarette nicotine dependence without complication She is counseled on smoking cessation.  She does have some mild wheezing on exam today.  I have sent over an albuterol inhaler for her to use as needed.  Anxiety with depression She does report some increased anxiety recently however does not want to restart any medication at this time.  She is continuing to see a therapist regularly.  Type 2 diabetes mellitus without complication, with long-term current use of insulin (HCC) Diabetes is better controlled today than at last visit.  I think adding Ozempic back on will certainly be helpful for her as far as blood sugar control and weight loss.  We can titrate this as needed.  I encouraged her to work on  dietary changes as well.  Essential hypertension Blood pressure is elevated today.  She will continue losartan at current strength and I will have her increase her amlodipine to 10 mg daily.  Low-sodium diet discussed.  Hordeolum Encouraged to warm compresses for 10 to 15 minutes at a time multiple times per day.  We will add erythromycin ointment as well.  She does have a upcoming visit with her eye doctor.   Meds ordered this encounter  Medications   amLODipine (NORVASC) 10 MG tablet    Sig: Take 1 tablet (10 mg total) by mouth daily.    Dispense:  90 tablet    Refill:  2   erythromycin ophthalmic ointment    Sig: Place 1 application into the right eye at bedtime.    Dispense:  3.5 g    Refill:  0    Return  in about 3 months (around 05/10/2021) for HTN/T2DM.    This visit occurred during the SARS-CoV-2 public health emergency.  Safety protocols were in place, including screening questions prior to the visit, additional usage of staff PPE, and extensive cleaning of exam room while observing appropriate contact time as indicated for disinfecting solutions.

## 2021-02-07 NOTE — Assessment & Plan Note (Signed)
She does report some increased anxiety recently however does not want to restart any medication at this time.  She is continuing to see a therapist regularly.

## 2021-02-07 NOTE — Patient Instructions (Signed)
Great to see you today! Increase amlodipine to 10mg  daily.  Restart Ozempic Start erythyromycin ointment to eye.  See me again in 3 months.   Chalazion  A chalazion is a swelling or lump on the eyelid. It can affect the upper eyelidor the lower eyelid. What are the causes? This condition may be caused by: Long-lasting (chronic) inflammation of the eyelid glands. A blocked oil gland in the eyelid. What are the signs or symptoms? Symptoms of this condition include: Swelling of the eyelid. The swelling may spread to areas around the eye. A hard lump on the eyelid. Blurry vision. The lump on the eyelid may make it hard to see out of the eye. How is this diagnosed? This condition is diagnosed with an examination of the eye. How is this treated? This condition is treated by applying a warm compress to the eyelid. If the condition does not improve, it may be treated with: Medicine that is injected into the chalazion by a health care provider. Surgery. Medicine that is applied to the eye. Follow these instructions at home: Managing pain and swelling Apply a warm, moist compress to the eyelid 4-6 times a day for 10-15 minutes at a time. This will help to open any blocked glands and to reduce redness and swelling. Apply over-the-counter and prescription medicines only as told by your health care provider. General instructions Do not touch the chalazion. Do not try to remove the pus. Do not squeeze the chalazion or stick it with a pin or needle. Do not rub your eyes. Wash your hands often. Dry your hands with a clean towel. Keep your face, scalp, and eyebrows clean. Avoid wearing eye makeup. If the chalazion does not break open (rupture) on its own, return to your health care provider. Keep all follow-up appointments as told by your health care provider. This is important. Contact a health care provider if: Your eyelid has not improved in 4 weeks. Your eyelid is getting worse. You have  a fever. The chalazion does not rupture on its own after a month of home treatment. Get help right away if: You have pain in your eye. Your vision changes. The chalazion becomes painful or red. The chalazion gets bigger. Summary A chalazion is a swelling or lump on the upper or lower eyelid. It may be caused by chronic inflammation or a blocked oil gland. Apply a warm, moist compress to the eyelid 4-6 times a day for 10-15 minutes at a time. Keep your face, scalp, and eyebrows clean. This information is not intended to replace advice given to you by your health care provider. Make sure you discuss any questions you have with your healthcare provider. Document Revised: 12/31/2017 Document Reviewed: 12/31/2017 Elsevier Patient Education  2022 2023.

## 2021-02-07 NOTE — Assessment & Plan Note (Addendum)
Encouraged to warm compresses for 10 to 15 minutes at a time multiple times per day.  We will add erythromycin ointment as well.  She does have a upcoming visit with her eye doctor.

## 2021-02-07 NOTE — Assessment & Plan Note (Signed)
Blood pressure is elevated today.  She will continue losartan at current strength and I will have her increase her amlodipine to 10 mg daily.  Low-sodium diet discussed.

## 2021-02-22 ENCOUNTER — Other Ambulatory Visit: Payer: Self-pay | Admitting: Obstetrics and Gynecology

## 2021-02-22 MED ORDER — BETAMETHASONE VALERATE 0.1 % EX OINT: TOPICAL_OINTMENT | CUTANEOUS | 0 refills | Status: DC

## 2021-02-22 MED ORDER — VALACYCLOVIR HCL 500 MG PO TABS: ORAL_TABLET | ORAL | 0 refills | Status: DC

## 2021-02-22 NOTE — Telephone Encounter (Signed)
Annual exam due in Sept.

## 2021-03-15 ENCOUNTER — Other Ambulatory Visit: Payer: Self-pay | Admitting: Family Medicine

## 2021-03-27 LAB — HM DIABETES EYE EXAM

## 2021-04-12 NOTE — Progress Notes (Signed)
50 y.o. [id number] Married Black or [place] American Not Hispanic or Latino female here for annual exam. Patient reports that she has not had an actually had a period in about a year. She has taken the provera and only got spotting one time last October.   She has a paragard IUD, placed in 2016. No cycle since October. She was taking the provera every other month and would just spot. Last took the provera in June.   Sexually active, sometimes she has pain on entry.   She has urge incontinence, leaks daily, small amounts.    H/O DM, last HgbA1C was 9.1 in 7/22.  She has HSV, she has been having frequent outbreaks. Taking it cyclically.   No LMP recorded. (Menstrual status: IUD).     Last updated cycle was June on 2021     Sexually active: Yes.    The current method of family planning is IUD.   Paragaurd placed 02/13/15  Exercising: No.  The patient does not participate in regular exercise at present. Smoker:  yes  Health Maintenance: Pap:  04/11/20 WNL Hr HPV neg  02/10/17 WNL  History of abnormal Pap:  no MMG:  10/14/19 Bi-rads 1 neg  BMD:   none  Colonoscopy: Cologuard 03/07/20 negative. 2005 neg per patient  TDaP:  02/20/20  Gardasil: na   reports that she has been smoking cigarettes. She has a 22.00 pack-year smoking history. She has never used smokeless tobacco. She reports that she does not currently use alcohol. She reports that she does not use drugs. 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 1 biologic and 3 adopted kids. 27, 22,19, 15. Lucila Maine will be 1 next week, granddaughter is 23 days old. Everyone is living with her (including the mother of her granddaughter, 61 year olds partner).   Past Medical History:  Diagnosis Date   Diabetes mellitus    Dyspareunia    Elevated serum creatinine    Hypertension    Sciatica     Past Surgical History:  Procedure Laterality Date   CESAREAN SECTION     paragard     removal 7-16 & reinsertion 02-13-15    Current Outpatient  Medications  Medication Sig Dispense Refill   albuterol (VENTOLIN HFA) 108 (90 Base) MCG/ACT inhaler Inhale 2 puffs into the lungs every 6 (six) hours as needed for wheezing or shortness of breath. 8 g 0   amLODipine (NORVASC) 10 MG tablet Take 1 tablet (10 mg total) by mouth daily. 90 tablet 2   betamethasone valerate ointment (VALISONE) 0.1 % USE A PEA SIZED AMOUNT TOPICALLY TWICE DAILY FOR 2 WEEKS, CAN THEN USE TWICE A WEEK IF NEEDED 30 g 0   erythromycin ophthalmic ointment Place 1 application into the right eye at bedtime. 3.5 g 0   Fluticasone Furoate-Vilanterol (BREO ELLIPTA) 100-25 MCG/INH AEPB Inhale 1 puff into the lungs daily as needed. 28 each 0   JARDIANCE 10 MG TABS tablet Take 1 tablet by mouth once daily 90 tablet 0   lidocaine (XYLOCAINE) 5 % ointment Apply 1 application topically 4 (four) times daily as needed. 30 g 1   losartan (COZAAR) 100 MG tablet Take 1 tablet (100 mg total) by mouth daily. 90 tablet 3   metFORMIN (GLUCOPHAGE) 1000 MG tablet TAKE 1 TABLET BY MOUTH TWICE DAILY WITH A MEAL 180 tablet 2   mupirocin ointment (BACTROBAN) 2 % Apply 1 application topically 2 (two) times daily. To leg 22 g 0   ONETOUCH ULTRA  test strip USE AS DIRECTED TWICE DAILY TO CHECK BLOOD SUGAR 100 each 0   pravastatin (PRAVACHOL) 10 MG tablet Take 1 tablet (10 mg total) by mouth daily. 90 tablet 2   Semaglutide,0.25 or 0.5MG /DOS, (OZEMPIC, 0.25 OR 0.5 MG/DOSE,) 2 MG/1.5ML SOPN Inject 0.375 mLs (0.5 mg total) into the skin once a week. 3 pen 2   terconazole (TERAZOL 7) 0.4 % vaginal cream Place 1 applicator vaginally at bedtime. 45 g 0   TRESIBA FLEXTOUCH 100 UNIT/ML FlexTouch Pen INJECT 0.25 MLS (25 UNITS) INTO THE SKIN DAILY 15 mL 0   medroxyPROGESTERone (PROVERA) 5 MG tablet Take one tablet a day for 5 days every other month until you go 6 months without bleeding. 15 tablet 1   valACYclovir (VALTREX) 500 MG tablet TAKE 1 TABLET BY MOUTH ONCE DAILY, increase to one tablet po BID for 3 days  with an outbreak. 90 tablet 4   No current facility-administered medications for this visit.  Valtrex suppression and provera scripts written today.   Family History  Problem Relation Age of Onset   Hypertension Father    COPD Father    Emphysema Father    Diabetes Maternal Aunt    Hypertension Mother    Hyperlipidemia Mother    Colon cancer Mother    COPD Paternal Grandfather    Hypertension Brother    Heart disease Neg Hx     Review of Systems  Genitourinary:  Positive for vaginal discharge and vaginal pain.  All other systems reviewed and are negative.  Exam:   BP (!) 150/82   Pulse 88   Ht 5\' 4"  (1.626 m)   Wt 178 lb (80.7 kg)   SpO2 100%   BMI 30.55 kg/m   Weight change: @WEIGHTCHANGE @ Height:   Height: 5\' 4"  (162.6 cm)  Ht Readings from Last 3 Encounters:  04/15/21 5\' 4"  (1.626 m)  02/07/21 5' 4.5" (1.638 m)  12/05/20 5' 4.5" (1.638 m)    General appearance: alert, cooperative and appears stated age Head: Normocephalic, without obvious abnormality, atraumatic Neck: no adenopathy, supple, symmetrical, trachea midline and thyroid normal to inspection and palpation Lungs: clear to auscultation bilaterally Cardiovascular: regular rate and rhythm Breasts: normal appearance, no masses or tenderness Abdomen: soft, non-tender; non distended,  no masses,  no organomegaly Extremities: extremities normal, atraumatic, no cyanosis or edema Skin: Skin color, texture, turgor normal. No rashes or lesions Lymph nodes: Cervical, supraclavicular, and axillary nodes normal. No abnormal inguinal nodes palpated Neurologic: Grossly normal   Pelvic: External genitalia:  diffusely erythematous, whitening              Urethra:  normal appearing urethra with no masses, tenderness or lesions              Bartholins and Skenes: normal                 Vagina: normal appearing vagina with normal color and discharge, no lesions              Cervix: no lesions and IUD string 2 cm                Bimanual Exam:  Uterus:   no masses or tenderness              Adnexa: no mass, fullness, tenderness               Rectovaginal: Confirms               Anus:  normal sphincter tone, no lesions  Carolynn Serve chaperoned for the exam.  1. Well woman exam Discussed breast self exam Discussed calcium and vit D intake Mammogram due Pap and cologuard UTD  2. 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 Current dose okay with her GFR.  - valACYclovir (VALTREX) 500 MG tablet; TAKE 1 TABLET BY MOUTH ONCE DAILY, increase to one tablet po BID for 3 days with an outbreak.  Dispense: 90 tablet; Refill: 4  3. Perimenopause - medroxyPROGESTERone (PROVERA) 5 MG tablet; Take one tablet a day for 5 days every other month until you go 6 months without bleeding.  Dispense: 15 tablet; Refill: 1 (okay with GFR)  4. Urge incontinence - oxybutynin (DITROPAN XL) 5 MG 24 hr tablet; Take 1 tablet (5 mg total) by mouth at bedtime.  Dispense: 30 tablet; Refill: 1. Dosing okay with her GFR - Urinalysis,Complete w/RFL Culture -F/U in one month  5. Vulvovaginitis Significant vulvitis, possible dermatitis, Urinary incontinence could be contributing.  - WET PREP FOR TRICH, YEAST, CLUE - betamethasone valerate ointment (VALISONE) 0.1 %; USE A PEA SIZED AMOUNT TOPICALLY TWICE DAILY FOR 2 WEEKS, CAN THEN USE TWICE A WEEK IF NEEDED  Dispense: 30 g; Refill: 0 - SureSwab Bacterial Vaginosis/itis -Vulvar skin care reviewed and information was given

## 2021-04-15 ENCOUNTER — Other Ambulatory Visit: Payer: Self-pay

## 2021-04-15 ENCOUNTER — Encounter: Payer: Self-pay | Admitting: Obstetrics and Gynecology

## 2021-04-15 ENCOUNTER — Ambulatory Visit (INDEPENDENT_AMBULATORY_CARE_PROVIDER_SITE_OTHER): Payer: BC Managed Care – PPO | Admitting: Obstetrics and Gynecology

## 2021-04-15 VITALS — BP 150/82 | HR 88 | Ht 64.0 in | Wt 178.0 lb

## 2021-04-15 DIAGNOSIS — N3941 Urge incontinence: Secondary | ICD-10-CM | POA: Diagnosis not present

## 2021-04-15 DIAGNOSIS — N760 Acute vaginitis: Secondary | ICD-10-CM

## 2021-04-15 DIAGNOSIS — A6000 Herpesviral infection of urogenital system, unspecified: Secondary | ICD-10-CM | POA: Diagnosis not present

## 2021-04-15 DIAGNOSIS — Z01419 Encounter for gynecological examination (general) (routine) without abnormal findings: Secondary | ICD-10-CM | POA: Diagnosis not present

## 2021-04-15 DIAGNOSIS — N951 Menopausal and female climacteric states: Secondary | ICD-10-CM | POA: Diagnosis not present

## 2021-04-15 LAB — WET PREP FOR TRICH, YEAST, CLUE

## 2021-04-15 MED ORDER — VALACYCLOVIR HCL 500 MG PO TABS: ORAL_TABLET | ORAL | 4 refills | Status: DC

## 2021-04-15 MED ORDER — OXYBUTYNIN CHLORIDE ER 5 MG PO TB24: 5.0000 mg | ORAL_TABLET | Freq: Every day | ORAL | 1 refills | Status: DC

## 2021-04-15 MED ORDER — MEDROXYPROGESTERONE ACETATE 5 MG PO TABS: ORAL_TABLET | ORAL | 1 refills | Status: DC

## 2021-04-15 MED ORDER — BETAMETHASONE VALERATE 0.1 % EX OINT: TOPICAL_OINTMENT | CUTANEOUS | 0 refills | Status: DC

## 2021-04-15 NOTE — Patient Instructions (Addendum)
Try uberlube for vaginal lubrication.   PLEASE SCHEDULE YOUR MAMMOGRAM!  EXERCISE   We recommended that you start or continue a regular exercise program for good health. Physical activity is anything that gets your body moving, some is better than none. The CDC recommends 150 minutes per week of Moderate-Intensity Aerobic Activity and 2 or more days of Muscle Strengthening Activity.  Benefits of exercise are limitless: helps weight loss/weight maintenance, improves mood and energy, helps with depression and anxiety, improves sleep, tones and strengthens muscles, improves balance, improves bone density, protects from chronic conditions such as heart disease, high blood pressure and diabetes and so much more. To learn more visit: http://kirby-bean.org/  DIET: Good nutrition starts with a healthy diet of fruits, vegetables, whole grains, and lean protein sources. Drink plenty of water for hydration. Minimize empty calories, sodium, sweets. For more information about dietary recommendations visit: CriticalGas.be and https://www.carpenter-henry.info/  ALCOHOL:  Women should limit their alcohol intake to no more than 7 drinks/beers/glasses of wine (combined, not each!) per week. Moderation of alcohol intake to this level decreases your risk of breast cancer and liver damage.  If you are concerned that you may have a problem, or your friends have told you they are concerned about your drinking, there are many resources to help. A well-known program that is free, effective, and available to all people all over the nation is Alcoholics Anonymous.  Check out this site to learn more: BeverageBargains.co.za   CALCIUM AND VITAMIN D:  Adequate intake of calcium and Vitamin D are recommended for bone health.  You should be getting between 1000-1200 mg of calcium and 800 units of Vitamin D daily between diet and supplements  PAP SMEARS:  Pap  smears, to check for cervical cancer or precancers,  have traditionally been done yearly, scientific advances have shown that most women can have pap smears less often.  However, every woman still should have a physical exam from her gynecologist every year. It will include a breast check, inspection of the vulva and vagina to check for abnormal growths or skin changes, a visual exam of the cervix, and then an exam to evaluate the size and shape of the uterus and ovaries. We will also provide age appropriate advice regarding health maintenance, like when you should have certain vaccines, screening for sexually transmitted diseases, bone density testing, colonoscopy, mammograms, etc.   MAMMOGRAMS:  All women over 76 years old should have a routine mammogram.   COLON CANCER SCREENING: Now recommend starting at age 14. At this time colonoscopy is not covered for routine screening until 50. There are take home tests that can be done between 45-49.   COLONOSCOPY:  Colonoscopy to screen for colon cancer is recommended for all women at age 4.  We know, you hate the idea of the prep.  We agree, BUT, having colon cancer and not knowing it is worse!!  Colon cancer so often starts as a polyp that can be seen and removed at colonscopy, which can quite literally save your life!  And if your first colonoscopy is normal and you have no family history of colon cancer, most women don't have to have it again for 10 years.  Once every ten years, you can do something that may end up saving your life, right?  We will be happy to help you get it scheduled when you are ready.  Be sure to check your insurance coverage so you understand how much it will cost.  It may be  covered as a preventative service at no cost, but you should check your particular policy.      Breast Self-Awareness Breast self-awareness means being familiar with how your breasts look and feel. It involves checking your breasts regularly and reporting any  changes to your health care provider. Practicing breast self-awareness is important. A change in your breasts can be a sign of a serious medical problem. Being familiar with how your breasts look and feel allows you to find any problems early, when treatment is more likely to be successful. All women should practice breast self-awareness, including women who have had breast implants. How to do a breast self-exam One way to learn what is normal for your breasts and whether your breasts are changing is to do a breast self-exam. To do a breast self-exam: Look for Changes  Remove all the clothing above your waist. Stand in front of a mirror in a room with good lighting. Put your hands on your hips. Push your hands firmly downward. Compare your breasts in the mirror. Look for differences between them (asymmetry), such as: Differences in shape. Differences in size. Puckers, dips, and bumps in one breast and not the other. Look at each breast for changes in your skin, such as: Redness. Scaly areas. Look for changes in your nipples, such as: Discharge. Bleeding. Dimpling. Redness. A change in position. Feel for Changes Carefully feel your breasts for lumps and changes. It is best to do this while lying on your back on the floor and again while sitting or standing in the shower or tub with soapy water on your skin. Feel each breast in the following way: Place the arm on the side of the breast you are examining above your head. Feel your breast with the other hand. Start in the nipple area and make  inch (2 cm) overlapping circles to feel your breast. Use the pads of your three middle fingers to do this. Apply light pressure, then medium pressure, then firm pressure. The light pressure will allow you to feel the tissue closest to the skin. The medium pressure will allow you to feel the tissue that is a little deeper. The firm pressure will allow you to feel the tissue close to the ribs. Continue  the overlapping circles, moving downward over the breast until you feel your ribs below your breast. Move one finger-width toward the center of the body. Continue to use the  inch (2 cm) overlapping circles to feel your breast as you move slowly up toward your collarbone. Continue the up and down exam using all three pressures until you reach your armpit.  Write Down What You Find  Write down what is normal for each breast and any changes that you find. Keep a written record with breast changes or normal findings for each breast. By writing this information down, you do not need to depend only on memory for size, tenderness, or location. Write down where you are in your menstrual cycle, if you are still menstruating. If you are having trouble noticing differences in your breasts, do not get discouraged. With time you will become more familiar with the variations in your breasts and more comfortable with the exam. How often should I examine my breasts? Examine your breasts every month. If you are breastfeeding, the best time to examine your breasts is after a feeding or after using a breast pump. If you menstruate, the best time to examine your breasts is 5-7 days after your period is over.  During your period, your breasts are lumpier, and it may be more difficult to notice changes. When should I see my health care provider? See your health care provider if you notice: A change in shape or size of your breasts or nipples. A change in the skin of your breast or nipples, such as a reddened or scaly area. Unusual discharge from your nipples. A lump or thick area that was not there before. Pain in your breasts. Anything that concerns you.

## 2021-04-17 ENCOUNTER — Other Ambulatory Visit: Payer: Self-pay

## 2021-04-17 DIAGNOSIS — R3129 Other microscopic hematuria: Secondary | ICD-10-CM

## 2021-04-17 LAB — SURESWAB® ADVANCED VAGINITIS,TMA
CANDIDA SPECIES: DETECTED — AB
Candida glabrata: NOT DETECTED
SURESWAB(R) ADV BACTERIAL VAGINOSIS(BV),TMA: NEGATIVE
TRICHOMONAS VAGINALIS (TV),TMA: NOT DETECTED

## 2021-04-17 LAB — URINALYSIS, COMPLETE W/RFL CULTURE
Bilirubin Urine: NEGATIVE
Casts: NONE SEEN /LPF
Crystals: NONE SEEN /HPF
Hyaline Cast: NONE SEEN /LPF
Ketones, ur: NEGATIVE
Nitrites, Initial: NEGATIVE
Specific Gravity, Urine: 1.025 (ref 1.001–1.035)
pH: 5.5 (ref 5.0–8.0)

## 2021-04-17 LAB — URINE CULTURE
MICRO NUMBER:: 12391495
SPECIMEN QUALITY:: ADEQUATE

## 2021-04-17 LAB — CULTURE INDICATED

## 2021-04-17 MED ORDER — FLUCONAZOLE 150 MG PO TABS: ORAL_TABLET | ORAL | 0 refills | Status: DC

## 2021-04-18 ENCOUNTER — Other Ambulatory Visit: Payer: Self-pay

## 2021-04-18 ENCOUNTER — Ambulatory Visit (INDEPENDENT_AMBULATORY_CARE_PROVIDER_SITE_OTHER): Payer: BC Managed Care – PPO | Admitting: *Deleted

## 2021-04-18 VITALS — BP 130/80 | HR 98 | Resp 16

## 2021-04-18 DIAGNOSIS — R3129 Other microscopic hematuria: Secondary | ICD-10-CM | POA: Diagnosis not present

## 2021-04-18 LAB — URINALYSIS, MICROSCOPIC ONLY
Casts: NONE SEEN /LPF
Crystals: NONE SEEN /HPF
Hyaline Cast: NONE SEEN /LPF

## 2021-04-18 NOTE — Progress Notes (Signed)
Patient in office for I&O cath for microscopic hematuria. Under sterile technique, patient straight cathed for urine. Urine pale and yellow. Specimens sent to lab for processing. Patient tolerated well. Advised to hydrate well following catheterization and call office if she develops urinary symptoms or fever. Patient agreeable. Patient advised results would take 2-3 business days for processing.   Routing to provider and will close encounter.

## 2021-04-19 LAB — URINE CULTURE
MICRO NUMBER:: 12410060
Result:: NO GROWTH
SPECIMEN QUALITY:: ADEQUATE

## 2021-04-23 ENCOUNTER — Encounter: Payer: Self-pay | Admitting: Family Medicine

## 2021-04-30 ENCOUNTER — Encounter (HOSPITAL_COMMUNITY): Payer: Self-pay | Admitting: Emergency Medicine

## 2021-04-30 ENCOUNTER — Ambulatory Visit (HOSPITAL_COMMUNITY)
Admission: EM | Admit: 2021-04-30 | Discharge: 2021-04-30 | Disposition: A | Discharge status: Home / Self Care | Payer: BC Managed Care – PPO | Attending: Student | Admitting: Student

## 2021-04-30 ENCOUNTER — Other Ambulatory Visit: Payer: Self-pay

## 2021-04-30 DIAGNOSIS — S91312A Laceration without foreign body, left foot, initial encounter: Secondary | ICD-10-CM

## 2021-04-30 DIAGNOSIS — E11628 Type 2 diabetes mellitus with other skin complications: Secondary | ICD-10-CM | POA: Diagnosis not present

## 2021-04-30 DIAGNOSIS — E1165 Type 2 diabetes mellitus with hyperglycemia: Secondary | ICD-10-CM | POA: Diagnosis not present

## 2021-04-30 DIAGNOSIS — Z7984 Long term (current) use of oral hypoglycemic drugs: Secondary | ICD-10-CM | POA: Diagnosis not present

## 2021-04-30 MED ORDER — DOXYCYCLINE HYCLATE 100 MG PO CAPS: 100.0000 mg | ORAL_CAPSULE | Freq: Two times a day (BID) | ORAL | 0 refills | Status: AC

## 2021-04-30 NOTE — ED Provider Notes (Signed)
[facility]    CSN: [id number] Arrival date & time: 04/30/21  0808      History   Chief Complaint Chief Complaint  Patient presents with   Laceration    Lt foot     HPI [name] Hurley is a 50 y.o. female presenting with left foot laceration.  Medical history diabetes.  Describes laceration for 1 week, following hitting her foot on a screen door.  Significant pain surrounding the laceration, initially with white discharge but no longer.  Has been washing and applying Neosporin at home, and using gauze.  She does have a podiatrist but has not followed with them for this.  Denies fever/chills.  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   Essential hypertension 11/19/2017   Dyslipidemia 09/19/2015   Muscle cramp 10/17/2014   Type 2 diabetes mellitus without complication, with long-term current use of insulin (HCC) 11/24/2013    Past Surgical History:  Procedure Laterality Date   CESAREAN SECTION     paragard     removal 7-16 & reinsertion 02-13-15    OB History     Gravida  1   Para  1   Term  1   Preterm      AB      Living  1      SAB      IAB      Ectopic      Multiple      Live Births  1        Obstetric Comments  Also 3 adopted children (2 boys and 1 girl)          Home Medications    Prior to Admission medications   Medication Sig Start Date End Date Taking? Authorizing Provider  doxycycline (VIBRAMYCIN) 100 MG capsule Take 1 capsule (100 mg total) by mouth 2 (two) times daily for 7 days. 04/30/21 05/07/21 Yes Rhys Martini, PA-C   albuterol (VENTOLIN HFA) 108 (90 Base) MCG/ACT inhaler Inhale 2 puffs into the lungs every 6 (six) hours as needed for wheezing or shortness of breath. 02/07/21   Everrett Coombe, DO  amLODipine (NORVASC) 10 MG tablet Take 1 tablet (10 mg total) by mouth daily. 02/07/21 05/08/21  Everrett Coombe, DO  betamethasone valerate ointment (VALISONE) 0.1 % USE A PEA SIZED AMOUNT TOPICALLY TWICE DAILY FOR 2 WEEKS, CAN THEN USE TWICE A WEEK IF NEEDED 04/15/21   Romualdo Bolk, MD  erythromycin ophthalmic ointment Place 1 application into the right eye at bedtime. 02/07/21   Everrett Coombe, DO  fluconazole (DIFLUCAN) 150 MG tablet Take one tab po. Repeat in 72 hours if symptoms persist. 04/17/21   Romualdo Bolk, MD  Fluticasone Furoate-Vilanterol (BREO ELLIPTA) 100-25 MCG/INH AEPB Inhale 1 puff into the lungs daily as needed. 04/25/15   Veryl Speak, FNP  JARDIANCE 10 MG TABS tablet Take 1 tablet by mouth once daily 01/21/21   Everrett Coombe, DO  lidocaine (XYLOCAINE) 5 % ointment Apply 1 application topically 4 (four) times daily as needed. 06/29/20   Clarita Crane,  NP  losartan (COZAAR) 100 MG tablet Take 1 tablet (100 mg total) by mouth daily. 11/06/20   Everrett Coombe, DO  medroxyPROGESTERone (PROVERA) 5 MG tablet Take one tablet a day for 5 days every other month until you go 6 months without bleeding. 04/15/21   Romualdo Bolk, MD  metFORMIN (GLUCOPHAGE) 1000 MG tablet TAKE 1 TABLET BY MOUTH TWICE DAILY WITH A MEAL 07/11/20   Everrett Coombe, DO  mupirocin ointment (BACTROBAN) 2 % Apply 1 application topically 2 (two) times daily. To leg 02/09/19   Everrett Coombe, DO  [facility] ULTRA test strip USE AS DIRECTED TWICE DAILY TO CHECK BLOOD SUGAR 09/26/19   Mliss Sax, MD  oxybutynin (DITROPAN XL) 5 MG 24 hr tablet Take 1 tablet (5 mg total) by mouth at bedtime. 04/15/21   Romualdo Bolk, MD  pravastatin (PRAVACHOL) 10 MG tablet Take 1 tablet (10 mg total) by mouth daily. 07/11/20    Everrett Coombe, DO  Semaglutide,0.25 or 0.5MG /DOS, (OZEMPIC, 0.25 OR 0.5 MG/DOSE,) 2 MG/1.5ML SOPN Inject 0.375 mLs (0.5 mg total) into the skin once a week. 02/20/20   Everrett Coombe, DO  terconazole (TERAZOL 7) 0.4 % vaginal cream Place 1 applicator vaginally at bedtime. 06/29/20   Clarita Crane, NP  TRESIBA FLEXTOUCH 100 UNIT/ML FlexTouch Pen INJECT 0.25 MLS (25 UNITS) INTO THE SKIN DAILY 03/18/21   Everrett Coombe, DO  valACYclovir (VALTREX) 500 MG tablet TAKE 1 TABLET BY MOUTH ONCE DAILY, increase to one tablet po BID for 3 days with an outbreak. 04/15/21   Romualdo Bolk, MD    Family History Family History  Problem Relation Age of Onset   Hypertension Father    COPD Father    Emphysema Father    Diabetes Maternal Aunt    Hypertension Mother    Hyperlipidemia Mother    Colon cancer Mother    COPD Paternal Grandfather    Hypertension Brother    Heart disease Neg Hx     Social History Social History   Tobacco Use   Smoking status: Every Day    Packs/day: 1.00    Years: 22.00    Pack years: 22.00    Types: Cigarettes   Smokeless tobacco: Never  Substance Use Topics   Alcohol use: Not Currently   Drug use: No     Allergies   Patient has no known allergies.   Review of Systems Review of Systems  Skin:  Positive for wound.  All other systems reviewed and are negative.   Physical Exam Triage Vital Signs ED Triage Vitals  Enc Vitals Group     BP 04/30/21 0833 (!) 169/89     Pulse Rate 04/30/21 0833 97     Resp 04/30/21 0833 17     Temp 04/30/21 0833 98 F (36.7 C)     Temp src --      SpO2 04/30/21 0833 97 %     Weight --      Height --      Head Circumference --      Peak Flow --      Pain Score 04/30/21 0831 3     Pain Loc --      Pain Edu? --      Excl. in GC? --    No data found.  Updated Vital Signs BP (!) 169/89   Pulse 97   Temp 98 F (36.7 C)   Resp 17   SpO2 97%   Visual Acuity Right Eye Distance:  Left Eye Distance:   Bilateral  Distance:    Right Eye Near:   Left Eye Near:    Bilateral Near:     Physical Exam Vitals reviewed.  Constitutional:      General: She is not in acute distress.    Appearance: Normal appearance. She is not ill-appearing or diaphoretic.  HENT:     Head: Normocephalic and atraumatic.  Cardiovascular:     Rate and Rhythm: Normal rate and regular rhythm.     Heart sounds: Normal heart sounds.  Pulmonary:     Effort: Pulmonary effort is normal.     Breath sounds: Normal breath sounds.  Skin:    General: Skin is warm.     Comments: See image below L outer foot with 1cm laceration with surrounding erythema and tenderness. No ulceration or discharge.   Neurological:     General: No focal deficit present.     Mental Status: She is alert and oriented to person, place, and time.  Psychiatric:        Mood and Affect: Mood normal.        Behavior: Behavior normal.        Thought Content: Thought content normal.        Judgment: Judgment normal.       UC Treatments / Results  Labs (all labs ordered are listed, but only abnormal results are displayed) Labs Reviewed - No data to display  EKG   Radiology No results found.  Procedures Procedures (including critical care time)  Medications Ordered in UC Medications - No data to display  Initial Impression / Assessment and Plan / UC Course  I have reviewed the triage vital signs and the nursing notes.  Pertinent labs & imaging results that were available during my care of the patient were reviewed by me and considered in my medical decision making (see chart for details).     This patient is a very pleasant 50 y.o. year old female presenting with L foot laceration x1 week. Afebrile, nontachy. I do have concern for early cellulitis given increasing pain and redness, and concurrent poorly controlled diabetes.   A1c 9.1 01/2021. Continue current regimen. Tdap UTD 01/2020.  Doxycycline sent. Follow-up with podiatry, she already  has a podiatrist. Wound care provided.   ED return precautions discussed. Patient verbalizes understanding and agreement.     Final Clinical Impressions(s) / UC Diagnoses   Final diagnoses:  Laceration of left foot, initial encounter  Type 2 diabetes mellitus with other skin complication, without long-term current use of insulin (HCC)     Discharge Instructions      -Doxycycline twice daily for 7 days.  Make sure to wear sunscreen while spending time outside while on this medication as it can increase your chance of sunburn. You can take this medication with food if you have a sensitive stomach. -Follow-up with podiatrist for recheck and/or supportive footwear -Continue washing with gentle soap and water, neosporin, wrapping     ED Prescriptions     Medication Sig Dispense Auth. Provider   doxycycline (VIBRAMYCIN) 100 MG capsule Take 1 capsule (100 mg total) by mouth 2 (two) times daily for 7 days. 14 capsule Rhys Martini, PA-C      PDMP not reviewed this encounter.   Rhys Martini, PA-C 04/30/21 [phone]

## 2021-04-30 NOTE — ED Triage Notes (Addendum)
Pt is present today with a laceration on her left foot. Pt states that she cut her foot on her screen door on monday . Pt states that she is a diabetic and feels her laceration is not healing properly.

## 2021-04-30 NOTE — Discharge Instructions (Addendum)
-  Doxycycline twice daily for 7 days.  Make sure to wear sunscreen while spending time outside while on this medication as it can increase your chance of sunburn. You can take this medication with food if you have a sensitive stomach. -Follow-up with podiatrist for recheck and/or supportive footwear -Continue washing with gentle soap and water, neosporin, wrapping

## 2021-05-07 ENCOUNTER — Telehealth: Payer: Self-pay

## 2021-05-07 NOTE — Telephone Encounter (Signed)
PT reached out through online forum with questions regarding 360 program and enrollment. Scheduled to meet with Dr. Irving Shows in [place] to explore services and approach further. Confirmation sent for in person consult, see below;    From: Adriana Simas   Sent: Tuesday, May 07, 2021 2:01 PM  To: '[name]' [email or web address]>  Subject: RE: Re: [facility] - Information Requested    Tammy Larson, [name] you are all set!    Confirmation for your consultation may be found below, if you have any questions please let us know.       We have you confirmed for a complimentary consult on Wednesday, October 19th @ 3:30pm with Dr. Lyndel Pleasure in our [facility] [place] office located at: [address], [place], [place] [address].       Please bring a government-issued picture ID and your insurance card with you. In order to ensure a smooth check-in process and to minimize the handling of forms, please utilize MyChart to access your account and complete the required registration paperwork via e-check in, in advance of your visit. We encourage you to contact our offices with any questions surrounding these forms and any items not completed can/will be obtained on site at the time of your visit. Access to MyChart FAQ's and assistance.      Important special instructions for the day of your appointment:  Please call our office at [phone] from your car once arrived and parked to check-in for your visit. Our staff will then inform you to enter the suite.   We are limiting visitors. Only those visitors whose clinical participation is absolutely required may be permitted to accompany patients into the clinic.   Patients and visitors are required to wear a mask/face cover into the building or must wear one provided by our staff. Any patients wearing gloves will be asked to remove them upon entering the suite. If patient prefers to wear gloves during their visit a clean pair will be provided to them.       COVID ALERT: Please be aware that we are taking special precautions during this pandemic in order to minimize the spread of covid-19 to our patients and team members. Please note the special instructions here. We thank you in advance for your cooperation.      Our [place] office is looking forward to meeting you!   If you have any questions or concerns please feel free to contact me directly or reach out to our [place] team at [phone].     Best,     Adriana Simas, Patient Relations Manager, [facility] [phone]

## 2021-05-13 ENCOUNTER — Encounter: Payer: Self-pay | Admitting: Family Medicine

## 2021-05-13 ENCOUNTER — Ambulatory Visit (INDEPENDENT_AMBULATORY_CARE_PROVIDER_SITE_OTHER): Payer: BC Managed Care – PPO | Admitting: Family Medicine

## 2021-05-13 DIAGNOSIS — Z20822 Contact with and (suspected) exposure to covid-19: Secondary | ICD-10-CM | POA: Diagnosis not present

## 2021-05-13 DIAGNOSIS — I10 Essential (primary) hypertension: Secondary | ICD-10-CM

## 2021-05-13 DIAGNOSIS — Z794 Long term (current) use of insulin: Secondary | ICD-10-CM

## 2021-05-13 DIAGNOSIS — E785 Hyperlipidemia, unspecified: Secondary | ICD-10-CM

## 2021-05-13 DIAGNOSIS — Z23 Encounter for immunization: Secondary | ICD-10-CM | POA: Diagnosis not present

## 2021-05-13 DIAGNOSIS — E119 Type 2 diabetes mellitus without complications: Secondary | ICD-10-CM | POA: Diagnosis not present

## 2021-05-13 LAB — POCT GLYCOSYLATED HEMOGLOBIN (HGB A1C): HbA1c, POC (controlled diabetic range): 8.8 % — AB (ref 0.0–7.0)

## 2021-05-13 MED ORDER — SEMAGLUTIDE (1 MG/DOSE) 4 MG/3ML ~~LOC~~ SOPN: 1.0000 mg | PEN_INJECTOR | SUBCUTANEOUS | 1 refills | Status: DC

## 2021-05-13 NOTE — Patient Instructions (Signed)
Great to see you today! Let's increase Ozempic to 1mg  weekly.  See me again in about 4 months.

## 2021-05-14 LAB — NOVEL CORONAVIRUS, NAA: SARS-CoV-2, NAA: NOT DETECTED

## 2021-05-14 LAB — SARS-COV-2, NAA 2 DAY TAT

## 2021-05-14 LAB — COMPLETE METABOLIC PANEL WITH GFR
AG Ratio: 1.6 (calc) (ref 1.0–2.5)
ALT: 6 U/L (ref 6–29)
AST: 11 U/L (ref 10–35)
Albumin: 4.3 g/dL (ref 3.6–5.1)
Alkaline phosphatase (APISO): 89 U/L (ref 37–153)
BUN/Creatinine Ratio: 13 (calc) (ref 6–22)
BUN: 15 mg/dL (ref 7–25)
CO2: 24 mmol/L (ref 20–32)
Calcium: 9.6 mg/dL (ref 8.6–10.4)
Chloride: 105 mmol/L (ref 98–110)
Creat: 1.15 mg/dL — ABNORMAL HIGH (ref 0.50–1.03)
Globulin: 2.7 g/dL (calc) (ref 1.9–3.7)
Glucose, Bld: 96 mg/dL (ref 65–99)
Potassium: 4.1 mmol/L (ref 3.5–5.3)
Sodium: 141 mmol/L (ref 135–146)
Total Bilirubin: 0.3 mg/dL (ref 0.2–1.2)
Total Protein: 7 g/dL (ref 6.1–8.1)
eGFR: 57 mL/min/{1.73_m2} — ABNORMAL LOW (ref >=60)

## 2021-05-14 LAB — CBC WITH DIFFERENTIAL/PLATELET
Absolute Monocytes: 741 cells/uL (ref 200–950)
Basophils Absolute: 76 cells/uL (ref 0–200)
Basophils Relative: 0.7 %
Eosinophils Absolute: 654 cells/uL — ABNORMAL HIGH (ref 15–500)
Eosinophils Relative: 6 %
HCT: 43.7 % (ref 35.0–45.0)
Hemoglobin: 13.6 g/dL (ref 11.7–15.5)
Lymphs Abs: 3335 cells/uL (ref 850–3900)
MCH: 26.5 pg — ABNORMAL LOW (ref 27.0–33.0)
MCHC: 31.1 g/dL — ABNORMAL LOW (ref 32.0–36.0)
MCV: 85 fL (ref 80.0–100.0)
MPV: 10.4 fL (ref 7.5–12.5)
Monocytes Relative: 6.8 %
Neutro Abs: 6093 cells/uL (ref 1500–7800)
Neutrophils Relative %: 55.9 %
Platelets: 256 10*3/uL (ref 140–400)
RBC: 5.14 10*6/uL — ABNORMAL HIGH (ref 3.80–5.10)
RDW: 14.5 % (ref 11.0–15.0)
Total Lymphocyte: 30.6 %
WBC: 10.9 10*3/uL — ABNORMAL HIGH (ref 3.8–10.8)

## 2021-05-14 LAB — LIPID PANEL W/REFLEX DIRECT LDL
Cholesterol: 181 mg/dL (ref <200)
HDL: 36 mg/dL — ABNORMAL LOW (ref >=50)
LDL Cholesterol (Calc): 103 mg/dL (calc) — ABNORMAL HIGH
Non-HDL Cholesterol (Calc): 145 mg/dL (calc) — ABNORMAL HIGH (ref <130)
Total CHOL/HDL Ratio: 5 (calc) — ABNORMAL HIGH (ref <5.0)
Triglycerides: 291 mg/dL — ABNORMAL HIGH (ref <150)

## 2021-05-15 ENCOUNTER — Institutional Professional Consult (permissible substitution): Payer: Self-pay | Admitting: Family Medicine

## 2021-05-19 DIAGNOSIS — Z20822 Contact with and (suspected) exposure to covid-19: Secondary | ICD-10-CM | POA: Insufficient documentation

## 2021-05-19 NOTE — Assessment & Plan Note (Signed)
Recent COVID exposure.  No symptoms at this time.  PCR test ordered.

## 2021-05-19 NOTE — Progress Notes (Signed)
[name] Hurley - 50 y.o. female MRN [id number]  Date of birth: 1970/12/30  Subjective Chief Complaint  Patient presents with   Diabetes    HPI [name] Hurley is a 50 year old female here today for follow-up visit.  She reports that overall she is feeling well.  She is due for updated labs today.  She is doing well with current medications for management of her diabetes, hypertension and hyperlipidemia.  She has not noted any significant side effects.  She is checking her blood sugars at home and these have improved compared to her previous readings.  She continues to work on dietary change.  Her blood pressure readings at home have been fairly well controlled as well.  She denies chest pain, shortness of breath, palpitations, headache or vision changes.  She does report that a family member that lives with her recently tested positive for COVID.  She would like to have PCR testing.  Exposure was approximately 3 to 4 days ago.  She is not having symptoms at this time. No Known Allergies  Past Medical History:  Diagnosis Date   Diabetes mellitus    Dyspareunia    Elevated serum creatinine    Hypertension    Sciatica     Past Surgical History:  Procedure Laterality Date   CESAREAN SECTION     paragard     removal 7-16 & reinsertion 02-13-15    Social History   Socioeconomic History   Marital status: Married    Spouse name: Not on file   Number of children: 4   Years of education: 14   Highest education level: Not on file  Occupational History   Occupation: Nutritition   Tobacco Use   Smoking status: Every Day    Packs/day: 1.00    Years: 22.00    Pack years: 22.00    Types: Cigarettes   Smokeless tobacco: Never  Substance and Sexual Activity   Alcohol use: Not Currently   Drug use: No   Sexual activity: Yes    Partners: Male    Birth control/protection: I.U.D.  Other Topics Concern   Not on file  Social History Narrative   Born and raised in [place]. Fun: Sleep   Denies any  religious beliefs effecting health care.    Social Determinants of Health   Financial Resource Strain: Not on file  Food Insecurity: Not on file  Transportation Needs: Not on file  Physical Activity: Not on file  Stress: Not on file  Social Connections: Not on file    Family History  Problem Relation Age of Onset   Hypertension Father    COPD Father    Emphysema Father    Diabetes Maternal Aunt    Hypertension Mother    Hyperlipidemia Mother    Colon cancer Mother    COPD Paternal Grandfather    Hypertension Brother    Heart disease Neg Hx     Health Maintenance  Topic Date Due   HIV Screening  Never done   Pneumococcal Vaccine 47-42 Years old (2 - PCV) 11/20/2018   COVID-19 Vaccine (4 - Booster for Pfizer series) 07/09/2020   OPHTHALMOLOGY EXAM  07/25/2020   MAMMOGRAM  10/12/2021   HEMOGLOBIN A1C  11/11/2021   FOOT EXAM  05/13/2022   Fecal DNA (Cologuard)  03/08/2023   PAP SMEAR-Modifier  04/12/2023   TETANUS/TDAP  02/19/2030   INFLUENZA VACCINE  Completed   Hepatitis C Screening  Completed   Zoster Vaccines- Shingrix  Completed   HPV VACCINES  Aged Out     ----------------------------------------------------------------------------------------------------------------------------------------------------------------------------------------------------------------- Physical Exam BP (!) 149/82 (BP Location: Left Arm, Patient Position: Sitting, Cuff Size: Normal)   Pulse 91   Ht 5' 4.5" (1.638 m)   Wt 174 lb (78.9 kg)   SpO2 98%   BMI 29.41 kg/m   Physical Exam Constitutional:      Appearance: Normal appearance.  HENT:     Head: Normocephalic and atraumatic.  Eyes:     General: No scleral icterus. Cardiovascular:     Rate and Rhythm: Normal rate and regular rhythm.  Pulmonary:     Effort: Pulmonary effort is normal.     Breath sounds: Normal breath sounds.  Musculoskeletal:     Cervical back: Neck supple.  Neurological:     General: No focal deficit  present.     Mental Status: She is alert.  Psychiatric:        Mood and Affect: Mood normal.        Behavior: Behavior normal.    ------------------------------------------------------------------------------------------------------------------------------------------------------------------------------------------------------------------- Assessment and Plan  Essential hypertension Blood pressure elevated today.  Readings at home have been better.  She will continue to monitor at home.  Continue current medications.  Low-sodium diet advised.  Type 2 diabetes mellitus without complication, with long-term current use of insulin (HCC) Most recent A1c of  Lab Results  Component Value Date   HGBA1C 8.8 (A) 05/13/2021  Control of her diabetes has improved however she is not quite at goal.  We will increase Ozempic to 1 mg weekly.  She will continue other medications as prescribed.   Dyslipidemia She continues on pravastatin for management of hyperlipidemia.  She continues to tolerate this well.  Updated lipid panel ordered.  Exposure to COVID-19 virus Recent COVID exposure.  No symptoms at this time.  PCR test ordered.   Meds ordered this encounter  Medications   Semaglutide, 1 MG/DOSE, 4 MG/3ML SOPN    Sig: Inject 1 mg as directed once a week.    Dispense:  12 mL    Refill:  1    Return in about 4 months (around 09/13/2021) for DM/HTN.    This visit occurred during the SARS-CoV-2 public health emergency.  Safety protocols were in place, including screening questions prior to the visit, additional usage of staff PPE, and extensive cleaning of exam room while observing appropriate contact time as indicated for disinfecting solutions.

## 2021-05-19 NOTE — Assessment & Plan Note (Signed)
Most recent A1c of  Lab Results  Component Value Date   HGBA1C 8.8 (A) 05/13/2021  Control of her diabetes has improved however she is not quite at goal.  We will increase Ozempic to 1 mg weekly.  She will continue other medications as prescribed.

## 2021-05-19 NOTE — Assessment & Plan Note (Signed)
Blood pressure elevated today.  Readings at home have been better.  She will continue to monitor at home.  Continue current medications.  Low-sodium diet advised.

## 2021-05-19 NOTE — Assessment & Plan Note (Signed)
She continues on pravastatin for management of hyperlipidemia.  She continues to tolerate this well.  Updated lipid panel ordered.

## 2021-05-20 ENCOUNTER — Encounter: Payer: Self-pay | Admitting: Family Medicine

## 2021-05-20 ENCOUNTER — Other Ambulatory Visit: Payer: Self-pay

## 2021-05-20 ENCOUNTER — Ambulatory Visit (INDEPENDENT_AMBULATORY_CARE_PROVIDER_SITE_OTHER): Payer: BC Managed Care – PPO | Admitting: Obstetrics and Gynecology

## 2021-05-20 ENCOUNTER — Encounter: Payer: Self-pay | Admitting: Obstetrics and Gynecology

## 2021-05-20 VITALS — BP 132/68 | HR 93 | Ht 64.0 in | Wt 175.0 lb

## 2021-05-20 DIAGNOSIS — N3941 Urge incontinence: Secondary | ICD-10-CM | POA: Diagnosis not present

## 2021-05-20 DIAGNOSIS — N951 Menopausal and female climacteric states: Secondary | ICD-10-CM

## 2021-05-20 MED ORDER — OXYBUTYNIN CHLORIDE ER 5 MG PO TB24: 5.0000 mg | ORAL_TABLET | Freq: Every day | ORAL | 3 refills | Status: DC

## 2021-05-20 NOTE — Progress Notes (Signed)
GYNECOLOGY  VISIT   HPI: 50 y.o.   Married Black or [place] American Not Hispanic or Latino  female   G1P1001 with No LMP recorded. (Menstrual status: IUD).   here for  urge incontinence and vulvitis following starting ditropan Patient states that she is doing better with ditropan. Patient states that she took the provera did not bring her period. Her vulva pain comes and goes. The past two weeks it has been much better while using Vaseline.   At the time of her annual exam last month she was treated for a yeast vulvitis and started on ditropan for urge incontinence. The yeast resolved. Vaseline is keeping her vulva comfortable.  She is doing great on the ditropan, no leakage and side effects.   She was also given a refill for provera to take every other month until she goes 6 months without a cycle. No cycle after taking the provera in 9/22.   GYNECOLOGIC HISTORY: No LMP recorded. (Menstrual status: IUD). Contraception:IUD Paragaurd placed 02/13/15 Menopausal hormone therapy: none         OB History     Gravida  1   Para  1   Term  1   Preterm      AB      Living  1      SAB      IAB      Ectopic      Multiple      Live Births  1        Obstetric Comments  Also 3 adopted children (2 boys and 1 girl)            Patient Active Problem List   Diagnosis Date Noted   Exposure to COVID-19 virus 05/19/2021   Hordeolum 02/07/2021   Anxiety with depression 09/06/2019   Open wound of toe 02/09/2019   Bursitis of left hip 09/29/2018   Cigarette nicotine dependence without complication 11/19/2017   Essential hypertension 11/19/2017   Dyslipidemia 09/19/2015   Muscle cramp 10/17/2014   Type 2 diabetes mellitus without complication, with long-term current use of insulin (HCC) 11/24/2013    Past Medical History:  Diagnosis Date   Diabetes mellitus    Dyspareunia    Elevated serum creatinine    Hypertension    Sciatica     Past Surgical History:  Procedure  Laterality Date   CESAREAN SECTION     paragard     removal 7-16 & reinsertion 02-13-15    Current Outpatient Medications  Medication Sig Dispense Refill   albuterol (VENTOLIN HFA) 108 (90 Base) MCG/ACT inhaler Inhale 2 puffs into the lungs every 6 (six) hours as needed for wheezing or shortness of breath. 8 g 0   betamethasone valerate ointment (VALISONE) 0.1 % USE A PEA SIZED AMOUNT TOPICALLY TWICE DAILY FOR 2 WEEKS, CAN THEN USE TWICE A WEEK IF NEEDED 30 g 0   erythromycin ophthalmic ointment Place 1 application into the right eye at bedtime. 3.5 g 0   Fluticasone Furoate-Vilanterol (BREO ELLIPTA) 100-25 MCG/INH AEPB Inhale 1 puff into the lungs daily as needed. 28 each 0   JARDIANCE 10 MG TABS tablet Take 1 tablet by mouth once daily 90 tablet 0   lidocaine (XYLOCAINE) 5 % ointment Apply 1 application topically 4 (four) times daily as needed. 30 g 1   losartan (COZAAR) 100 MG tablet Take 1 tablet (100 mg total) by mouth daily. 90 tablet 3   medroxyPROGESTERone (PROVERA) 5 MG tablet Take one tablet  a day for 5 days every other month until you go 6 months without bleeding. 15 tablet 1   metFORMIN (GLUCOPHAGE) 1000 MG tablet TAKE 1 TABLET BY MOUTH TWICE DAILY WITH A MEAL 180 tablet 2   mupirocin ointment (BACTROBAN) 2 % Apply 1 application topically 2 (two) times daily. To leg 22 g 0   ONETOUCH ULTRA test strip USE AS DIRECTED TWICE DAILY TO CHECK BLOOD SUGAR 100 each 0   oxybutynin (DITROPAN XL) 5 MG 24 hr tablet Take 1 tablet (5 mg total) by mouth at bedtime. 30 tablet 1   pravastatin (PRAVACHOL) 10 MG tablet Take 1 tablet (10 mg total) by mouth daily. 90 tablet 2   Semaglutide, 1 MG/DOSE, 4 MG/3ML SOPN Inject 1 mg as directed once a week. 12 mL 1   terconazole (TERAZOL 7) 0.4 % vaginal cream Place 1 applicator vaginally at bedtime. 45 g 0   TRESIBA FLEXTOUCH 100 UNIT/ML FlexTouch Pen INJECT 0.25 MLS (25 UNITS) INTO THE SKIN DAILY 15 mL 0   valACYclovir (VALTREX) 500 MG tablet TAKE 1 TABLET  BY MOUTH ONCE DAILY, increase to one tablet po BID for 3 days with an outbreak. 90 tablet 4   amLODipine (NORVASC) 10 MG tablet Take 1 tablet (10 mg total) by mouth daily. 90 tablet 2   No current facility-administered medications for this visit.     ALLERGIES: Patient has no known allergies.  Family History  Problem Relation Age of Onset   Hypertension Father    COPD Father    Emphysema Father    Diabetes Maternal Aunt    Hypertension Mother    Hyperlipidemia Mother    Colon cancer Mother    COPD Paternal Grandfather    Hypertension Brother    Heart disease Neg Hx     Social History   Socioeconomic History   Marital status: Married    Spouse name: Not on file   Number of children: 4   Years of education: 14   Highest education level: Not on file  Occupational History   Occupation: Nutritition   Tobacco Use   Smoking status: Every Day    Packs/day: 1.00    Years: 22.00    Pack years: 22.00    Types: Cigarettes   Smokeless tobacco: Never  Substance and Sexual Activity   Alcohol use: Not Currently   Drug use: No   Sexual activity: Yes    Partners: Male    Birth control/protection: I.U.D.  Other Topics Concern   Not on file  Social History Narrative   Born and raised in [place]. Fun: Sleep   Denies any religious beliefs effecting health care.    Social Determinants of Health   Financial Resource Strain: Not on file  Food Insecurity: Not on file  Transportation Needs: Not on file  Physical Activity: Not on file  Stress: Not on file  Social Connections: Not on file  Intimate Partner Violence: Not on file    Review of Systems  All other systems reviewed and are negative.  PHYSICAL EXAMINATION:    BP 132/68   Pulse 93   Ht 5\' 4"  (1.626 m)   Wt 175 lb (79.4 kg)   SpO2 98%   BMI 30.04 kg/m     General appearance: alert, cooperative and appears stated age  2. Urge incontinence Doing great with the ditropan - oxybutynin (DITROPAN XL) 5 MG 24 hr tablet;  Take 1 tablet (5 mg total) by mouth at bedtime.  Dispense: 90 tablet;  Refill: 3  2. Perimenopause Will keep taking the provera every other month until she goes 6 months without a cycle.

## 2021-05-20 NOTE — Progress Notes (Signed)
Tammy Larson is a 50 y.o. female presents to discuss potentially transferring Care to [facility].     I discussed the rationale of concierge medicine, our policies, and approach to delivery of medical care. I shared my experience and background and my emphasis on Preventative Medicine.  Tammy Larson shared her expectations and concerns with respect to her medical care. She does live in [place].  Should Tammy Larson decide to transition her care based on her needs and expectations, she will return for a PHYSICAL EXAMINATION appointment to discuss her concerns/history in more detail     Dr. Debbrah Alar Janee Morn MD,MPH

## 2021-05-25 ENCOUNTER — Other Ambulatory Visit: Payer: Self-pay | Admitting: Family Medicine

## 2021-07-05 ENCOUNTER — Other Ambulatory Visit: Payer: Self-pay | Admitting: Family Medicine

## 2021-07-12 ENCOUNTER — Encounter: Payer: Self-pay | Admitting: Family Medicine

## 2021-07-12 ENCOUNTER — Ambulatory Visit (INDEPENDENT_AMBULATORY_CARE_PROVIDER_SITE_OTHER): Payer: Commercial Managed Care - POS | Admitting: Family Medicine

## 2021-07-12 VITALS — BP 187/111 | HR 68 | Temp 98.4°F | Resp 14 | Wt 178.6 lb

## 2021-07-12 DIAGNOSIS — J069 Acute upper respiratory infection, unspecified: Secondary | ICD-10-CM

## 2021-07-12 DIAGNOSIS — N951 Menopausal and female climacteric states: Secondary | ICD-10-CM

## 2021-07-12 DIAGNOSIS — Z7689 Persons encountering health services in other specified circumstances: Secondary | ICD-10-CM

## 2021-07-12 DIAGNOSIS — Z1231 Encounter for screening mammogram for malignant neoplasm of breast: Secondary | ICD-10-CM

## 2021-07-12 DIAGNOSIS — E569 Vitamin deficiency, unspecified: Secondary | ICD-10-CM

## 2021-07-12 DIAGNOSIS — R030 Elevated blood-pressure reading, without diagnosis of hypertension: Secondary | ICD-10-CM

## 2021-07-12 DIAGNOSIS — Z0000 Encounter for general adult medical examination without abnormal findings: Secondary | ICD-10-CM

## 2021-07-12 DIAGNOSIS — R140 Abdominal distension (gaseous): Secondary | ICD-10-CM

## 2021-07-12 LAB — CBC AND DIFFERENTIAL
Absolute NRBC: 0 10*3/uL (ref 0.00–0.00)
Basophils Absolute Automated: 0.05 10*3/uL (ref 0.00–0.08)
Basophils Automated: 1 %
Eosinophils Absolute Automated: 0.06 10*3/uL (ref 0.00–0.44)
Eosinophils Automated: 1.2 %
Hematocrit: 37.6 % (ref 34.7–43.7)
Hgb: 12.5 g/dL (ref 11.4–14.8)
Immature Granulocytes Absolute: 0.01 10*3/uL (ref 0.00–0.07)
Immature Granulocytes: 0.2 %
Lymphocytes Absolute Automated: 1.98 10*3/uL (ref 0.42–3.22)
Lymphocytes Automated: 41.1 %
MCH: 31.7 pg (ref 25.1–33.5)
MCHC: 33.2 g/dL (ref 31.5–35.8)
MCV: 95.4 fL (ref 78.0–96.0)
MPV: 9.9 fL (ref 8.9–12.5)
Monocytes Absolute Automated: 0.33 10*3/uL (ref 0.21–0.85)
Monocytes: 6.8 %
Neutrophils Absolute: 2.39 10*3/uL (ref 1.10–6.33)
Neutrophils: 49.7 %
Nucleated RBC: 0 /100 WBC (ref 0.0–0.0)
Platelets: 328 10*3/uL (ref 142–346)
RBC: 3.94 10*6/uL (ref 3.90–5.10)
RDW: 12 % (ref 11–15)
WBC: 4.82 10*3/uL (ref 3.10–9.50)

## 2021-07-12 LAB — GFR: EGFR: >60

## 2021-07-12 LAB — LIPID PANEL
Cholesterol / HDL Ratio: 3.5 Index
Cholesterol: 214 mg/dL — ABNORMAL HIGH (ref 0–199)
HDL: 62 mg/dL (ref 40–9999)
LDL Calculated: 142 mg/dL — ABNORMAL HIGH (ref 0–99)
Triglycerides: 51 mg/dL (ref 34–149)
VLDL Calculated: 10 mg/dL (ref 10–40)

## 2021-07-12 LAB — COMPREHENSIVE METABOLIC PANEL
ALT: 21 U/L (ref 0–55)
AST (SGOT): 23 U/L (ref 5–41)
Albumin/Globulin Ratio: 1.3 (ref 0.9–2.2)
Albumin: 4.4 g/dL (ref 3.5–5.0)
Alkaline Phosphatase: 70 U/L (ref 37–117)
Anion Gap: 7 (ref 5.0–15.0)
BUN: 12 mg/dL (ref 7.0–21.0)
Bilirubin, Total: 0.5 mg/dL (ref 0.2–1.2)
CO2: 30 mEq/L — ABNORMAL HIGH (ref 17–29)
Calcium: 10.1 mg/dL (ref 8.5–10.5)
Chloride: 106 mEq/L (ref 99–111)
Creatinine: 0.8 mg/dL (ref 0.4–1.0)
Globulin: 3.3 g/dL (ref 2.0–3.6)
Glucose: 88 mg/dL (ref 70–100)
Potassium: 4.2 mEq/L (ref 3.5–5.3)
Protein, Total: 7.7 g/dL (ref 6.0–8.3)
Sodium: 143 mEq/L (ref 135–145)

## 2021-07-12 LAB — THYROID STIMULATING HORMONE (TSH), REFLEX ON ABNORMAL TO FREE T4, SERUM: TSH, Abn Reflex to Free T4, Serum: 0.3 u[IU]/mL — ABNORMAL LOW (ref 0.35–4.94)

## 2021-07-12 LAB — VITAMIN D,25 OH,TOTAL: Vitamin D, 25 OH, Total: 88 ng/mL (ref 30–100)

## 2021-07-12 LAB — HEMOLYSIS INDEX: Hemolysis Index: 0 Index (ref 0–24)

## 2021-07-12 MED ORDER — FLUTICASONE PROPIONATE 50 MCG/ACT NA SUSP
1.0000 | Freq: Every day | NASAL | 1 refills | Status: DC
Start: 2021-07-12 — End: 2022-02-28

## 2021-07-12 NOTE — Progress Notes (Unsigned)
Blood draw:  Using aseptic technique, blood was drawn from the left antecubital without any complication. Attempt # 1. Patient tolerated procedure well. All labs will be sent to [facility] and [facility].    -4 SSTs  ( 1 SST with serum separated into a 1 plastic tube labeled serum) 2 SST to [facility]. 1 SST and 1 plastic transport tube to [facility]      -1 Lav [facility]      [facility] completed and sent with patient.    Patient tolerated procedure well and left in good condition today.

## 2021-07-12 NOTE — Progress Notes (Signed)
Subjective:      Date: 07/12/2021 9:17 AM   Patient ID: Tammy Larson is a 50 y.o. female.    Chief Complaint:  Chief Complaint   Patient presents with    Establish Care       HPI  Patient presents for an establish care examination.     SPECIFIC CONCERNS:     MEDICAL HISTORY:    History of ANEMIA  History of Fibroids  - S/P a Myomectomy in 2015 and Hysterectomy in 2017. Anemia resolved    Increased hot flashes and flushes   - for approximately 5 years.     She takes one a day menopause vitamin - had improvement for a few weeks- however symptoms are as persistent as it had been     Increased bloating  and uncomfortable with gluten and bubbly champagne- in excess  -   pain above the belly button, no symptoms with other foods    Cough and runny nose for about 3 weeks  .  She has a post nasal drip  for the past tra-   she has tried Zyrtec with no relief.        Active Ambulatory Problems     Diagnosis Date Noted    Uterine leiomyoma 12/23/2013     Resolved Ambulatory Problems     Diagnosis Date Noted    No Resolved Ambulatory Problems     Past Medical History:   Diagnosis Date    Abnormal vision     Anemia     Hypertension     Post-operative nausea and vomiting          DIET:  Pretty good. She eats vegetables and fruit- but likes pastries and breads    Fluids: she drinks at least 64 oz per day  Coffee: every morning  1-2 cups of coffee with a lot creamer no sugars nor sweetners     EXERCISE:  3 days a week for an hour.     SLEEP: She falls asleep around 10 pm and then awakens usually a result of menopausal type symptoms. She awakens arround 7 am. She does not consistently feel well rested upon awakening   STRESS: Minimal stress in life      SOCIAL HISTORY:    Married  Children: 1 child      Problem List:  Patient Active Problem List   Diagnosis    Uterine leiomyoma       Current Medications:  Current Outpatient Medications   Medication Sig Dispense Refill    Biotin w/ Vitamins C & E (HAIR/SKIN/NAILS PO) Take 1 tablet  by mouth daily      CALCIUM-MAGNESIUM-ZINC PO Take 1 tablet by mouth daily      Cholecalciferol (D3 PO) Take 5,000 IU by mouth daily      Fish Oil-Cholecalciferol (OMEGA-3 GUMMIES PO) Take 1,200 mg by mouth daily      Multiple Vitamins-Minerals (MULTIVITAMIN WITH MINERALS) tablet Take 1 tablet by mouth daily.      Probiotic Product (PROBIOTIC PO) Take 1 tablet by mouth daily      B Complex Vitamins (VITAMIN B COMPLEX) Tab Take 1 tablet by mouth daily. (Patient not taking: Reported on 07/12/2021)      fluticasone (FLONASE) 50 MCG/ACT nasal spray 1 spray by Nasal route daily 9.9 mL 1    valsartan (DIOVAN) 160 MG tablet Take 160 mg by mouth daily For 90 days       No current facility-administered medications for this  visit.       Allergies:  No Known Allergies    Past Medical History:  Past Medical History:   Diagnosis Date    Abnormal vision     eyeglasses    Anemia     Hypertension     Post-operative nausea and vomiting     apfel=3       Past Surgical History:  Past Surgical History:   Procedure Laterality Date    HYSTERECTOMY  2017    HYSTEROSCOPY, MYOSURE MORCELLATION N/A 12/23/2013    Procedure: Hysteroscopic Myomectomy with Myosure;  Surgeon: Waldon Merl, MD;  Location: [facility];  Service: Gynecology;  Laterality: N/A;    LAPAROSCOPY, DIAGNOSTIC         Family History:  History reviewed. No pertinent family history.    Social History:  Social History     Socioeconomic History    Marital status: Married   Tobacco Use    Smoking status: Never   Substance and Sexual Activity    Alcohol use: Yes     Comment: weekends    Drug use: No        The following sections were reviewed this encounter by the provider:   Tobacco  Allergies  Meds  Problems  Med Hx  Surg Hx  Fam Hx         Review of Systems   Constitutional:  Negative for activity change and fatigue.   HENT:  Positive for postnasal drip and rhinorrhea. Negative for congestion, sinus pressure, sinus pain and sore throat.    Eyes:  Negative for  pain.   Respiratory:  Positive for cough (occasional). Negative for chest tightness and shortness of breath.    Cardiovascular:  Negative for chest pain, palpitations and leg swelling.   Endocrine:        As per hpi   Musculoskeletal:  Negative for neck pain.   Neurological:  Negative for dizziness, syncope, weakness, light-headedness and headaches.   Psychiatric/Behavioral:  Positive for sleep disturbance (as per hpi). The patient is not nervous/anxious.        Objective:     Vitals:  BP (!) 187/111 (BP Site: Left arm, Patient Position: Sitting, Cuff Size: Large)   Pulse 68   Temp 98.4 F (36.9 C) (Oral)   Resp 14   Wt 81 kg (178 lb 9.6 oz)   LMP 12/11/2013   SpO2 98%   BMI 29.72 kg/m     Physical Exam  Vitals and nursing note reviewed.   Constitutional:       General: She is not in acute distress.     Appearance: Normal appearance. She is well-developed.   HENT:      Nose: Mucosal edema present.   Cardiovascular:      Heart sounds: S2 normal.   Pulmonary:      Effort: Pulmonary effort is normal. No respiratory distress.   Abdominal:      General: Abdomen is flat.      Palpations: Abdomen is soft.      Comments: diasthesis   Neurological:      Mental Status: She is alert and oriented to person, place, and time.   Psychiatric:         Mood and Affect: Mood normal.         Assessment/Plan:     1. Encounter to establish care    2. Perimenopausal vasomotor symptoms  Recommended following self-care measures:avoidance of spicy foods, alcohol, caffeine, warm environments, and stress.  Decrease: Alcohol and caffeine intake especially for vasomotor symptoms -Wearing layered clothing and use of hand-held fans, drinking cold water, and mist bottles may be helpful.   - yoga or mind body wellness exercises  - follow a well-balanced nutritious diet, take nutritional supplements (calcium and vitamin D), and exercise at least 30 minutes most days of the week.  3. Abdominal bloating  - Advised she avoid or decrease inciting  foods/drinks  - Celiac Disease Screen w/ Rflx; Future  - Celiac Disease Screen w/ Rflx    4. Vitamin deficiency  History thereof.  - Vitamin D,25 OH, Total; Future  - Vitamin D,25 OH, Total    5. Elevated blood-pressure reading without diagnosis of hypertension  Historically she reports having elevated blood pressures in office- home blood pressures are reportedly "normal"    6. URI type symptoms  Rhinitis  Antihistamine such as Allegra.  Use in comniation with flonase   Nasal saline irrigation  Follow up for persistent or worsening symptoms    7. Breast cancer screening by mammogram  - Mammo Digital 2D Screening Bilateral W CAD; Future    8. Laboratory tests ordered as part of a complete physical exam (CPE)  - Vitamin D,25 OH, Total; Future  - TSH, Abn Reflex to Free T4, Serum; Future  - Lipid panel; Future  - Comprehensive metabolic panel; Future  - CBC and differential; Future  - CBC and differential  - Comprehensive metabolic panel  - Lipid panel  - TSH, Abn Reflex to Free T4, Serum  - Vitamin D,25 OH, Total        Return for CPE.    Heath Gold, MD

## 2021-07-13 LAB — T4, FREE: T4 Free: 0.93 ng/dL (ref 0.69–1.48)

## 2021-07-14 ENCOUNTER — Encounter: Payer: Self-pay | Admitting: Family Medicine

## 2021-07-15 NOTE — Progress Notes (Signed)
Dr. Wilkie Aye is out of the office this week.  There does appear to be some overactivity of your thyroid gland according to the TSH, thyroid-stimulating hormone.  Please follow-up with Dr. Wilkie Aye when she returns next Tuesday  Vitamin D in range and normal sugar liver and kidney tests.  No sign of anemia  Total cholesterol is slightly elevated and LDL, or bad cholesterol, is moderately elevated    Please be sure to discuss the results with Dr. Wilkie Aye upon her return for further guidance.     Best, Dr. Devoria Glassing

## 2021-07-16 ENCOUNTER — Encounter: Payer: Self-pay | Admitting: Family Medicine

## 2021-07-16 ENCOUNTER — Other Ambulatory Visit: Payer: Self-pay

## 2021-07-16 DIAGNOSIS — Z1231 Encounter for screening mammogram for malignant neoplasm of breast: Secondary | ICD-10-CM

## 2021-07-16 LAB — CELIAC DISEASE COMPREHENSIVE PANEL
Deamidated Gliadin Abs, IgA: 10 units (ref 0–19)
Deamidated Gliadin Abs, IgG: 5 units (ref 0–19)
Endomysial IgA: NEGATIVE
Immunoglobulin A: 267 mg/dL (ref 87–352)
Transglutaminase IgA: <2 U/mL (ref 0–3)
t-Transglutaminase (tTG) IgG: <2 U/mL (ref 0–5)

## 2021-07-16 NOTE — Progress Notes (Signed)
Good morning,  Dr. Wilkie Aye is out of the office until next week, but enclosed are your  additional hlaboratory test results.  It appears that the celiac screening came back negative.  Be sure to follow-up with Dr. Wilkie Aye next Tuesday upon her return especially for your thyroid result.   Dr. Devoria Glassing

## 2021-07-17 ENCOUNTER — Encounter: Payer: Self-pay | Admitting: Family Medicine

## 2021-07-24 ENCOUNTER — Other Ambulatory Visit: Payer: Self-pay | Admitting: Family Medicine

## 2021-07-24 DIAGNOSIS — R7989 Other specified abnormal findings of blood chemistry: Secondary | ICD-10-CM

## 2021-08-06 ENCOUNTER — Other Ambulatory Visit: Payer: Self-pay | Admitting: Family Medicine

## 2021-08-10 ENCOUNTER — Other Ambulatory Visit: Payer: Self-pay | Admitting: Obstetrics and Gynecology

## 2021-08-10 DIAGNOSIS — N760 Acute vaginitis: Secondary | ICD-10-CM

## 2021-08-13 ENCOUNTER — Other Ambulatory Visit: Payer: Commercial Managed Care - POS

## 2021-08-20 ENCOUNTER — Ambulatory Visit: Payer: Commercial Managed Care - POS | Admitting: Family Medicine

## 2021-08-21 ENCOUNTER — Encounter: Payer: Self-pay | Admitting: Family Medicine

## 2021-08-21 ENCOUNTER — Ambulatory Visit (INDEPENDENT_AMBULATORY_CARE_PROVIDER_SITE_OTHER): Payer: Commercial Managed Care - POS | Admitting: Family Medicine

## 2021-08-21 VITALS — BP 150/94 | HR 68 | Temp 98.2°F | Resp 14 | Ht 65.5 in | Wt 171.1 lb

## 2021-08-21 DIAGNOSIS — I10 Essential (primary) hypertension: Secondary | ICD-10-CM

## 2021-08-21 DIAGNOSIS — Z0000 Encounter for general adult medical examination without abnormal findings: Secondary | ICD-10-CM

## 2021-08-21 DIAGNOSIS — N951 Menopausal and female climacteric states: Secondary | ICD-10-CM

## 2021-08-21 DIAGNOSIS — Z23 Encounter for immunization: Secondary | ICD-10-CM

## 2021-08-21 MED ORDER — CLONIDINE 0.1 MG/24HR TD PTWK
1.0000 | MEDICATED_PATCH | TRANSDERMAL | 5 refills | Status: DC
Start: 2021-08-21 — End: 2021-11-27

## 2021-08-21 NOTE — Progress Notes (Signed)
Audiology test: Results reviewed with patient in detail.     Vision test:  Results reviewed with patient in detail.     Pulmonary Function Test: Deferred due to the COVID 19 Pandemic.  Patient Education: Reviewed InBody with patient.   Fitness Evaluation:   Body Fat as percentage: In women, over 30% is obese, 26-30% is higher than normal, 23-26% is healthy / normal, <23% or under is considered lean / ideal.   2023 = 40.4%    Visceral Fat: Abdominal "belly" fat.Visceral fat is a type of body fat that exists in the abdomen and surrounds the internal organs. Everyone has some, especially those who are sedentary, chronically stressed, or maintain unhealthy diets. A different type of fat -- subcutaneous fat -- which builds up under the skin, has less of a negative impact on health and is easier to lose than visceral fat. A high level of visceral fat can increase your risk for serious health problems including cardiovascular disease, types 2 diabetes, and increased blood pressure.     2023 = 16 (normal < 10)    Current Exercise Program:  Exercises three times per week-treadmill and aerobics    Fitness Goals:  Decrease weight.  Increase muscle mass.      TDAP Vaccine Administration    0.5 ml of TDAP vaccine injected into left deltoid. Pt tolerated injection and vaccine well. Pt was given VIS today along with care instructions and side effects discussed.

## 2021-08-21 NOTE — Progress Notes (Signed)
IN OFFICE TESTING-  Results of which discussed with patient  Audiogram:WNL Thorughout  Vision: Completed  Distance WNL. Near Vision requires glasses. Failed Depth Perception and Color Blindness and eye exam > 1 year ago      Epworth screening:Will Complete.    Results of iNBody Evaluation   Visceral Fat : 16.   BMI: 28  ZOX:WRUEAVW- will discuss with patient in 2 weeks      HEALTH CARE MAINTENANCE SCREENING  Menstrual cycles: Hysterectomy  Last Menstrual Period: N/A  Last  Pap smear :Any abnormal studies? No   History of STDs?No  Last Mammogram? April 2019    Any abnormal studies? Yes Neg for Malignancy  -Fibroglandular Density    Last DEXA scan? Negative    Last Colonoscopy:  1-3 repeat in 10 years  Any abnormal studies? No  COVID Vaccines completed: Yes Primary Pfizer Series  Pneumonia series completed? No  Tetanus completed: Unsure  INfluenza completedNo-

## 2021-08-21 NOTE — Progress Notes (Signed)
Subjective:      Date: 08/21/2021 10:05 PM   Patient ID: Tammy Larson is a 51 y.o. female.    Chief Complaint:  Chief Complaint   Patient presents with    Annual Exam       HPI  Patient presents for an Comprehensive Health Examination.     MEDICAL HISTORY:    History of Fibroids  - S/P a Myomectomy in 2015 and Hysterectomy in 2017. Anemia resolved     Increased hot flashes and flushes   - for approximately 5 years.     She takes one a day menopause vitamin - had some improvement ie decreased hot flushes during the day- however symptoms at night seemed unchanged and disruptive of her sleep     Increased bloating and uncomfortable with gluten and bubbly champagne- in excess  -   pain above the belly button, no symptoms with other foods     Hypertension  - Home BP, not consistently monitored but range usually in the 140's/80-90's  - taking medication consistently - no adverse symptoms associated    Active Ambulatory Problems     Diagnosis Date Noted    Uterine leiomyoma 12/23/2013    Primary hypertension 08/22/2021    Perimenopausal vasomotor symptoms 08/22/2021     Resolved Ambulatory Problems     Diagnosis Date Noted    No Resolved Ambulatory Problems     Past Medical History:   Diagnosis Date    Abnormal vision     Anemia     Hypertension     Post-operative nausea and vomiting        DIET:  Pretty good. She eats vegetables and fruit- but likes pastries and breads - doing better since last visit              Fluids: she drinks at least 64 oz of water per day.  Coffee: every morning 1 cups of coffee with a lot creamer no sugars nor sweetners     EXERCISE:  3 days a week for an hour- treadmill/aerobic class     SLEEP: She falls asleep around 10 pm and then awakens maybe 3 times around 12, 2 and 4 am. usually a result of menopausal type symptoms. She awakens arround 7 am. 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 Near Vision requires glasses. Failed Depth Perception and Color Blindness . Last eye exam > 1 year ago     Epworth screening:Will Complete.    Results of iNBody Evaluation   Visceral Fat : 16.   BMI: 28     HEALTH CARE MAINTENANCE SCREENING  Menstrual cycles: Hysterectomy   Last Menstrual Period: N/A  Last  Pap smear :Any abnormal studies? No   History of STDs?No  Last Mammogram? April 2019              Any abnormal studies? Yes Neg for Malignancy. Fibroglandular Density     Last DEXA scan? Negative    Last Colonoscopy:  Reportedly 1-3 year prior advised repeat in 10 years  Any abnormal studies? No  COVID Vaccines completed: Yes Primary Pfizer Series  Pneumonia series completed? No  Tetanus  completed: Unsure  INfluenza completed:  No.    PREVENTATIVE HEALTH:  Dental: dentist visit within 6-12 months  Vision: eye exam > 1 year ago    Problem List:  Patient Active Problem List   Diagnosis    Uterine leiomyoma    Primary hypertension    Perimenopausal vasomotor symptoms       Current Medications:  Current Outpatient Medications   Medication Sig Dispense Refill    B Complex Vitamins (VITAMIN B COMPLEX) Tab Take 1 tablet by mouth daily      Biotin w/ Vitamins C & E (HAIR/SKIN/NAILS PO) Take 1 tablet by mouth daily      CALCIUM-MAGNESIUM-ZINC PO Take 1 tablet by mouth daily      Cholecalciferol (D3 PO) Take 5,000 IU by mouth daily      Fish Oil-Cholecalciferol (OMEGA-3 GUMMIES PO) Take 1,200 mg by mouth daily      fluticasone (FLONASE) 50 MCG/ACT nasal spray 1 spray by Nasal route daily 9.9 mL 1    Multiple Vitamins-Minerals (MULTIVITAMIN WITH MINERALS) tablet Take 1 tablet by mouth daily.      Probiotic Product (PROBIOTIC PO) Take 1 tablet by mouth daily      valsartan (DIOVAN) 160 MG tablet Take 160 mg by mouth daily For 90 days      cloNIDine  (CATAPRES-TTS-1) 0.1 MG/24HR Place 1 patch onto the skin once a week 4 patch 5     No current facility-administered medications for this visit.       Allergies:  No Known Allergies    Past Medical History:  Past Medical History:   Diagnosis Date    Abnormal vision     eyeglasses    Anemia     Hypertension     Post-operative nausea and vomiting     apfel=3       Past Surgical History:  Past Surgical History:   Procedure Laterality Date    HYSTERECTOMY  2017    HYSTEROSCOPY, MYOSURE MORCELLATION N/A 12/23/2013    Procedure: Hysteroscopic Myomectomy with Myosure;  Surgeon: Waldon Merl, MD;  Location: [facility];  Service: Gynecology;  Laterality: N/A;    LAPAROSCOPY, DIAGNOSTIC         Family History:  Family History   Problem Relation Age of Onset    Hypertension Mother     Parkinsonism Father     Hypertension Brother     Cancer Paternal Aunt         Colon Cancer    Diabetes Paternal Uncle     Diabetes Paternal Grandmother        Social History:  Social History     Socioeconomic History    Marital status: Married   Tobacco Use    Smoking status: Never    Smokeless tobacco: Never   Vaping Use    Vaping Use: Never used   Substance and Sexual Activity    Alcohol use: Yes     Comment: weekends    Drug use: No        The following sections were reviewed this encounter by the provider:        Review of Systems   Constitutional:  Negative for activity change, appetite change, chills, fatigue, fever and unexpected weight change.   HENT:  Positive for postnasal drip and rhinorrhea. Negative for sinus pressure, sore throat and voice change.    Eyes:  Negative for photophobia and visual disturbance.   Respiratory:  Negative for chest tightness and shortness of  breath.    Cardiovascular:  Negative for chest pain and palpitations.   Gastrointestinal:  Negative for abdominal distention, abdominal pain, constipation and diarrhea.   Endocrine: Negative for cold intolerance and heat intolerance.   Genitourinary:  Negative for  difficulty urinating, dyspareunia, dysuria, menstrual problem, pelvic pain, vaginal bleeding and vaginal discharge.   Musculoskeletal:  Negative for arthralgias.   Skin:  Negative for color change and rash.   Neurological:  Negative for dizziness, weakness, light-headedness and headaches.   Hematological:  Negative for adenopathy.   Psychiatric/Behavioral:  Positive for sleep disturbance. Negative for agitation, confusion and decreased concentration.        Objective:     Vitals:  BP (!) 150/94 (BP Site: Left arm, Patient Position: Sitting, Cuff Size: Medium)    Pulse 68    Temp 98.2 F (36.8 C) (Oral)    Resp 14    Ht 1.664 m (5' 5.5")    Wt 77.6 kg (171 lb 1.6 oz)    LMP 12/11/2013    SpO2 98%    BMI 28.04 kg/m     Physical Exam  Vitals and nursing note reviewed.   Constitutional:       General: She is not in acute distress.     Appearance: Normal appearance. She is well-developed.   HENT:      Head: Normocephalic and atraumatic.      Right Ear: Tympanic membrane, ear canal and external ear normal.      Left Ear: Tympanic membrane, ear canal and external ear normal.      Nose: Mucosal edema present.      Mouth/Throat:      Mouth: Mucous membranes are moist.      Pharynx: No oropharyngeal exudate or posterior oropharyngeal erythema.   Eyes:      Pupils: Pupils are equal, round, and reactive to light.   Neck:      Thyroid: No thyromegaly.   Cardiovascular:      Rate and Rhythm: Normal rate and regular rhythm.      Heart sounds: Normal heart sounds and S2 normal.   Pulmonary:      Effort: Pulmonary effort is normal. No respiratory distress.      Breath sounds: Normal breath sounds.   Chest:      Chest wall: No tenderness.   Breasts:     Right: No inverted nipple, mass, nipple discharge or skin change.      Left: No inverted nipple, mass, nipple discharge or skin change.      Comments: No fixed masses appreciated  Abdominal:      General: Abdomen is flat. There is no distension.      Palpations: Abdomen is soft.       Tenderness: There is no abdominal tenderness.      Comments: diasthesis   Musculoskeletal:         General: No deformity. Normal range of motion.      Cervical back: Normal range of motion and neck supple.   Lymphadenopathy:      Upper Body:      Right upper body: No axillary adenopathy.      Left upper body: No axillary adenopathy.   Skin:     General: Skin is warm and dry.   Neurological:      General: No focal deficit present.      Mental Status: She is alert and oriented to person, place, and time.      Cranial Nerves: No cranial nerve  deficit.      Sensory: No sensory deficit.   Psychiatric:         Mood and Affect: Mood normal.         Behavior: Behavior normal.         Thought Content: Thought content normal.       Assessment/Plan:   1. Annual physical exam    2. Perimenopausal vasomotor symptoms  - cloNIDine (CATAPRES-TTS-1) 0.1 MG/24HR; Place 1 patch onto the skin once a week  Dispense: 4 patch; Refill: 5  Side effects of medication reviewed    3. Primary hypertension  Chronic- not controlled  Advised monitoring with the addition of clonidine for vasomotor symptoms  Trial of clonidine  -  need to monitor BP and fllow up in 1 month., sooner as needed   Continued therapeutic lifestyle changes which include obtaining at least 150 minutes of aerobic exercise per week and eating a heart healthy diet ( i.e. DASH diet - www.heart.org or PopSteam.is ). Recommend checking ambulatory blood pressures once a day most days out of the month and document in a log for review  Call for adverse symptoms    4. Need for tetanus, diphtheria, and acellular pertussis (Tdap) vaccine  - Tdap vaccine greater than or equal to 7yo IM        Health Maintenance:  High BMI follow-up: BMI Follow Up Care Plan Documented. Encouraged and provided contact information for follow-up appointment with [facility] [employer] trainer to discuss diet and nutrition in more depth, and devise an  individualized management plan  Recommend optimizing low  carbohydrate diet efforts and obtaining at least 150 minutes of aerobic exercise per week. Need to incorporate stretching in her exercise regimen to address random aches and pain and improve flexibility  Recommend 20-25 grams of dietary fiber daily.   Recommend drinking at least 60-80 ounces of water per day.   Recommend optimizing low sodium diet measures ( less than 2 grams of sodium in the diet per day ).     Mammogram screening is due.  Screening mammogram ordered.  Advance directive forms provided    Recommend Ophthalmology follow up for re-evaluation and management  Recommmend Tetanus vaccination  Recommend COVID bi-valent booster vaccine       Return in about 1 month (around 09/21/2021) for HTN recheck and re-evaluationof perimenopausalresponse to Clonidine.-  blood pressures in the interim to be sent via Mychart    Heath Gold, MD

## 2021-08-22 ENCOUNTER — Encounter: Payer: Self-pay | Admitting: Family Medicine

## 2021-08-22 DIAGNOSIS — N951 Menopausal and female climacteric states: Secondary | ICD-10-CM | POA: Insufficient documentation

## 2021-08-22 DIAGNOSIS — I10 Essential (primary) hypertension: Secondary | ICD-10-CM | POA: Insufficient documentation

## 2021-08-22 DIAGNOSIS — R7989 Other specified abnormal findings of blood chemistry: Secondary | ICD-10-CM

## 2021-08-26 ENCOUNTER — Other Ambulatory Visit: Payer: Self-pay | Admitting: Obstetrics and Gynecology

## 2021-08-26 DIAGNOSIS — N760 Acute vaginitis: Secondary | ICD-10-CM

## 2021-08-27 NOTE — Telephone Encounter (Signed)
Called and spoke with patient and she reports still having issues with Vulvovaginitis. Rx prescribed on 02/2021 Last annual exam 03/2021

## 2021-08-29 MED ORDER — BETAMETHASONE VALERATE 0.1 % EX OINT: TOPICAL_OINTMENT | CUTANEOUS | 0 refills | Status: DC

## 2021-08-29 NOTE — Telephone Encounter (Signed)
Last time she was here she had yeast. She should be seen. I will call I 15 grams to hold her over.

## 2021-08-30 NOTE — Telephone Encounter (Signed)
Patient informed, she was driving will call back to schedule OV.

## 2021-09-05 ENCOUNTER — Encounter: Payer: Self-pay | Admitting: Obstetrics and Gynecology

## 2021-09-05 ENCOUNTER — Other Ambulatory Visit: Payer: Self-pay

## 2021-09-05 ENCOUNTER — Ambulatory Visit: Payer: BC Managed Care – PPO | Admitting: Obstetrics and Gynecology

## 2021-09-05 VITALS — BP 128/82 | HR 63 | Ht 65.0 in | Wt 176.0 lb

## 2021-09-05 DIAGNOSIS — N762 Acute vulvitis: Secondary | ICD-10-CM | POA: Diagnosis not present

## 2021-09-05 DIAGNOSIS — B3731 Acute candidiasis of vulva and vagina: Secondary | ICD-10-CM | POA: Diagnosis not present

## 2021-09-05 LAB — WET PREP FOR TRICH, YEAST, CLUE

## 2021-09-05 MED ORDER — HYDROXYZINE HCL 10 MG PO TABS: 10.0000 mg | ORAL_TABLET | Freq: Three times a day (TID) | ORAL | 0 refills | Status: AC | PRN

## 2021-09-05 MED ORDER — CLOBETASOL PROPIONATE 0.05 % EX OINT: 1.0000 "application " | TOPICAL_OINTMENT | Freq: Two times a day (BID) | CUTANEOUS | 0 refills | Status: AC

## 2021-09-05 MED ORDER — FLUCONAZOLE 150 MG PO TABS: 150.0000 mg | ORAL_TABLET | Freq: Once | ORAL | 0 refills | Status: AC

## 2021-09-05 NOTE — Patient Instructions (Signed)
Vaginal Yeast Infection, Adult °Vaginal yeast infection is a condition that causes vaginal discharge as well as soreness, swelling, and redness (inflammation) of the vagina. This is a common condition. Some women get this infection frequently. °What are the causes? °This condition is caused by a change in the normal balance of the yeast (Candida) and normal bacteria that live in the vagina. This change causes an overgrowth of yeast, which causes the inflammation. °What increases the risk? °The condition is more likely to develop in women who: °Take antibiotic medicines. °Have diabetes. °Take birth control pills. °Are pregnant. °Douche often. °Have a weak body defense system (immune system). °Have been taking steroid medicines for a long time. °Frequently wear tight clothing. °What are the signs or symptoms? °Symptoms of this condition include: °White, thick, creamy vaginal discharge. °Swelling, itching, redness, and irritation of the vagina. The lips of the vagina (labia) may be affected as well. °Pain or a burning feeling while urinating. °Pain during sex. °How is this diagnosed? °This condition is diagnosed based on: °Your medical history. °A physical exam. °A pelvic exam. Your health care provider will examine a sample of your vaginal discharge under a microscope. Your health care provider may send this sample for testing to confirm the diagnosis. °How is this treated? °This condition is treated with medicine. Medicines may be over-the-counter or prescription. You may be told to use one or more of the following: °Medicine that is taken by mouth (orally). °Medicine that is applied as a cream (topically). °Medicine that is inserted directly into the vagina (suppository). °Follow these instructions at home: °Take or apply over-the-counter and prescription medicines only as told by your health care provider. °Do not use tampons until your health care provider approves. °Do not have sex until your infection has  cleared. Sex can prolong or worsen your symptoms of infection. Ask your health care provider when it is safe to resume sexual activity. °Keep all follow-up visits. This is important. °How is this prevented? ° °Do not wear tight clothes, such as pantyhose or tight pants. °Wear breathable cotton underwear. °Do not use douches, perfumed soap, creams, or powders. °Wipe from front to back after using the toilet. °If you have diabetes, keep your blood sugar levels under control. °Ask your health care provider for other ways to prevent yeast infections. °Contact a health care provider if: °You have a fever. °Your symptoms go away and then return. °Your symptoms do not get better with treatment. °Your symptoms get worse. °You have new symptoms. °You develop blisters in or around your vagina. °You have blood coming from your vagina and it is not your menstrual period. °You develop pain in your abdomen. °Summary °Vaginal yeast infection is a condition that causes discharge as well as soreness, swelling, and redness (inflammation) of the vagina. °This condition is treated with medicine. Medicines may be over-the-counter or prescription. °Take or apply over-the-counter and prescription medicines only as told by your health care provider. °Do not douche. Resume sexual activity or use of tampons as instructed by your health care provider. °Contact a health care provider if your symptoms do not get better with treatment or your symptoms go away and then return. °This information is not intended to replace advice given to you by your health care provider. Make sure you discuss any questions you have with your health care provider. °Document Revised: 10/01/2020 Document Reviewed: 10/01/2020 °Elsevier Patient Education © 2022 [employer] ° °

## 2021-09-05 NOTE — Progress Notes (Signed)
GYNECOLOGY  VISIT   HPI: 51 y.o.   Married Black or [place] American Not Hispanic or Latino  female   G1P1001 with No LMP recorded. (Menstrual status: IUD).   here for vaginal itching for about one month. She says that last two weeks it has been very bad. Itching is worse at night.  No vaginal d/c.   She has taken the provera every other month 3 x without bleeding.  Urge incontinence is well controlled with ditropan.    GYNECOLOGIC HISTORY: No LMP recorded. (Menstrual status: IUD). Contraception:IUD  Menopausal hormone therapy: none         OB History     Gravida  1   Para  1   Term  1   Preterm      AB      Living  1      SAB      IAB      Ectopic      Multiple      Live Births  1        Obstetric Comments  Also 3 adopted children (2 boys and 1 girl)            Patient Active Problem List   Diagnosis Date Noted   Exposure to COVID-19 virus 05/19/2021   Hordeolum 02/07/2021   Anxiety with depression 09/06/2019   Open wound of toe 02/09/2019   Bursitis of left hip 09/29/2018   Cigarette nicotine dependence without complication 11/19/2017   Essential hypertension 11/19/2017   Dyslipidemia 09/19/2015   Muscle cramp 10/17/2014   Type 2 diabetes mellitus without complication, with long-term current use of insulin (HCC) 11/24/2013    Past Medical History:  Diagnosis Date   Diabetes mellitus    Dyspareunia    Elevated serum creatinine    Hypertension    Sciatica     Past Surgical History:  Procedure Laterality Date   CESAREAN SECTION     paragard     removal 7-16 & reinsertion 02-13-15    Current Outpatient Medications  Medication Sig Dispense Refill   albuterol (VENTOLIN HFA) 108 (90 Base) MCG/ACT inhaler Inhale 2 puffs into the lungs every 6 (six) hours as needed for wheezing or shortness of breath. 8 g 0   clobetasol ointment (TEMOVATE) 0.05 % Apply 1 application topically 2 (two) times daily. Apply a pea sized amount twice daily for up  to 2 weeks 30 g 0   erythromycin ophthalmic ointment Place 1 application into the right eye at bedtime. 3.5 g 0   fluconazole (DIFLUCAN) 150 MG tablet Take 1 tablet (150 mg total) by mouth once for 1 dose. Take one tablet.  Repeat in 72 hours if symptoms are not completely resolved. 2 tablet 0   Fluticasone Furoate-Vilanterol (BREO ELLIPTA) 100-25 MCG/INH AEPB Inhale 1 puff into the lungs daily as needed. 28 each 0   hydrOXYzine (ATARAX) 10 MG tablet Take 1 tablet (10 mg total) by mouth 3 (three) times daily as needed for itching. 20 tablet 0   JARDIANCE 10 MG TABS tablet Take 1 tablet by mouth once daily 90 tablet 0   lidocaine (XYLOCAINE) 5 % ointment Apply 1 application topically 4 (four) times daily as needed. 30 g 1   losartan (COZAAR) 100 MG tablet Take 1 tablet (100 mg total) by mouth daily. 90 tablet 3   metFORMIN (GLUCOPHAGE) 1000 MG tablet TAKE 1 TABLET BY MOUTH TWICE DAILY WITH A MEAL 180 tablet 2   mupirocin ointment (BACTROBAN) 2 %  Apply 1 application topically 2 (two) times daily. To leg 22 g 0   ONETOUCH ULTRA test strip USE AS DIRECTED TWICE DAILY TO CHECK BLOOD SUGAR 100 each 0   oxybutynin (DITROPAN XL) 5 MG 24 hr tablet Take 1 tablet (5 mg total) by mouth at bedtime. 90 tablet 3   pravastatin (PRAVACHOL) 10 MG tablet Take 1 tablet (10 mg total) by mouth daily. 90 tablet 2   Semaglutide, 1 MG/DOSE, 4 MG/3ML SOPN Inject 1 mg as directed once a week. 12 mL 1   TRESIBA FLEXTOUCH 100 UNIT/ML FlexTouch Pen INJECT 0.25ML (25MG  UNITS) INTO THE SKIN DAILY 15 mL 0   valACYclovir (VALTREX) 500 MG tablet TAKE 1 TABLET BY MOUTH ONCE DAILY, increase to one tablet po BID for 3 days with an outbreak. 90 tablet 4   amLODipine (NORVASC) 10 MG tablet Take 1 tablet (10 mg total) by mouth daily. 90 tablet 2   No current facility-administered medications for this visit.     ALLERGIES: Patient has no known allergies.  Family History  Problem Relation Age of Onset   Hypertension Father    COPD  Father    Emphysema Father    Diabetes Maternal Aunt    Hypertension Mother    Hyperlipidemia Mother    Colon cancer Mother    COPD Paternal Grandfather    Hypertension Brother    Heart disease Neg Hx     Social History   Socioeconomic History   Marital status: Married    Spouse name: Not on file   Number of children: 4   Years of education: 14   Highest education level: Not on file  Occupational History   Occupation: Nutritition   Tobacco Use   Smoking status: Every Day    Packs/day: 1.00    Years: 22.00    Pack years: 22.00    Types: Cigarettes   Smokeless tobacco: Never  Substance and Sexual Activity   Alcohol use: Not Currently   Drug use: No   Sexual activity: Yes    Partners: Male    Birth control/protection: I.U.D.  Other Topics Concern   Not on file  Social History Narrative   Born and raised in [place]. Fun: Sleep   Denies any religious beliefs effecting health care.    Social Determinants of Health   Financial Resource Strain: Not on file  Food Insecurity: Not on file  Transportation Needs: Not on file  Physical Activity: Not on file  Stress: Not on file  Social Connections: Not on file  Intimate Partner Violence: Not on file    Review of Systems  Genitourinary:        Vaginal itching   All other systems reviewed and are negative.  PHYSICAL EXAMINATION:    BP 128/82    Pulse 63    Ht 5\' 5"  (1.651 m)    Wt 176 lb (79.8 kg)    SpO2 98%    BMI 29.29 kg/m     General appearance: alert, cooperative and appears stated age  Pelvic: External genitalia:  erythematous, swollen, mild whitening, no agglutination,  no fissures.               Urethra:  normal appearing urethra with no masses, tenderness or lesions              Bartholins and Skenes: normal                 Vagina: normal appearing vagina with normal color and  discharge, no lesions              Cervix: no lesions               Chaperone was present for exam.  1. Acute vulvitis Severe - WET  PREP FOR TRICH, YEAST, CLUE - clobetasol ointment (TEMOVATE) 0.05 %; Apply 1 application topically 2 (two) times daily. Apply a pea sized amount twice daily for up to 2 weeks  Dispense: 30 g; Refill: 0 - hydrOXYzine (ATARAX) 10 MG tablet; Take 1 tablet (10 mg total) by mouth 3 (three) times daily as needed for itching.  Dispense: 20 tablet; Refill: 0 -Discussed vulvar skin care -Return in 2 weeks if not better  2. Yeast vaginitis -this is her second infection since 9/22 - fluconazole (DIFLUCAN) 150 MG tablet; Take 1 tablet (150 mg total) by mouth once for 1 dose. Take one tablet.  Repeat in 72 hours if symptoms are not completely resolved.  Dispense: 2 tablet; Refill: 0 -She is on jardiance which can increase her risk of yeast infections

## 2021-09-09 ENCOUNTER — Encounter: Payer: Self-pay | Admitting: Family Medicine

## 2021-09-09 DIAGNOSIS — I10 Essential (primary) hypertension: Secondary | ICD-10-CM

## 2021-09-09 MED ORDER — VALSARTAN 160 MG PO TABS
160.0000 mg | ORAL_TABLET | Freq: Every day | ORAL | 0 refills | Status: DC
Start: 2021-09-09 — End: 2021-09-23

## 2021-09-16 ENCOUNTER — Ambulatory Visit: Payer: BC Managed Care – PPO | Admitting: Family Medicine

## 2021-09-17 ENCOUNTER — Ambulatory Visit: Payer: BC Managed Care – PPO | Admitting: Family Medicine

## 2021-09-22 ENCOUNTER — Encounter: Payer: Self-pay | Admitting: Family Medicine

## 2021-09-23 ENCOUNTER — Encounter: Payer: Self-pay | Admitting: Family Medicine

## 2021-09-23 ENCOUNTER — Telehealth: Payer: Self-pay

## 2021-09-23 ENCOUNTER — Telehealth (INDEPENDENT_AMBULATORY_CARE_PROVIDER_SITE_OTHER): Payer: Commercial Managed Care - POS | Admitting: Family Medicine

## 2021-09-23 DIAGNOSIS — N951 Menopausal and female climacteric states: Secondary | ICD-10-CM

## 2021-09-23 DIAGNOSIS — I10 Essential (primary) hypertension: Secondary | ICD-10-CM

## 2021-09-23 MED ORDER — ESTRADIOL 0.05 MG/24HR TD PTWK
1.0000 | MEDICATED_PATCH | TRANSDERMAL | 5 refills | Status: DC
Start: 2021-09-23 — End: 2022-03-03

## 2021-09-23 MED ORDER — VALSARTAN-HYDROCHLOROTHIAZIDE 160-25 MG PO TABS
1.0000 | ORAL_TABLET | Freq: Every day | ORAL | 1 refills | Status: DC
Start: 2021-09-23 — End: 2022-03-29

## 2021-09-23 NOTE — Telephone Encounter (Signed)
Mammogram report requested from [phone].

## 2021-09-23 NOTE — Progress Notes (Signed)
Subjective:      Date: 09/23/2021 12:18 PM   Patient ID: Tammy Larson is a 51 y.o. female.    Chief Complaint:  No chief complaint on file.      HPI:  Patient has verbally consented to this telemedicine encounter for evaluation and management.      HPI Tammy Larson is a 51 y.o. female who presents to address the following:     Perimenopausal vasomotor symptoms  Started on cloNIDine on which she has noted improvement vasomotor control during the course of the day- little or no symptoms,  however at night still having hot flashes and flushes which are affecting his sleep negatively.  Her blood pressure is well has only improved such that it is 147/90 on average.     2, Primary hypertension  - Home BP, averaged 147/90  Started on clonidine approximately 1 month prior- taking medication consistently - no adverse symptoms associated  No chest pain, shortness of breath no palpitations    DIET:  Pretty good. She eats vegetables and fruit- but likes pastries and breads               Fluids: she drinks at least 64 oz of water per day.  Coffee: every morning 1 cups of coffee with a lot creamer no sugars nor sweetners    EXERCISE:  3 days a week for an hour- treadmill/aerobic class     SLEEP: She falls asleep around 10 pm and then awakens maybe 3 times around 12, 2 and 4 am. usually a result of menopausal type symptoms. She awakens arround 7 am. She does not feel well rested upon awakening     No problems updated.      Problem List:  Patient Active Problem List   Diagnosis    Uterine leiomyoma    Primary hypertension    Perimenopausal vasomotor symptoms       Current Medications:  Current Outpatient Medications   Medication Sig Dispense Refill    B Complex Vitamins (VITAMIN B COMPLEX) Tab Take 1 tablet by mouth daily      Biotin w/ Vitamins C & E (HAIR/SKIN/NAILS PO) Take 1 tablet by mouth daily      CALCIUM-MAGNESIUM-ZINC PO Take 1 tablet by mouth daily      Cholecalciferol (D3 PO) Take 5,000 IU by mouth daily       cloNIDine (CATAPRES-TTS-1) 0.1 MG/24HR Place 1 patch onto the skin once a week 4 patch 5    estradiol (CLIMARA) 0.05 MG/24HR Place 1 patch onto the skin once a week 4 patch 5    Fish Oil-Cholecalciferol (OMEGA-3 GUMMIES PO) Take 1,200 mg by mouth daily      fluticasone (FLONASE) 50 MCG/ACT nasal spray 1 spray by Nasal route daily 9.9 mL 1    Multiple Vitamins-Minerals (MULTIVITAMIN WITH MINERALS) tablet Take 1 tablet by mouth daily.      Probiotic Product (PROBIOTIC PO) Take 1 tablet by mouth daily      valsartan-hydroCHLOROthiazide (DIOVAN-HCT) 160-25 MG per tablet Take 1 tablet by mouth daily 90 tablet 1     No current facility-administered medications for this visit.       Allergies:  No Known Allergies    Past Medical History:  Past Medical History:   Diagnosis Date    Abnormal vision     eyeglasses    Anemia     Hypertension     Post-operative nausea and vomiting     apfel=3  Past Surgical History:  Past Surgical History:   Procedure Laterality Date    HYSTERECTOMY  2017    HYSTEROSCOPY, MYOSURE MORCELLATION N/A 12/23/2013    Procedure: Hysteroscopic Myomectomy with Myosure;  Surgeon: Waldon Merl, MD;  Location: [facility];  Service: Gynecology;  Laterality: N/A;    LAPAROSCOPY, DIAGNOSTIC         Family History:  Family History   Problem Relation Age of Onset    Hypertension Mother     Parkinsonism Father     Hypertension Brother     Cancer Paternal Aunt         Colon Cancer    Diabetes Paternal Uncle     Diabetes Paternal Grandmother        Social History:  Social History     Socioeconomic History    Marital status: Married   Tobacco Use    Smoking status: Never    Smokeless tobacco: Never   Vaping Use    Vaping Use: Never used   Substance and Sexual Activity    Alcohol use: Yes     Comment: weekends    Drug use: No        The following sections were reviewed this encounter by the provider:   Tobacco  Allergies  Meds  Problems  Med Hx  Surg Hx  Fam Hx         ROS:  Review of Systems    Constitutional:  Positive for fatigue. Negative for activity change.   Eyes:  Negative for pain.   Respiratory:  Negative for chest tightness and shortness of breath.    Cardiovascular:  Negative for chest pain, palpitations and leg swelling.   Musculoskeletal:  Negative for neck pain.   Neurological:  Negative for dizziness, syncope, weakness, light-headedness and headaches.   Psychiatric/Behavioral:  Positive for sleep disturbance (per HPI). The patient is not nervous/anxious.       Objective:     Vitals:  LMP 12/11/2013   Average BP  147/90    Physical Exam  Constitutional:       Appearance: Normal appearance.   Pulmonary:      Effort: Pulmonary effort is normal. No respiratory distress.   Neurological:      Mental Status: She is alert.   Psychiatric:         Mood and Affect: Mood normal.         Behavior: Behavior normal.       Assessment/Plan:       1. Primary hypertension  - valsartan-hydroCHLOROthiazide (DIOVAN-HCT) 160-25 MG per tablet; Take 1 tablet by mouth daily  Dispense: 90 tablet; Refill: 1    2. Perimenopausal vasomotor symptoms  - estradiol (CLIMARA) 0.05 MG/24HR; Place 1 patch onto the skin once a week  Dispense: 4 patch; Refill: 5    Discussed at length with patient regarding management of perimenopausal/menopausal vasomotor symptoms-risks and benefits reviewed patient amenable to hormone replacement therapy.  Patient confirmed she is aware of the risks of coronary artery disease and thromboembolic events; in addition to breast cancer risk  Advised to discontinue clonidine  Recommend optimizing therapeutic lifestyle changes which include obtaining at least 150 minutes of aerobic exercise per week and eating a heart healthy diet ( i.e. DASH diet - www.heart.org or PopSteam.is ). Recommend checking ambulatory blood pressures once aday and document in a log for review    Annual mammograms encouraged- last study done about 1 week prior requested  Medications and dosing intervals reviewed at  this visit    Return in about 1 month (around 10/21/2021), or if symptoms worsen or fail to improve.        Heath Gold, MD

## 2021-09-24 ENCOUNTER — Encounter: Payer: Self-pay | Admitting: Family Medicine

## 2021-09-24 NOTE — Telephone Encounter (Signed)
Called and requested Mammogram result a second time.

## 2021-10-04 ENCOUNTER — Ambulatory Visit: Payer: BC Managed Care – PPO | Admitting: Family Medicine

## 2021-10-09 ENCOUNTER — Other Ambulatory Visit: Payer: Self-pay | Admitting: Family Medicine

## 2021-10-21 ENCOUNTER — Encounter: Payer: Self-pay | Admitting: Family Medicine

## 2021-10-21 ENCOUNTER — Ambulatory Visit: Payer: BC Managed Care – PPO | Admitting: Family Medicine

## 2021-10-21 ENCOUNTER — Other Ambulatory Visit: Payer: Self-pay

## 2021-10-21 VITALS — BP 152/80 | HR 90 | Ht 65.0 in | Wt 180.3 lb

## 2021-10-21 DIAGNOSIS — Z794 Long term (current) use of insulin: Secondary | ICD-10-CM

## 2021-10-21 DIAGNOSIS — F418 Other specified anxiety disorders: Secondary | ICD-10-CM

## 2021-10-21 DIAGNOSIS — E119 Type 2 diabetes mellitus without complications: Secondary | ICD-10-CM | POA: Diagnosis not present

## 2021-10-21 DIAGNOSIS — F17210 Nicotine dependence, cigarettes, uncomplicated: Secondary | ICD-10-CM

## 2021-10-21 DIAGNOSIS — I10 Essential (primary) hypertension: Secondary | ICD-10-CM

## 2021-10-21 LAB — POCT GLYCOSYLATED HEMOGLOBIN (HGB A1C): Hemoglobin A1C: 9 % — AB (ref 4.0–5.6)

## 2021-10-21 MED ORDER — AMLODIPINE BESYLATE 10 MG PO TABS: 10.0000 mg | ORAL_TABLET | Freq: Every day | ORAL | 2 refills | Status: DC

## 2021-10-21 MED ORDER — ONETOUCH ULTRA VI STRP: ORAL_STRIP | 99 refills | Status: AC

## 2021-10-21 MED ORDER — CLONAZEPAM 1 MG PO TABS
1.0000 mg | ORAL_TABLET | Freq: Every day | ORAL | 1 refills | Status: DC | PRN
Start: 2021-10-21 — End: 2023-07-06

## 2021-10-21 MED ORDER — PRAVASTATIN SODIUM 10 MG PO TABS: 10.0000 mg | ORAL_TABLET | Freq: Every day | ORAL | 2 refills | Status: DC

## 2021-10-21 MED ORDER — SEMAGLUTIDE (2 MG/DOSE) 8 MG/3ML ~~LOC~~ SOPN: 2.0000 mg | PEN_INJECTOR | SUBCUTANEOUS | 1 refills | Status: AC

## 2021-10-21 MED ORDER — LOSARTAN POTASSIUM 100 MG PO TABS: 100.0000 mg | ORAL_TABLET | Freq: Every day | ORAL | 3 refills | Status: DC

## 2021-10-21 NOTE — Assessment & Plan Note (Signed)
Diabetes is not well controlled, A1c today is 9.0%.  Increasing Ozempic to 2 mg.  Discontinuing Jardiance due to side effects.  Continue Tresiba at current strength.  Dietary changes encouraged. ?

## 2021-10-21 NOTE — Assessment & Plan Note (Signed)
Blood pressure is elevated today.  Stress likely contributing to blood pressure elevation as this has been well controlled previously.  Recommend continuation of current medications.  We will try to work on management of her stress as well.  Low-sodium diet encouraged. ?

## 2021-10-21 NOTE — Assessment & Plan Note (Signed)
Encouraged to discontinue smoking however she does not feel like she is at a point where she would like to quit at this time. ?

## 2021-10-21 NOTE — Patient Instructions (Signed)
Stop jardiance ?Increase Ozempic to 2mg  weekly.  ?You may use clonazepam sparingly as needed for anxiety.  ?Have blood pressure rechecked at nurse visit in 1 month.  ?See me again in 3-4 months.  ?

## 2021-10-21 NOTE — Assessment & Plan Note (Signed)
Adding clonazepam back on to use as needed.  Encouraged to continue to see her therapist. ?

## 2021-10-21 NOTE — Progress Notes (Signed)
?[name] Hurley - 51 y.o. female MRN [id number]  Date of birth: 1970-12-12 ? ?Subjective ?Chief Complaint  ?Patient presents with  ? Diabetes  ? Hypertension  ? ? ?HPI ?[name] Hurley is a 51 year old female here today for follow-up visit. ? ?She reports she has not been doing very well with following for the past several weeks.  She is using prescribed medications as directed.  She would like to discontinue Jardiance due to issues with yeast infections. ? ?Blood pressure is elevated today.  She admits to increased stress and anxiety.  She has been unable to pinpoint source of her anxiety at this point.  She does continue to see her therapist.  She denies any symptoms related to hypertension including chest pain, shortness of breath, palpitations, headaches or vision changes. ? ?ROS:  A comprehensive ROS was completed and negative except as noted per HPI ? ?No Known Allergies ? ?Past Medical History:  ?Diagnosis Date  ? Diabetes mellitus   ? Dyspareunia   ? Elevated serum creatinine   ? Hypertension   ? Sciatica   ? ? ?Past Surgical History:  ?Procedure Laterality Date  ? CESAREAN SECTION    ? paragard    ? removal 7-16 & reinsertion 02-13-15  ? ? ?Social History  ? ?Socioeconomic History  ? Marital status: Married  ?  Spouse name: Not on file  ? Number of children: 4  ? Years of education: 70  ? Highest education level: Not on file  ?Occupational History  ? Occupation: Nutritition   ?Tobacco Use  ? Smoking status: Every Day  ?  Packs/day: 1.00  ?  Years: 22.00  ?  Pack years: 22.00  ?  Types: Cigarettes  ? Smokeless tobacco: Never  ?Substance and Sexual Activity  ? Alcohol use: Not Currently  ? Drug use: No  ? Sexual activity: Yes  ?  Partners: Male  ?  Birth control/protection: I.U.D.  ?Other Topics Concern  ? Not on file  ?Social History Narrative  ? Born and raised in [place]. Fun: Sleep  ? Denies any religious beliefs effecting health care.   ? ?Social Determinants of Health  ? ?Financial Resource Strain: Not on file  ?Food  Insecurity: Not on file  ?Transportation Needs: Not on file  ?Physical Activity: Not on file  ?Stress: Not on file  ?Social Connections: Not on file  ? ? ?Family History  ?Problem Relation Age of Onset  ? Hypertension Father   ? COPD Father   ? Emphysema Father   ? Diabetes Maternal Aunt   ? Hypertension Mother   ? Hyperlipidemia Mother   ? Colon cancer Mother   ? COPD Paternal Grandfather   ? Hypertension Brother   ? Heart disease Neg Hx   ? ? ?Health Maintenance  ?Topic Date Due  ? OPHTHALMOLOGY EXAM  10/21/2021 (Originally 07/25/2020)  ? COVID-19 Vaccine (4 - Booster for Pfizer series) 03/28/2022 (Originally 07/09/2020)  ? MAMMOGRAM  10/22/2022 (Originally 10/12/2021)  ? HIV Screening  10/22/2022 (Originally 01/[birth year]/1984)  ? HEMOGLOBIN A1C  04/23/2022  ? FOOT EXAM  05/13/2022  ? Fecal DNA (Cologuard)  03/08/2023  ? PAP SMEAR-Modifier  04/12/2023  ? TETANUS/TDAP  02/19/2030  ? INFLUENZA VACCINE  Completed  ? Hepatitis C Screening  Completed  ? Zoster Vaccines- Shingrix  Completed  ? HPV VACCINES  Aged Out  ? ? ? ?----------------------------------------------------------------------------------------------------------------------------------------------------------------------------------------------------------------- ?Physical Exam ?BP (!) 152/80   Pulse 90   Ht 5\' 5"  (1.651 m)   Wt 180 lb 4.8 oz (  81.8 kg)   SpO2 98%   BMI 30.00 kg/m?  ? ?Physical Exam ?Constitutional:   ?   Appearance: Normal appearance.  ?HENT:  ?   Head: Normocephalic and atraumatic.  ?Cardiovascular:  ?   Rate and Rhythm: Normal rate and regular rhythm.  ?Pulmonary:  ?   Effort: Pulmonary effort is normal.  ?   Breath sounds: Normal breath sounds.  ?Musculoskeletal:  ?   Cervical back: Neck supple.  ?Neurological:  ?   General: No focal deficit present.  ?   Mental Status: She is alert.  ?Psychiatric:     ?   Mood and Affect: Mood normal.     ?   Behavior: Behavior normal.   ? ? ?------------------------------------------------------------------------------------------------------------------------------------------------------------------------------------------------------------------- ?Assessment and Plan ? ?Essential hypertension ?Blood pressure is elevated today.  Stress likely contributing to blood pressure elevation as this has been well controlled previously.  Recommend continuation of current medications.  We will try to work on management of her stress as well.  Low-sodium diet encouraged. ? ?Type 2 diabetes mellitus without complication, with long-term current use of insulin (HCC) ?Diabetes is not well controlled, A1c today is 9.0%.  Increasing Ozempic to 2 mg.  Discontinuing Jardiance due to side effects.  Continue Tresiba at current strength.  Dietary changes encouraged. ? ?Anxiety with depression ?Adding clonazepam back on to use as needed.  Encouraged to continue to see her therapist. ? ?Cigarette nicotine dependence without complication ?Encouraged to discontinue smoking however she does not feel like she is at a point where she would like to quit at this time. ? ? ?Meds ordered this encounter  ?Medications  ? amLODipine (NORVASC) 10 MG tablet  ?  Sig: Take 1 tablet (10 mg total) by mouth daily.  ?  Dispense:  90 tablet  ?  Refill:  2  ? Semaglutide, 2 MG/DOSE, 8 MG/3ML SOPN  ?  Sig: Inject 2 mg as directed once a week.  ?  Dispense:  9 mL  ?  Refill:  1  ? pravastatin (PRAVACHOL) 10 MG tablet  ?  Sig: Take 1 tablet (10 mg total) by mouth daily.  ?  Dispense:  90 tablet  ?  Refill:  2  ? losartan (COZAAR) 100 MG tablet  ?  Sig: Take 1 tablet (100 mg total) by mouth daily.  ?  Dispense:  90 tablet  ?  Refill:  3  ? clonazePAM (KLONOPIN) 1 MG tablet  ?  Sig: Take 1 tablet (1 mg total) by mouth daily as needed for anxiety.  ?  Dispense:  20 tablet  ?  Refill:  1  ? glucose blood (ONETOUCH ULTRA) test strip  ?  Sig: USE AS DIRECTED TWICE DAILY TO CHECK BLOOD SUGAR  ?   Dispense:  100 each  ?  Refill:  PRN  ? ? ?Return in about 3 months (around 01/21/2022) for HTN/DM. ? ? ? ?This visit occurred during the SARS-CoV-2 public health emergency.  Safety protocols were in place, including screening questions prior to the visit, additional usage of staff PPE, and extensive cleaning of exam room while observing appropriate contact time as indicated for disinfecting solutions.  ? ?

## 2021-10-22 LAB — HM DIABETES EYE EXAM

## 2021-10-25 ENCOUNTER — Encounter: Payer: Self-pay | Admitting: Family Medicine

## 2021-10-27 ENCOUNTER — Encounter: Payer: Self-pay | Admitting: Family Medicine

## 2021-10-29 ENCOUNTER — Encounter: Payer: Self-pay | Admitting: Family Medicine

## 2021-10-29 ENCOUNTER — Other Ambulatory Visit: Payer: Self-pay | Admitting: Family Medicine

## 2021-10-29 ENCOUNTER — Telehealth: Payer: Self-pay

## 2021-10-29 DIAGNOSIS — M25561 Pain in right knee: Secondary | ICD-10-CM

## 2021-10-29 NOTE — Telephone Encounter (Signed)
Message Mykel to let her know order was placed.

## 2021-10-29 NOTE — Telephone Encounter (Signed)
Call to [place].    She reports that her pain and swelling is in the right knee. Reports the pain is and aching sensation.    She has used Aleve, ice and elevation. All are helpful while she is using it, but the pain comes back.     Confirmed she is able to retrieve orders from My Chart.

## 2021-10-30 ENCOUNTER — Other Ambulatory Visit: Payer: Self-pay

## 2021-10-30 DIAGNOSIS — M25561 Pain in right knee: Secondary | ICD-10-CM

## 2021-10-31 ENCOUNTER — Encounter: Payer: Self-pay | Admitting: Family Medicine

## 2021-11-12 ENCOUNTER — Ambulatory Visit (INDEPENDENT_AMBULATORY_CARE_PROVIDER_SITE_OTHER): Payer: BC Managed Care – PPO | Admitting: Physician Assistant

## 2021-11-12 VITALS — BP 153/85 | HR 96 | Ht 65.0 in | Wt 176.0 lb

## 2021-11-12 DIAGNOSIS — M7061 Trochanteric bursitis, right hip: Secondary | ICD-10-CM | POA: Diagnosis not present

## 2021-11-12 DIAGNOSIS — M7062 Trochanteric bursitis, left hip: Secondary | ICD-10-CM

## 2021-11-12 MED ORDER — MELOXICAM 15 MG PO TABS: 15.0000 mg | ORAL_TABLET | Freq: Every day | ORAL | 0 refills | Status: AC

## 2021-11-12 MED ORDER — TRAMADOL HCL 50 MG PO TABS
50.0000 mg | ORAL_TABLET | Freq: Three times a day (TID) | ORAL | 0 refills | Status: DC | PRN
Start: 2021-11-12 — End: 2022-05-06

## 2021-11-12 NOTE — Progress Notes (Signed)
? ?Subjective:  ? ? Patient ID: [name] Hurley, female    DOB: 03-Apr-1971, 51 y.o.   MRN: [id number] ? ?HPI ?Pt is a 51 yo female with T2DM, HTN and hx of bursitis who presents to the clinic with bilateral hip and low back pain that radiates down the side of her leg on the right to the knee. Symptoms started mild and have progressively gotten worse. They have been ongoing for about 1 week severely. Right is worse than left. No known injury but she has been walking up and down the steps in her sons apartment a lot. She has tried gabapentin, ibuprofen, tylenol, heating and ice with little relief. She remembers having bursitis before and getting a shot in the hip and it helped a lot.  ? ?.. ?Active Ambulatory Problems  ?  Diagnosis Date Noted  ? 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 Muscle cramp 10/17/2014  ? Dyslipidemia 09/19/2015  ? Cigarette nicotine dependence without complication 11/19/2017  ? Essential hypertension 11/19/2017  ? Bursitis of left hip 09/29/2018  ? Anxiety with depression 09/06/2019  ? Hordeolum 02/07/2021  ? Exposure to COVID-19 virus 05/19/2021  ? Greater trochanteric bursitis of both hips 11/12/2021  ? ?Resolved Ambulatory Problems  ?  Diagnosis Date Noted  ? Cough 04/25/2015  ? Shoulder impingement syndrome, right 11/19/2017  ? Left hip pain 09/29/2018  ? Open wound of toe 02/09/2019  ? Animal scratch 02/09/2019  ? ?Past Medical History:  ?Diagnosis Date  ? Diabetes mellitus   ? Dyspareunia   ? Elevated serum creatinine   ? Hypertension   ? Sciatica   ? ? ? ?Review of Systems ?See HPI.  ?   ?Objective:  ? Physical Exam ?Vitals reviewed.  ?Constitutional:   ?   Appearance: Normal appearance. She is obese.  ?HENT:  ?   Head: Normocephalic.  ?Cardiovascular:  ?   Rate and Rhythm: Normal rate.  ?Pulmonary:  ?   Effort: Pulmonary effort is normal.  ?Musculoskeletal:  ?   Comments: Trouble walking and with all ROM of right hip and limited ROM of left hip due to  pain ?Strength right leg decreased due to pain ?Pain with right SLR. ?No tenderness over lumbar spine to palpation ?Severe tenderness over right greater trochanter and mild tenderness over left greater trochanter  ?Neurological:  ?   General: No focal deficit present.  ?   Mental Status: She is alert and oriented to person, place, and time.  ?Psychiatric:     ?   Mood and Affect: Mood normal.  ? ? ?Aspiration/Injection Procedure Note ?[name] Hurley ?[id number] ?[birth year] ? ?Procedure: Injection ?Indications: pain ? ?Procedure Details ?Consent: Risks of procedure as well as the alternatives and risks of each were explained to the (patient/caregiver).  Consent for procedure obtained. ?Time Out: Verified patient identification, verified procedure, site/side was marked, verified correct patient position, special equipment/implants available, medications/allergies/relevent history reviewed, required imaging and test results available.  Performed  ? ?Depo medrol 40mg  69mL and 12ml of lidocaine 2 percent no epi in right hip bursa ?A sterile dressing was applied. ? ?Patient did tolerate procedure well. ? ?10m ? ? ? ?   ?Assessment & Plan:  ?..[name] Hurley was seen today for back pain. ? ?Diagnoses and all orders for this visit: ? ?Greater trochanteric bursitis of both hips ?-     traMADol (ULTRAM) 50 MG tablet; Take 1 tablet (50 mg total) by mouth every 8 (eight) hours as  needed for moderate pain. ?-     meloxicam (MOBIC) 15 MG tablet; Take 1 tablet (15 mg total) by mouth daily. ? ? ?Injection in right hip bursa today ?Start mobic tomorrow and tramadol for break through pain ?Ice hip ?HO given with exercises ?Follow up with PcP in 2 weeks ? ?

## 2021-11-12 NOTE — Patient Instructions (Signed)
Hip Bursitis  Hip bursitis is the swelling of one or more of the fluid-filled sacs (bursae) in the hip joint. The hip bursae absorb shocks and prevent bones from rubbing against each other. If a bursa becomes irritated, it can fill with extra fluid and become inflamed. Hip bursitis can cause mild to moderate pain, and symptoms often come and go over time. What are the causes? This condition results from increased friction between the hip bones and the tendons around the hip joint. This condition can happen if you: Overuse your hip muscles. Injure your hip. Have weak buttocks muscles. Have bone spurs. Have an infection. In some cases, the cause may not be known. What increases the risk? You are more likely to develop this condition if: You injured your hip previously or had hip surgery. You have a medical condition, such as arthritis, gout, diabetes, or thyroid disease. You have spine problems. You have one leg that is shorter than the other. You participate in athletic activities that include repetitive motion, like running. You participate in sports where there is a risk of injury or falling, such as football, martial arts, or skiing. What are the signs or symptoms? Symptoms may come and go, and they often include: Pain in the hip or groin area. Pain may get worse with movement. Tenderness and swelling of the hip. In rare cases, the bursa may become infected. If this happens, you may get a fever, as well as warmth and redness in the hip area. How is this diagnosed? This condition may be diagnosed based on: Your symptoms. Your medical history. A physical exam. Imaging tests, such as: X-rays to check your bones. MRI or ultrasound to check your tendons and muscles. Bone scan. How is this treated? This condition is treated by resting, icing, applying pressure (compression), and raising (elevating) the injured area. This is called RICE treatment. In some cases, RICE treatment may not  be enough to make your symptoms go away. Treatment may also include: Using crutches, a cane, or a walker to decrease the strain on your hip. Taking medicine to help with swelling and pain. Getting a shot of cortisone medicine near the affected area to reduce swelling and pain. Taking antibiotic medicines if there is an infection. Draining fluid out of the bursa to help relieve swelling and pain. Having surgery to remove a damaged or infected bursa. This is rare. Long-term treatment may include: Physical therapy exercises for strength and flexibility. Identifying the cause of your bursitis to prevent future episodes. Lifestyle changes, such as weight loss, to reduce the strain on the hip. Follow these instructions at home: Managing pain, stiffness, and swelling     If directed, put ice on the affected area. To do this: Put ice in a plastic bag. Place a towel between your skin and the bag. Leave the ice on for 20 minutes, 2-3 times a day. Remove the ice if your skin turns bright red. This is very important. If you cannot feel pain, heat, or cold, you have a greater risk of damage to the area. Elevate your hip as much as you can without feeling pain. To do this, put a pillow under your hips while you lie down. If directed, apply heat to the affected area as often as told by your health care provider. Use the heat source that your health care provider recommends, such as a moist heat pack or a heating pad. Place a towel between your skin and the heat source. Leave the heat   on for 20-30 minutes. Remove the heat if your skin turns bright red. This is especially important if you are unable to feel pain, heat, or cold. You may have a greater risk of getting burned. Activity Do not use your hip to support your body weight until your health care provider says that you can. Use crutches, a cane, or a walker as told by your health care provider. If the affected leg is one that you use to drive, ask  your health care provider if it is safe to drive. Rest and protect your hip as much as possible until your pain and swelling get better. Return to your normal activities as told by your health care provider. Ask your health care provider what activities are safe for you. Do exercises as told by your health care provider. General instructions Take over-the-counter and prescription medicines only as told by your health care provider. Gently massage and stretch your injured area as often as is comfortable. Wear compression wraps only as told by your health care provider. If one of your legs is shorter than the other, get fitted for a shoe insert or orthotic. Your health care provider or physical therapist can tell you where to find these items and what size you need. Maintain a healthy weight. Follow instructions from your health care provider for weight control. These may include dietary restrictions. Keep all follow-up visits. This is important. How is this prevented? Exercise regularly or as told by your health care provider. Wear supportive footwear that is appropriate for your sport and daily activities. Warm up and stretch before being active. Cool down and stretch after being active. Take breaks regularly from repetitive activity. If an activity irritates your hip or causes pain, avoid the activity as much as possible. Avoid sitting down for long periods at a time. Where to find more information American [employer]: orthoinfo.aaos.org Contact a health care provider if: You have a fever. You develop new symptoms. You have trouble walking or doing everyday activities. You have pain that gets worse or does not get better with medicine. You develop red skin or a feeling of warmth in your hip area. Get help right away if: You cannot move your hip. You have severe pain. You cannot control the muscles in your feet. Summary Hip bursitis is the swelling of one or more  of the fluid-filled sacs (bursae) in the hip joint. Hip bursitis can cause hip or groin pain, and symptoms often come and go over time. This condition is often treated by resting, icing, applying pressure (compression), and raising (elevating) the injured area. Other treatments may be needed. This information is not intended to replace advice given to you by your health care provider. Make sure you discuss any questions you have with your health care provider. Document Revised: 07/09/2021 Document Reviewed: 07/09/2021 Elsevier Patient Education  2023 [employer]vier Inc.  

## 2021-11-18 ENCOUNTER — Other Ambulatory Visit: Payer: Self-pay | Admitting: Family Medicine

## 2021-11-18 ENCOUNTER — Ambulatory Visit (INDEPENDENT_AMBULATORY_CARE_PROVIDER_SITE_OTHER): Payer: BC Managed Care – PPO | Admitting: Physician Assistant

## 2021-11-18 VITALS — BP 170/80 | HR 84 | Ht 65.0 in | Wt 174.0 lb

## 2021-11-18 DIAGNOSIS — I10 Essential (primary) hypertension: Secondary | ICD-10-CM | POA: Diagnosis not present

## 2021-11-18 MED ORDER — HYDROCHLOROTHIAZIDE 12.5 MG PO CAPS: 12.5000 mg | ORAL_CAPSULE | Freq: Every day | ORAL | 0 refills | Status: DC

## 2021-11-18 NOTE — Progress Notes (Signed)
Patient is here for blood pressure check.  ? ?Previous BP was 152/80 ? ?1st BP today: 170/80 ? ?2nd BP today (after 10  minutes): 147/79 ? ?[name] Hurley admits to missing 4 doses of medication due to illness. She was unable to keep food and drink down. States she's been taking the medications consistently for the past 3 days. Denies chest pain, dizziness, shortness of breath, severe headache, or nosebleeds. ? ?Added HCTZ 12.5mg . Advised the [name] Hurley. She requested that HCTZ medication be held until her appt on 5/3. ?

## 2021-11-18 NOTE — Progress Notes (Signed)
BP still elevated. Added HCTZ daily in the morning. Follow up with PCP in 1-2 weeks.  ?

## 2021-11-27 ENCOUNTER — Ambulatory Visit: Payer: Commercial Managed Care - POS | Admitting: Family Medicine

## 2021-11-27 ENCOUNTER — Encounter: Payer: Self-pay | Admitting: Family Medicine

## 2021-11-27 ENCOUNTER — Ambulatory Visit: Payer: BC Managed Care – PPO | Admitting: Family Medicine

## 2021-11-27 VITALS — BP 143/85 | HR 58 | Temp 98.1°F | Resp 14

## 2021-11-27 DIAGNOSIS — M545 Low back pain, unspecified: Secondary | ICD-10-CM

## 2021-11-27 DIAGNOSIS — G8929 Other chronic pain: Secondary | ICD-10-CM

## 2021-11-27 DIAGNOSIS — M25561 Pain in right knee: Secondary | ICD-10-CM

## 2021-11-27 DIAGNOSIS — B351 Tinea unguium: Secondary | ICD-10-CM

## 2021-11-27 MED ORDER — CICLOPIROX 8 % EX SOLN
Freq: Every evening | CUTANEOUS | 0 refills | Status: DC
Start: 2021-11-27 — End: 2022-09-01

## 2021-11-27 NOTE — Progress Notes (Signed)
Subjective:      Date: 11/27/2021 12:01 AM   Patient ID: Tammy Larson is a 51 y.o. female.    Chief Complaint:  Chief Complaint   Patient presents with    Pain     Right knee-with intermittent swelling and stiffness  Low back- lumbar area on and off       HPI:  HPI  Tammy Larson is a 51 y.o. female who presents with the following complaints:     Right knee pain x 1 month - she has intermittent swelling with prolonged walking and stiffness with initial movement such as with standing after prolonged sitting.  She has not had the sensation of knee "giving away". Pain with going up and down stairs and squatting.   No identifiable incting event leading to onset of pain - but she had been walking extensively in [facility] and had gone dancing the following night.  She did have improvement of the pain but has had mild swelling return with pain noted especially with initial movements    2.    Acute onset of  low back pain -     - over the last several weeks.  Had pain in similar region about 2 years prior for a brief period- addressed by chiropractry  No imaging studies performed    3.   Left achilles pain for about 1 month     4.  HTN  Home BP- 123-135/79-82  No  adverse se      No problems updated.      Problem List:  Patient Active Problem List   Diagnosis    Uterine leiomyoma    Primary hypertension    Perimenopausal vasomotor symptoms       Current Medications:  Current Outpatient Medications   Medication Sig Dispense Refill    B Complex Vitamins (VITAMIN B COMPLEX) Tab Take 1 tablet by mouth daily      Biotin w/ Vitamins C & E (HAIR/SKIN/NAILS PO) Take 1 tablet by mouth daily      CALCIUM-MAGNESIUM-ZINC PO Take 1 tablet by mouth daily      Cholecalciferol (D3 PO) Take 5,000 IU by mouth daily      estradiol (CLIMARA) 0.05 MG/24HR Place 1 patch onto the skin once a week 4 patch 5    Fish Oil-Cholecalciferol (OMEGA-3 GUMMIES PO) Take 1,200 mg by mouth daily      fluticasone (FLONASE) 50 MCG/ACT nasal spray 1 spray by  Nasal route daily 9.9 mL 1    Probiotic Product (PROBIOTIC PO) Take 1 tablet by mouth daily      valsartan-hydroCHLOROthiazide (DIOVAN-HCT) 160-25 MG per tablet Take 1 tablet by mouth daily 90 tablet 1    ciclopirox (Penlac) 8 % solution Apply topically nightly 6 mL 0     No current facility-administered medications for this visit.       Allergies:  No Known Allergies    Past Medical History:  Past Medical History:   Diagnosis Date    Abnormal vision     eyeglasses    Anemia     Hypertension     Post-operative nausea and vomiting     apfel=3       Past Surgical History:  Past Surgical History:   Procedure Laterality Date    HYSTERECTOMY  2017    HYSTEROSCOPY, MYOSURE MORCELLATION N/A 12/23/2013    Procedure: Hysteroscopic Myomectomy with Myosure;  Surgeon: Waldon Merl, MD;  Location: [facility];  Service: Gynecology;  Laterality: N/A;    LAPAROSCOPY, DIAGNOSTIC         Family History:  Family History   Problem Relation Age of Onset    Hypertension Mother     Parkinsonism Father     Hypertension Brother     Cancer Paternal Aunt         Colon Cancer    Diabetes Paternal Uncle     Diabetes Paternal Grandmother        Social History:  Social History     Socioeconomic History    Marital status: Married   Tobacco Use    Smoking status: Never    Smokeless tobacco: Never   Vaping Use    Vaping status: Never Used   Substance and Sexual Activity    Alcohol use: Yes     Comment: weekends    Drug use: No        The following sections were reviewed this encounter by the provider:        ROS:  Review of Systems   Constitutional:  Negative for chills, fatigue and fever.   Respiratory:  Negative for chest tightness and shortness of breath.    Cardiovascular:  Negative for chest pain.   Musculoskeletal:  Positive for arthralgias (as per HPI) and back pain (as per hpi).   Skin:  Negative for rash and wound.        Objective:     Vitals:  BP 143/85 (BP Site: Left arm, Patient Position: Sitting, Cuff Size: Large)   Pulse (!)  58   Temp 98.1 F (36.7 C) (Oral)   Resp 14   LMP 12/11/2013   SpO2 97%     Physical Exam:  Physical Exam  Vitals and nursing note reviewed.   Constitutional:       General: She is not in acute distress.     Appearance: Normal appearance. She is not ill-appearing.   Cardiovascular:      Rate and Rhythm: Normal rate and regular rhythm.   Pulmonary:      Effort: Pulmonary effort is normal. No respiratory distress.      Breath sounds: Normal breath sounds.   Musculoskeletal:         General: No tenderness or signs of injury.        Arms:       Comments: - Right knee pain behind the patella. No ligamentous laxity.  +/-apprehension sign.  Some swelling noted   + pain at the Left achilles point of insertion  - FROM at the LS spine. No spinous processes tenderness. Minimal left lower back pain    Skin:     Comments: She has opaque discoloration of the nails of the big toes bilaterally    Neurological:      Mental Status: She is alert.      Cranial Nerves: No cranial nerve deficit.      Sensory: No sensory deficit.      Coordination: Coordination normal.      Gait: Gait normal.      Deep Tendon Reflexes: Reflexes normal.   Psychiatric:         Mood and Affect: Mood normal.         Behavior: Behavior normal.         Assessment/Plan:       1. Chronic pain of right knee  - Referral to Physical Therapy-[facility]; Future  Recommend NSAIDS with food  - avoid activity which exacerbate pain  Trial of PT  advised if persistent in  1 month or worsening- imaging studies encouraged    2. Acute left-sided low back pain without sciatica  Suspect exacerbated by gait changes  NSAIDs with food  Warm compresses to the affected region    3. Onychomycosis  - ciclopirox (Penlac) 8 % solution; Apply topically nightly  Dispense: 6 mL; Refill: 0  Nail changes c/w Onychomycosis    4. HTN  Controlled based on home BP readings- no medication changes at this time  Advised patient of the potential for BP increase on NSAIDs- hold medication  if BP consistently >140/90  Return in about 1 month (around 12/28/2021).  Follow up for persistent or worsening symptoms.    5  Achilles tendonitis  Comfortable supportive footwear  NSAIDs with food as above- follow up for persistent or worsening symptoms.      Heath Gold, MD

## 2021-12-01 ENCOUNTER — Encounter: Payer: Self-pay | Admitting: Family Medicine

## 2021-12-06 ENCOUNTER — Encounter: Payer: Self-pay | Admitting: Family Medicine

## 2021-12-31 ENCOUNTER — Other Ambulatory Visit: Payer: Self-pay | Admitting: Family Medicine

## 2022-01-21 ENCOUNTER — Ambulatory Visit: Payer: BC Managed Care – PPO | Admitting: Family Medicine

## 2022-02-03 ENCOUNTER — Encounter: Payer: Self-pay | Admitting: Family Medicine

## 2022-02-03 ENCOUNTER — Ambulatory Visit (INDEPENDENT_AMBULATORY_CARE_PROVIDER_SITE_OTHER): Payer: BC Managed Care – PPO | Admitting: Family Medicine

## 2022-02-03 VITALS — BP 134/77 | HR 102 | Ht 65.0 in | Wt 166.0 lb

## 2022-02-03 DIAGNOSIS — E119 Type 2 diabetes mellitus without complications: Secondary | ICD-10-CM | POA: Diagnosis not present

## 2022-02-03 DIAGNOSIS — Z794 Long term (current) use of insulin: Secondary | ICD-10-CM

## 2022-02-03 DIAGNOSIS — E11319 Type 2 diabetes mellitus with unspecified diabetic retinopathy without macular edema: Secondary | ICD-10-CM

## 2022-02-03 DIAGNOSIS — I10 Essential (primary) hypertension: Secondary | ICD-10-CM | POA: Diagnosis not present

## 2022-02-03 DIAGNOSIS — F418 Other specified anxiety disorders: Secondary | ICD-10-CM

## 2022-02-03 LAB — POCT GLYCOSYLATED HEMOGLOBIN (HGB A1C): HbA1c, POC (controlled diabetic range): 6.5 % (ref 0.0–7.0)

## 2022-02-03 LAB — POCT UA - MICROALBUMIN
Albumin/Creatinine Ratio, Urine, POC: >300
Creatinine, POC: 50 mg/dL
Microalbumin Ur, POC: 150 mg/L

## 2022-02-03 MED ORDER — TRESIBA FLEXTOUCH 100 UNIT/ML ~~LOC~~ SOPN: PEN_INJECTOR | SUBCUTANEOUS | 1 refills | Status: DC

## 2022-02-03 MED ORDER — SEMAGLUTIDE (1 MG/DOSE) 4 MG/3ML ~~LOC~~ SOPN
1.0000 mg | PEN_INJECTOR | SUBCUTANEOUS | 1 refills | Status: AC
Start: 2022-02-03 — End: 2022-05-04

## 2022-02-03 NOTE — Assessment & Plan Note (Signed)
She is having nausea with 2mg  Ozempic, reducing to 1mg .  She may need to increase tresiba to 25 units to compensate for decrease in ozempic.  Follow up in 3-4 months.

## 2022-02-03 NOTE — Assessment & Plan Note (Signed)
Anxiety remains stable at this time.

## 2022-02-03 NOTE — Patient Instructions (Addendum)
Reduce ozempic to 1mg  weekly.  You may need to increase the tresiba to 25 units, keep an eye on blood sugar.  Check BP at home.  See me again in 3-4 months.

## 2022-02-03 NOTE — Assessment & Plan Note (Signed)
BP is looking ok on recheck today.  Continue current medications for management of HTN.

## 2022-02-03 NOTE — Progress Notes (Signed)
[name] Hurley - 51 y.o. female MRN [id number]  Date of birth: 03-27-1971  Subjective Chief Complaint  Patient presents with   Diabetes    HPI [name] Hurley is a 51 y.o. female here today for follow up visit.     Current management of her diabetes includes ozempic, metformin and tresiba.  She is having increased nausea at the 2mg  dose of Ozempic.  Glucose at home has been in the lower one hundreds.  A1c improved today to 6.5%.    BP is elevated on initial check today.  She is taking amlodipine, losartan, and HCTZ.  Tolerating well.  She denies chest pain, shortness of breath, palpitations, headache or vision changes.   Anxiety is stable at this time.  Used clonazepam only a couple of times.   ROS:  A comprehensive ROS was completed and negative except as noted per HPI    No Known Allergies  Past Medical History:  Diagnosis Date   Diabetes mellitus    Dyspareunia    Elevated serum creatinine    Hypertension    Sciatica     Past Surgical History:  Procedure Laterality Date   CESAREAN SECTION     paragard     removal 7-16 & reinsertion 02-13-15    Social History   Socioeconomic History   Marital status: Married    Spouse name: Not on file   Number of children: 4   Years of education: 14   Highest education level: Not on file  Occupational History   Occupation: Nutritition   Tobacco Use   Smoking status: Every Day    Packs/day: 1.00    Years: 22.00    Total pack years: 22.00    Types: Cigarettes   Smokeless tobacco: Never  Substance and Sexual Activity   Alcohol use: Not Currently   Drug use: No   Sexual activity: Yes    Partners: Male    Birth control/protection: I.U.D.  Other Topics Concern   Not on file  Social History Narrative   Born [place] raised [place] 10-03-1982. Fun: Sleep   Denies any religious beliefs effecting health care.    Social Determinants of Health   Financial Resource Strain: Not on file  Food Insecurity: Not on file  Transportation Needs: Not  on file  Physical Activity: Not on file  Stress: Not on file  Social Connections: Not on file    Family History  Problem Relation Age of Onset   Hypertension Father    COPD Father    Emphysema Father    Diabetes Maternal Aunt    Hypertension Mother    Hyperlipidemia Mother    Colon cancer Mother    COPD Paternal Grandfather    Hypertension Brother    Heart disease Neg Hx     Health Maintenance  Topic Date Due   COVID-19 Vaccine (4 - Pfizer series) 03/28/2022 (Originally 07/09/2020)   MAMMOGRAM  10/22/2022 (Originally 10/12/2021)   HIV Screening  10/22/2022 (Originally [birth year]/1984)   INFLUENZA VACCINE  02/25/2022   Diabetic kidney evaluation - GFR measurement  05/13/2022   FOOT EXAM  05/13/2022   HEMOGLOBIN A1C  08/06/2022   OPHTHALMOLOGY EXAM  10/23/2022   Diabetic kidney evaluation - Urine ACR  02/04/2023   Fecal DNA (Cologuard)  03/08/2023   PAP SMEAR-Modifier  04/12/2023   TETANUS/TDAP  02/19/2030   Hepatitis C Screening  Completed   Zoster Vaccines- Shingrix  Completed   HPV VACCINES  Aged Out     ----------------------------------------------------------------------------------------------------------------------------------------------------------------------------------------------------------------- Physical Exam BP  134/77 (BP Location: Left Arm, Patient Position: Sitting, Cuff Size: Normal)   Pulse (!) 102   Ht 5\' 5"  (1.651 m)   Wt 166 lb (75.3 kg)   SpO2 96%   BMI 27.62 kg/m   Physical Exam Constitutional:      Appearance: Normal appearance.  HENT:     Head: Normocephalic and atraumatic.  Eyes:     General: No scleral icterus. Cardiovascular:     Rate and Rhythm: Normal rate and regular rhythm.  Pulmonary:     Effort: Pulmonary effort is normal.     Breath sounds: Normal breath sounds.  Musculoskeletal:     Cervical back: Neck supple.  Neurological:     General: No focal deficit present.     Mental Status: She is alert.  Psychiatric:         Mood and Affect: Mood normal.        Behavior: Behavior normal.     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  She may need to increase tresiba to 25 units to compensate for decrease in ozempic.  Follow up in 3-4 months.   Essential hypertension BP is looking ok on recheck today.  Continue current medications for management of HTN.  Anxiety with depression Anxiety remains stable at this time.     Meds ordered this encounter  Medications   insulin degludec (TRESIBA FLEXTOUCH) 100 UNIT/ML FlexTouch Pen    Sig: Inject 25 units daily.    Dispense:  15 mL    Refill:  1   Semaglutide, 1 MG/DOSE, 4 MG/3ML SOPN    Sig: Inject 1 mg into the skin once a week.    Dispense:  9 mL    Refill:  1    Return in about 3 months (around 05/06/2022) for HTN/T2DM.    This visit occurred during the SARS-CoV-2 public health emergency.  Safety protocols were in place, including screening questions prior to the visit, additional usage of staff PPE, and extensive cleaning of exam room while observing appropriate contact time as indicated for disinfecting solutions.

## 2022-02-28 ENCOUNTER — Other Ambulatory Visit: Payer: Self-pay | Admitting: Family Medicine

## 2022-03-03 ENCOUNTER — Other Ambulatory Visit: Payer: Self-pay | Admitting: Family Medicine

## 2022-03-03 DIAGNOSIS — N951 Menopausal and female climacteric states: Secondary | ICD-10-CM

## 2022-03-29 ENCOUNTER — Other Ambulatory Visit: Payer: Self-pay | Admitting: Family Medicine

## 2022-03-29 DIAGNOSIS — I10 Essential (primary) hypertension: Secondary | ICD-10-CM

## 2022-04-08 ENCOUNTER — Other Ambulatory Visit: Payer: Self-pay | Admitting: Family Medicine

## 2022-04-28 NOTE — Progress Notes (Signed)
51 y.o. G24P1001 Married Black or [place] American Not Hispanic or Latino female here for annual exam. She has a paragard IUD, placed in 2016.  No cycle in over a year.  No hot flashes or night sweats.   She is having Painful intercourse on entry, not using a lubricant   H/O HSV, on suppression no outbreak in the last year.   She is on low dose ditropan, no leakage, doing great.   No LMP recorded. (Menstrual status: IUD).          Sexually active: Yes.    The current method of family planning is IUD.    Exercising: Yes.     Walking  Smoker:  yes 1 pack a day   Health Maintenance: Pap:   04/11/20 WNL Hr HPV neg  02/10/17 WNL  History of abnormal Pap:  no MMG:  10/14/19 density B Bi-rads 1 neg  BMD:   n/a Colonoscopy: Cologuard 03/07/20 negative. 2005 neg per patient  TDaP:02/20/20   Gardasil: n/a   reports that she has been smoking cigarettes. She has a 22.00 pack-year smoking history. She has never used smokeless tobacco. She reports that she does not currently use alcohol. She reports that she does not use drugs. 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 1 biologic and 3 adopted kids. Has 2 grandchildren and one on the way.   Past Medical History:  Diagnosis Date   Diabetes mellitus    Dyspareunia    Elevated serum creatinine    Hypertension    Sciatica     Past Surgical History:  Procedure Laterality Date   CESAREAN SECTION     paragard     removal 7-16 & reinsertion 02-13-15    Current Outpatient Medications  Medication Sig Dispense Refill   albuterol (VENTOLIN HFA) 108 (90 Base) MCG/ACT inhaler Inhale 2 puffs into the lungs every 6 (six) hours as needed for wheezing or shortness of breath. 8 g 0   clobetasol ointment (TEMOVATE) 0.05 % Apply 1 application topically 2 (two) times daily. Apply a pea sized amount twice daily for up to 2 weeks 30 g 0   clonazePAM (KLONOPIN) 1 MG tablet Take 1 tablet (1 mg total) by mouth daily as needed for anxiety. 20 tablet 1    erythromycin ophthalmic ointment Place 1 application into the right eye at bedtime. 3.5 g 0   Fluticasone Furoate-Vilanterol (BREO ELLIPTA) 100-25 MCG/INH AEPB Inhale 1 puff into the lungs daily as needed. 28 each 0   glucose blood (ONETOUCH ULTRA) test strip USE AS DIRECTED TWICE DAILY TO CHECK BLOOD SUGAR 100 each PRN   hydrochlorothiazide (MICROZIDE) 12.5 MG capsule Take 1 capsule (12.5 mg total) by mouth daily. 30 capsule 0   hydrOXYzine (ATARAX) 10 MG tablet Take 1 tablet (10 mg total) by mouth 3 (three) times daily as needed for itching. 20 tablet 0   insulin degludec (TRESIBA FLEXTOUCH) 100 UNIT/ML FlexTouch Pen Inject 25 units daily. 15 mL 1   lidocaine (XYLOCAINE) 5 % ointment Apply 1 application topically 4 (four) times daily as needed. 30 g 1   losartan (COZAAR) 100 MG tablet Take 1 tablet (100 mg total) by mouth daily. 90 tablet 3   meloxicam (MOBIC) 15 MG tablet Take 1 tablet (15 mg total) by mouth daily. 30 tablet 0   metFORMIN (GLUCOPHAGE) 1000 MG tablet TAKE 1 TABLET BY MOUTH TWICE DAILY WITH A MEAL 180 tablet 0   mupirocin ointment (BACTROBAN) 2 % Apply 1 application topically  2 (two) times daily. To leg 22 g 0   oxybutynin (DITROPAN XL) 5 MG 24 hr tablet Take 1 tablet (5 mg total) by mouth at bedtime. 90 tablet 3   pravastatin (PRAVACHOL) 10 MG tablet Take 1 tablet (10 mg total) by mouth daily. 90 tablet 2   Semaglutide, 1 MG/DOSE, 4 MG/3ML SOPN Inject 1 mg into the skin once a week. 9 mL 1   traMADol (ULTRAM) 50 MG tablet Take 1 tablet (50 mg total) by mouth every 8 (eight) hours as needed for moderate pain. 21 tablet 0   valACYclovir (VALTREX) 500 MG tablet TAKE 1 TABLET BY MOUTH ONCE DAILY, increase to one tablet po BID for 3 days with an outbreak. 90 tablet 4   amLODipine (NORVASC) 10 MG tablet Take 1 tablet (10 mg total) by mouth daily. 90 tablet 2   No current facility-administered medications for this visit.    Family History  Problem Relation Age of Onset    Hypertension Father    COPD Father    Emphysema Father    Diabetes Maternal Aunt    Hypertension Mother    Hyperlipidemia Mother    Colon cancer Mother    COPD Paternal Grandfather    Hypertension Brother    Heart disease Neg Hx     Review of Systems  All other systems reviewed and are negative.   Exam:   BP 138/72   Pulse 66   Ht 5\' 4"  (1.626 m)   Wt 168 lb (76.2 kg)   SpO2 100%   BMI 28.84 kg/m   Weight change: @WEIGHTCHANGE @ Height:   Height: 5\' 4"  (162.6 cm)  Ht Readings from Last 3 Encounters:  04/29/22 5\' 4"  (1.626 m)  02/03/22 5\' 5"  (1.651 m)  11/18/21 5\' 5"  (1.651 m)    General appearance: alert, cooperative and appears stated age Head: Normocephalic, without obvious abnormality, atraumatic Neck: no adenopathy, supple, symmetrical, trachea midline and thyroid normal to inspection and palpation Lungs: clear to auscultation bilaterally Cardiovascular: regular rate and rhythm Breasts: normal appearance, no masses or tenderness Abdomen: soft, non-tender; non distended,  no masses,  no organomegaly Extremities: extremities normal, atraumatic, no cyanosis or edema Skin: Skin color, texture, turgor normal. No rashes or lesions Lymph nodes: Cervical, supraclavicular, and axillary nodes normal. No abnormal inguinal nodes palpated Neurologic: Grossly normal   Pelvic: External genitalia:  no lesions              Urethra:  normal appearing urethra with no masses, tenderness or lesions              Bartholins and Skenes: normal                 Vagina: mildly atrophic appearing vagina with a slight increase in watery, vaginal discharge              Cervix: no lesions, IUD string 2 cm, IUD removed with ringed forceps               Bimanual Exam:  Uterus:   no masses or tenderness              Adnexa: no mass, fullness, tenderness               Rectovaginal: Confirms               Anus:  normal sphincter tone, no lesions  06/29/22 chaperoned for the exam.  1. Well  woman exam Discussed breast self exam  Discussed calcium and vit D intake Mammogram overdue, she will schedule Colon cancer screening next year Labs with primary   2. Urge incontinence Doing well on medication - oxybutynin (DITROPAN XL) 5 MG 24 hr tablet; Take 1 tablet (5 mg total) by mouth at bedtime.  Dispense: 90 tablet; Refill: 3  3. Herpes simplex infection of genitourinary system Doing well on suppression - valACYclovir (VALTREX) 500 MG tablet; TAKE 1 TABLET BY MOUTH ONCE DAILY, increase to one tablet po BID for 3 days with an outbreak.  Dispense: 90 tablet; Refill: 4  4. Dyspareunia, female - [place], YEAST, CLUE -try uberlube -If uberlube doesn't work will discuss vaginal estrogen  5. Vaginal discharge - WET PREP FOR TRICH, YEAST, CLUE: negative  6. Encounter for IUD removal IUD removed

## 2022-04-29 ENCOUNTER — Ambulatory Visit (INDEPENDENT_AMBULATORY_CARE_PROVIDER_SITE_OTHER): Payer: BC Managed Care – PPO | Admitting: Obstetrics and Gynecology

## 2022-04-29 ENCOUNTER — Encounter: Payer: Self-pay | Admitting: Obstetrics and Gynecology

## 2022-04-29 VITALS — BP 138/72 | HR 66 | Ht 64.0 in | Wt 168.0 lb

## 2022-04-29 DIAGNOSIS — Z30432 Encounter for removal of intrauterine contraceptive device: Secondary | ICD-10-CM

## 2022-04-29 DIAGNOSIS — N941 Unspecified dyspareunia: Secondary | ICD-10-CM | POA: Diagnosis not present

## 2022-04-29 DIAGNOSIS — Z01419 Encounter for gynecological examination (general) (routine) without abnormal findings: Secondary | ICD-10-CM | POA: Diagnosis not present

## 2022-04-29 DIAGNOSIS — N3941 Urge incontinence: Secondary | ICD-10-CM

## 2022-04-29 DIAGNOSIS — A6000 Herpesviral infection of urogenital system, unspecified: Secondary | ICD-10-CM

## 2022-04-29 DIAGNOSIS — N898 Other specified noninflammatory disorders of vagina: Secondary | ICD-10-CM

## 2022-04-29 LAB — WET PREP FOR TRICH, YEAST, CLUE

## 2022-04-29 MED ORDER — OXYBUTYNIN CHLORIDE ER 5 MG PO TB24: 5.0000 mg | ORAL_TABLET | Freq: Every day | ORAL | 3 refills | Status: DC

## 2022-04-29 MED ORDER — VALACYCLOVIR HCL 500 MG PO TABS: ORAL_TABLET | ORAL | 4 refills | Status: DC

## 2022-04-29 NOTE — Patient Instructions (Addendum)

## 2022-05-06 ENCOUNTER — Encounter: Payer: Self-pay | Admitting: Family Medicine

## 2022-05-06 ENCOUNTER — Ambulatory Visit: Payer: BC Managed Care – PPO | Admitting: Family Medicine

## 2022-05-06 VITALS — BP 174/79 | HR 94 | Ht 64.0 in | Wt 163.0 lb

## 2022-05-06 DIAGNOSIS — E119 Type 2 diabetes mellitus without complications: Secondary | ICD-10-CM

## 2022-05-06 DIAGNOSIS — Z23 Encounter for immunization: Secondary | ICD-10-CM | POA: Diagnosis not present

## 2022-05-06 DIAGNOSIS — F418 Other specified anxiety disorders: Secondary | ICD-10-CM

## 2022-05-06 DIAGNOSIS — F17210 Nicotine dependence, cigarettes, uncomplicated: Secondary | ICD-10-CM

## 2022-05-06 DIAGNOSIS — Z794 Long term (current) use of insulin: Secondary | ICD-10-CM

## 2022-05-06 DIAGNOSIS — E11319 Type 2 diabetes mellitus with unspecified diabetic retinopathy without macular edema: Secondary | ICD-10-CM | POA: Diagnosis not present

## 2022-05-06 DIAGNOSIS — I10 Essential (primary) hypertension: Secondary | ICD-10-CM

## 2022-05-06 LAB — POCT GLYCOSYLATED HEMOGLOBIN (HGB A1C): HbA1c, POC (controlled diabetic range): 7.7 % — AB (ref 0.0–7.0)

## 2022-05-06 MED ORDER — AMLODIPINE BESYLATE 10 MG PO TABS: 10.0000 mg | ORAL_TABLET | Freq: Every day | ORAL | 1 refills | Status: DC

## 2022-05-06 NOTE — Progress Notes (Signed)
[name] Hurley - 51 y.o. female MRN [id number]  Date of birth: [birth year]  Subjective Chief Complaint  Patient presents with   Hypertension   Diabetes    HPI [name] Hurley is a 51 year old female here today for follow-up visit.  She reports she is feeling well.  She is having increased nausea with Ozempic at 2 mg strength during her last visit.  We decreased this to 1 mg.  She is no longer having severe nausea or vomiting but does continue to have some mild nausea.  This is tolerable.  She does relate this continues to help with her blood sugars.  Additionally she continues on 20 units of Tresiba.  Blood pressure is elevated today.  Think she may have whitecoat hypertension.  Blood pressures at home have been better controlled.  She is taking medications as directed.  She denies symptoms related to hypertension including chest pain, shortness of breath, palpitations, headaches or vision changes.  ROS:  A comprehensive ROS was completed and negative except as noted per HPI  No Known Allergies  Past Medical History:  Diagnosis Date   Diabetes mellitus    Dyspareunia    Elevated serum creatinine    Hypertension    Sciatica     Past Surgical History:  Procedure Laterality Date   CESAREAN SECTION     paragard     removal 7-16 & reinsertion 02-13-15    Social History   Socioeconomic History   Marital status: Married    Spouse name: Not on file   Number of children: 4   Years of education: 14   Highest education level: Not on file  Occupational History   Occupation: Nutritition   Tobacco Use   Smoking status: Every Day    Packs/day: 1.00    Years: 22.00    Total pack years: 22.00    Types: Cigarettes   Smokeless tobacco: Never  Substance and Sexual Activity   Alcohol use: Not Currently   Drug use: No   Sexual activity: Yes    Partners: Male    Birth control/protection: I.U.D.  Other Topics Concern   Not on file  Social History Narrative   Born and raised in [place].  Fun: Sleep   Denies any religious beliefs effecting health care.    Social Determinants of Health   Financial Resource Strain: Not on file  Food Insecurity: Not on file  Transportation Needs: Not on file  Physical Activity: Not on file  Stress: Not on file  Social Connections: Not on file    Family History  Problem Relation Age of Onset   Hypertension Father    COPD Father    Emphysema Father    Diabetes Maternal Aunt    Hypertension Mother    Hyperlipidemia Mother    Colon cancer Mother    COPD Paternal Grandfather    Hypertension Brother    Heart disease Neg Hx     Health Maintenance  Topic Date Due   Diabetic kidney evaluation - GFR measurement  05/13/2022   COVID-19 Vaccine (4 - Pfizer series) 08/28/2022 (Originally 07/09/2020)   MAMMOGRAM  10/22/2022 (Originally 10/12/2021)   HIV Screening  10/22/2022 (Originally 01/05/1984)   FOOT EXAM  05/13/2022   OPHTHALMOLOGY EXAM  10/23/2022   HEMOGLOBIN A1C  11/05/2022   Diabetic kidney evaluation - Urine ACR  02/04/2023   Fecal DNA (Cologuard)  03/08/2023   PAP SMEAR-Modifier  04/12/2023   TETANUS/TDAP  02/19/2030   INFLUENZA VACCINE  Completed   Hepatitis C  Screening  Completed   Zoster Vaccines- Shingrix  Completed   HPV VACCINES  Aged Out     ----------------------------------------------------------------------------------------------------------------------------------------------------------------------------------------------------------------- Physical Exam BP (!) 174/79 (BP Location: Left Arm, Patient Position: Sitting, Cuff Size: Normal)   Pulse 94   Ht 5\' 4"  (1.626 m)   Wt 163 lb (73.9 kg)   SpO2 97%   BMI 27.98 kg/m   Physical Exam Eyes:     General: No scleral icterus. Cardiovascular:     Rate and Rhythm: Normal rate and regular rhythm.  Pulmonary:     Effort: Pulmonary effort is normal.     Breath sounds: Normal breath sounds.  Musculoskeletal:     Cervical back: Neck supple.  Neurological:      Mental Status: She is alert.  Psychiatric:        Mood and Affect: Mood normal.        Behavior: Behavior normal.     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  Recommend continuing Ozempic at 1 mg.  Increase Tresiba to 25 units daily.  Essential hypertension Blood pressure is elevated today.  Reports readings at home have been better.  Asked her to check this at home over the next couple weeks and let me know what readings look like.  Anxiety with depression Stable at this time.  Cigarette nicotine dependence without complication Continues to smoke, not ready to quit at this time.  Counseling provided.   Meds ordered this encounter  Medications   amLODipine (NORVASC) 10 MG tablet    Sig: Take 1 tablet (10 mg total) by mouth daily.    Dispense:  90 tablet    Refill:  1    Return in about 4 months (around 09/06/2022) for HTN/T2DM.    This visit occurred during the SARS-CoV-2 public health emergency.  Safety protocols were in place, including screening questions prior to the visit, additional usage of staff PPE, and extensive cleaning of exam room while observing appropriate contact time as indicated for disinfecting solutions.

## 2022-05-06 NOTE — Patient Instructions (Signed)
Continue to work on changes to diet  Increase tresiba to 25 units.   See me again in 3-4 months.

## 2022-05-06 NOTE — Assessment & Plan Note (Signed)
Stable at this time 

## 2022-05-06 NOTE — Assessment & Plan Note (Signed)
Continues to smoke, not ready to quit at this time.  Counseling provided.

## 2022-05-06 NOTE — Assessment & Plan Note (Signed)
A1c has increased some since last visit.  Recommend continuing Ozempic at 1 mg.  Increase Tresiba to 25 units daily.

## 2022-05-06 NOTE — Assessment & Plan Note (Signed)
Blood pressure is elevated today.  Reports readings at home have been better.  Asked her to check this at home over the next couple weeks and let me know what readings look like.

## 2022-05-06 NOTE — Progress Notes (Addendum)
Patient returned call. She states since Friday night she has been experiencing some right lower abdominal pain as well as pain above her naval. Tender to touch. Pain was sharp when it first started but is now just an uncomfortable feeling that almost feels like gas pain. Patient denies any nausea, vomiting, diarrhea, or fever. She states stools are formed but color has changed to a lighter brown color. Patient has only taken Tylenol PM for her symptoms with minimal relief.  Per Dr. Lyndel Pleasure, transferred patient to [facility] staff to assist with scheduling an acute visit.

## 2022-05-06 NOTE — Progress Notes (Signed)
Called patient to triage, no answer. Left voicemail to return call.

## 2022-05-06 NOTE — Progress Notes (Signed)
Please help schedule an Acute appointment

## 2022-05-06 NOTE — Progress Notes (Signed)
Please let us schedule patient sooner. Advise to the ER for worsening symptoms

## 2022-05-07 ENCOUNTER — Encounter: Payer: Self-pay | Admitting: Family Medicine

## 2022-05-07 ENCOUNTER — Ambulatory Visit: Payer: Commercial Managed Care - POS | Admitting: Family Medicine

## 2022-05-07 VITALS — BP 130/87 | HR 58 | Temp 98.0°F | Resp 17 | Wt 168.8 lb

## 2022-05-07 DIAGNOSIS — R1031 Right lower quadrant pain: Secondary | ICD-10-CM

## 2022-05-07 DIAGNOSIS — R7989 Other specified abnormal findings of blood chemistry: Secondary | ICD-10-CM

## 2022-05-07 LAB — CBC AND DIFFERENTIAL
Absolute NRBC: 0 10*3/uL (ref 0.00–0.00)
Basophils Absolute Automated: 0.03 10*3/uL (ref 0.00–0.08)
Basophils Automated: 0.7 %
Eosinophils Absolute Automated: 0.05 10*3/uL (ref 0.00–0.44)
Eosinophils Automated: 1.1 %
Hematocrit: 36 % (ref 34.7–43.7)
Hgb: 11.8 g/dL (ref 11.4–14.8)
Immature Granulocytes Absolute: 0.01 10*3/uL (ref 0.00–0.07)
Immature Granulocytes: 0.2 %
Instrument Absolute Neutrophil Count: 2.07 10*3/uL (ref 1.10–6.33)
Lymphocytes Absolute Automated: 1.93 10*3/uL (ref 0.42–3.22)
Lymphocytes Automated: 44 %
MCH: 31.5 pg (ref 25.1–33.5)
MCHC: 32.8 g/dL (ref 31.5–35.8)
MCV: 96 fL (ref 78.0–96.0)
MPV: 10 fL (ref 8.9–12.5)
Monocytes Absolute Automated: 0.3 10*3/uL (ref 0.21–0.85)
Monocytes: 6.8 %
Neutrophils Absolute: 2.07 10*3/uL (ref 1.10–6.33)
Neutrophils: 47.2 %
Nucleated RBC: 0 /100 WBC (ref 0.0–0.0)
Platelets: 333 10*3/uL (ref 142–346)
RBC: 3.75 10*6/uL — ABNORMAL LOW (ref 3.90–5.10)
RDW: 12 % (ref 11–15)
WBC: 4.39 10*3/uL (ref 3.10–9.50)

## 2022-05-07 LAB — COMPREHENSIVE METABOLIC PANEL
ALT: 9 U/L (ref 0–55)
AST (SGOT): 14 U/L (ref 5–41)
Albumin/Globulin Ratio: 1.1 (ref 0.9–2.2)
Albumin: 3.9 g/dL (ref 3.5–5.0)
Alkaline Phosphatase: 48 U/L (ref 37–117)
Anion Gap: 7 (ref 5.0–15.0)
BUN: 12 mg/dL (ref 7.0–21.0)
Bilirubin, Total: 0.4 mg/dL (ref 0.2–1.2)
CO2: 30 mEq/L — ABNORMAL HIGH (ref 17–29)
Calcium: 9.6 mg/dL (ref 8.5–10.5)
Chloride: 103 mEq/L (ref 99–111)
Creatinine: 0.8 mg/dL (ref 0.4–1.0)
Globulin: 3.4 g/dL (ref 2.0–3.6)
Glucose: 96 mg/dL (ref 70–100)
Potassium: 4 mEq/L (ref 3.5–5.3)
Protein, Total: 7.3 g/dL (ref 6.0–8.3)
Sodium: 140 mEq/L (ref 135–145)
eGFR: >60 mL/min/{1.73_m2} (ref >=60)

## 2022-05-07 LAB — POCT URINALYSIS DIPSTIX (10)(MULTI-TEST)
Bilirubin, UA POCT: NEGATIVE
Blood, UA POCT: NEGATIVE
Glucose, UA POCT: 100 mg/dL — AB
Ketones, UA POCT: NEGATIVE mg/dL
Nitrite, UA POCT: NEGATIVE
POCT Leukocytes, UA: NEGATIVE
POCT Spec Gravity, UA: 1.01 (ref 1.001–1.035)
POCT pH, UA: 7 (ref 5–8)
Protein, UA POCT: NEGATIVE mg/dL
Urobilinogen, UA: 0.2 mg/dL

## 2022-05-07 LAB — T4, FREE: T4 Free: 1.03 ng/dL (ref 0.69–1.48)

## 2022-05-07 LAB — HEMOLYSIS INDEX(SOFT): Hemolysis Index: 4 Index (ref 0–24)

## 2022-05-07 LAB — TSH: TSH: 0.61 u[IU]/mL (ref 0.35–4.94)

## 2022-05-07 LAB — T3, FREE: T3, Free: 2.59 pg/mL (ref 1.71–3.71)

## 2022-05-07 NOTE — Progress Notes (Signed)
Subjective:      Date: 05/07/2022 8:53 AM   Patient ID: Tammy Larson is a 51 y.o. female.    Chief Complaint:  Chief Complaint   Patient presents with    Abdominal Pain       HPI:  HPI Tammy Larson is a 51 y.o. female -  who  presents with 5-6 days of  I had a really bad pain above my navel and down to my lower right side of my stomach. The lower right side was tender to touch. Pain has improved but she has had increased bloating and flatus.   Pertinent history -   she has noted GI discomfort with eating gluten- although not to this degree, and prior to onset of symptoms, she had increased gluten intake.  She has passing increased flatus.  Bowel movements have been regular- stool ? Lighter.  She has had no change in appetite. No nausea nor vomiting.  No fever nor chills.    s/p a hysterectomy 6 years prior - she still has her ovaries    No problems updated.      Problem List:  Patient Active Problem List   Diagnosis    Uterine leiomyoma    Primary hypertension    Perimenopausal vasomotor symptoms       Current Medications:  Current Outpatient Medications   Medication Sig Dispense Refill    B Complex Vitamins (VITAMIN B COMPLEX) Tab Take 1 tablet by mouth daily      Biotin w/ Vitamins C & E (HAIR/SKIN/NAILS PO) Take 1 tablet by mouth daily      CALCIUM-MAGNESIUM-ZINC PO Take 1 tablet by mouth daily      Cholecalciferol (D3 PO) Take 5,000 IU by mouth daily      ciclopirox (Penlac) 8 % solution Apply topically nightly 6 mL 0    estradiol (CLIMARA) 0.05 MG/24HR APPLY 1 PATCH ONCE A WEEK 4 patch 5    fluticasone (FLONASE) 50 MCG/ACT nasal spray SPRAY 1 SPRAY BY NASAL ROUTE EVERY DAY (Patient taking differently: As needed) 16 mL 1    Jublia 10 % Solution Apply topically daily To affected area      Omega-3 Fatty Acids (Omega-3 Fish Oil) 500 MG Cap Take 2 capsules (1,000 mg) by mouth daily      Probiotic Product (PROBIOTIC PO) Take 1 tablet by mouth daily      valsartan-hydroCHLOROthiazide (DIOVAN-HCT) 160-25 MG per  tablet TAKE 1 TABLET BY MOUTH EVERY DAY 90 tablet 1     No current facility-administered medications for this visit.       Allergies:  No Known Allergies       The following sections were reviewed this encounter by the provider:        ROS:  Review of Systems   Constitutional:  Negative for chills and fever.   Gastrointestinal:  Positive for abdominal pain (as per hpi). Negative for diarrhea, nausea and vomiting.   Genitourinary:  Positive for frequency.        Objective:     Vitals:  BP 130/87 (BP Site: Left arm, Patient Position: Sitting, Cuff Size: Medium)   Pulse (!) 58   Temp 98 F (36.7 C) (Oral)   Resp 17   Wt 76.6 kg (168 lb 12.8 oz)   LMP 12/11/2013   SpO2 98%   BMI 27.66 kg/m     Physical Exam:  Physical Exam  Vitals and nursing note reviewed.   Constitutional:  General: She is not in acute distress.     Appearance: Normal appearance. She is not ill-appearing.   Pulmonary:      Effort: Pulmonary effort is normal. No respiratory distress.      Breath sounds: Normal breath sounds.   Abdominal:      General: There is no distension.      Palpations: Abdomen is soft.      Tenderness: There is abdominal tenderness (right lower quadrant tenderness to deep palpation). There is no right CVA tenderness, left CVA tenderness, guarding or rebound.   Genitourinary:     Vagina: Normal. No foreign body. No tenderness.      Uterus: Absent.    Neurological:      Mental Status: She is alert and oriented to person, place, and time.   Psychiatric:         Mood and Affect: Mood normal.         Behavior: Behavior normal.     Assessment/Plan:       1. Acute right lower quadrant pain  - CT Abdomen Pelvis W IV And PO Cont; Future  - POCT leukocytes, urine, qualitative, dipstick    Encouraged she avoid/decrease gluten in her diet.  Encouraged:  - walking:   -drinking peppermint or ginger tea to soothe the GI tract and decrease unsettled stomach.  -Increased water and eating small frequent meals, instead of large ones.    - Avoid gluten rich foods in addition to foods that might be spicy or fatty   - probiotics to stimulate the immune system while strengthening the lining of the gut    Appointment for CT study  scheduled for Thursday the 19th at 1:30 pm.  Low likelihood for acute surgical presentation, based on improved symptoms and patient not having any other systemic signs however patient strongly advised should symptoms worsen to proceed immediately to the nearest ER   Patient to review pre-procedure recommendations by calling the department at [phone].          Heath Gold, MD

## 2022-05-07 NOTE — Progress Notes (Signed)
Blood draw:  Using aseptic technique, blood was drawn from the left antecubital without any complication. Attempt # 1. Patient tolerated procedure well. All labs will be sent to [facility] refrigerated.    -2 SSTs      -1 Lav    Patient tolerated procedure well and left in good condition today.

## 2022-05-08 ENCOUNTER — Ambulatory Visit: Payer: Commercial Managed Care - POS | Admitting: Family Medicine

## 2022-05-12 ENCOUNTER — Encounter: Payer: Self-pay | Admitting: Family Medicine

## 2022-05-12 ENCOUNTER — Other Ambulatory Visit: Payer: Self-pay | Admitting: Family Medicine

## 2022-05-15 ENCOUNTER — Ambulatory Visit: Payer: Commercial Managed Care - POS

## 2022-05-27 ENCOUNTER — Telehealth: Payer: Self-pay | Admitting: Family Medicine

## 2022-05-27 NOTE — Telephone Encounter (Signed)
Received a call from [facility] from [place] office of Dr. Reather Laurence [facility] [phone] requested recent lab result to be faxed to [phone].  Faxed lab result 05/07/22.

## 2022-07-24 ENCOUNTER — Other Ambulatory Visit: Payer: Self-pay | Admitting: Family Medicine

## 2022-07-29 ENCOUNTER — Other Ambulatory Visit: Payer: BC Managed Care – PPO

## 2022-08-04 ENCOUNTER — Encounter: Payer: Self-pay | Admitting: Family Medicine

## 2022-08-19 ENCOUNTER — Other Ambulatory Visit: Payer: Self-pay | Admitting: Family Medicine

## 2022-08-19 DIAGNOSIS — N951 Menopausal and female climacteric states: Secondary | ICD-10-CM

## 2022-08-25 ENCOUNTER — Encounter: Payer: Self-pay | Admitting: Family Medicine

## 2022-08-26 NOTE — Telephone Encounter (Signed)
I would like to get her in and take a closer look at this area.

## 2022-08-27 ENCOUNTER — Other Ambulatory Visit: Payer: BC Managed Care – PPO

## 2022-08-29 ENCOUNTER — Other Ambulatory Visit: Payer: Self-pay | Admitting: Family Medicine

## 2022-08-29 DIAGNOSIS — Z0000 Encounter for general adult medical examination without abnormal findings: Secondary | ICD-10-CM

## 2022-08-29 DIAGNOSIS — R7989 Other specified abnormal findings of blood chemistry: Secondary | ICD-10-CM

## 2022-08-29 DIAGNOSIS — E569 Vitamin deficiency, unspecified: Secondary | ICD-10-CM

## 2022-08-29 DIAGNOSIS — I10 Essential (primary) hypertension: Secondary | ICD-10-CM

## 2022-09-01 ENCOUNTER — Ambulatory Visit (FREE_STANDING_LABORATORY_FACILITY): Payer: BC Managed Care – PPO | Admitting: Family Medicine

## 2022-09-01 ENCOUNTER — Encounter: Payer: Self-pay | Admitting: Family Medicine

## 2022-09-01 VITALS — BP 109/74 | HR 66 | Temp 98.3°F | Resp 14 | Ht 65.5 in | Wt 168.2 lb

## 2022-09-01 DIAGNOSIS — R7989 Other specified abnormal findings of blood chemistry: Secondary | ICD-10-CM

## 2022-09-01 DIAGNOSIS — Z0000 Encounter for general adult medical examination without abnormal findings: Secondary | ICD-10-CM

## 2022-09-01 DIAGNOSIS — Z1231 Encounter for screening mammogram for malignant neoplasm of breast: Secondary | ICD-10-CM

## 2022-09-01 DIAGNOSIS — N951 Menopausal and female climacteric states: Secondary | ICD-10-CM

## 2022-09-01 DIAGNOSIS — I10 Essential (primary) hypertension: Secondary | ICD-10-CM

## 2022-09-01 LAB — LIPID PANEL
Cholesterol / HDL Ratio: 4.4 Index
Cholesterol: 198 mg/dL (ref 0–199)
HDL: 45 mg/dL (ref 40–9999)
LDL Calculated: 138 mg/dL — ABNORMAL HIGH (ref 0–99)
Triglycerides: 73 mg/dL (ref 34–149)
VLDL Calculated: 15 mg/dL (ref 10–40)

## 2022-09-01 LAB — CBC AND DIFFERENTIAL
Absolute NRBC: 0 10*3/uL (ref 0.00–0.00)
Basophils Absolute Automated: 0.03 10*3/uL (ref 0.00–0.08)
Basophils Automated: 0.5 %
Eosinophils Absolute Automated: 0.05 10*3/uL (ref 0.00–0.44)
Eosinophils Automated: 0.9 %
Hematocrit: 36.9 % (ref 34.7–43.7)
Hgb: 12.7 g/dL (ref 11.4–14.8)
Immature Granulocytes Absolute: 0.01 10*3/uL (ref 0.00–0.07)
Immature Granulocytes: 0.2 %
Instrument Absolute Neutrophil Count: 3.68 10*3/uL (ref 1.10–6.33)
Lymphocytes Absolute Automated: 1.6 10*3/uL (ref 0.42–3.22)
Lymphocytes Automated: 28.2 %
MCH: 32.2 pg (ref 25.1–33.5)
MCHC: 34.4 g/dL (ref 31.5–35.8)
MCV: 93.7 fL (ref 78.0–96.0)
MPV: 9.9 fL (ref 8.9–12.5)
Monocytes Absolute Automated: 0.3 10*3/uL (ref 0.21–0.85)
Monocytes: 5.3 %
Neutrophils Absolute: 3.68 10*3/uL (ref 1.10–6.33)
Neutrophils: 64.9 %
Nucleated RBC: 0 /100 WBC (ref 0.0–0.0)
Platelets: 294 10*3/uL (ref 142–346)
RBC: 3.94 10*6/uL (ref 3.90–5.10)
RDW: 12 % (ref 11–15)
WBC: 5.67 10*3/uL (ref 3.10–9.50)

## 2022-09-01 LAB — ECG 12-LEAD
Atrial Rate: 72 {beats}/min
IHS MUSE NARRATIVE AND IMPRESSION: NORMAL
P Axis: 69 degrees
P-R Interval: 150 ms
Q-T Interval: 380 ms
QRS Duration: 74 ms
QTC Calculation (Bezet): 416 ms
R Axis: 73 degrees
T Axis: 30 degrees
Ventricular Rate: 72 {beats}/min

## 2022-09-01 LAB — HEMOLYSIS INDEX(SOFT): Hemolysis Index: 5 Index (ref 0–24)

## 2022-09-01 LAB — T3, FREE: T3, Free: 3 pg/mL (ref 1.71–3.71)

## 2022-09-01 LAB — COMPREHENSIVE METABOLIC PANEL
ALT: 13 U/L (ref 0–55)
AST (SGOT): 17 U/L (ref 5–41)
Albumin/Globulin Ratio: 1.2 (ref 0.9–2.2)
Albumin: 4.1 g/dL (ref 3.5–5.0)
Alkaline Phosphatase: 49 U/L (ref 37–117)
Anion Gap: 6 (ref 5.0–15.0)
BUN: 9 mg/dL (ref 7.0–21.0)
Bilirubin, Total: 0.6 mg/dL (ref 0.2–1.2)
CO2: 30 mEq/L — ABNORMAL HIGH (ref 17–29)
Calcium: 9.1 mg/dL (ref 8.5–10.5)
Chloride: 103 mEq/L (ref 99–111)
Creatinine: 0.8 mg/dL (ref 0.4–1.0)
Globulin: 3.3 g/dL (ref 2.0–3.6)
Glucose: 85 mg/dL (ref 70–100)
Potassium: 3.6 mEq/L (ref 3.5–5.3)
Protein, Total: 7.4 g/dL (ref 6.0–8.3)
Sodium: 139 mEq/L (ref 135–145)
eGFR: >60 mL/min/{1.73_m2} (ref >=60)

## 2022-09-01 LAB — T4, FREE: T4 Free: 0.92 ng/dL (ref 0.69–1.48)

## 2022-09-01 LAB — TSH: TSH: 0.7 u[IU]/mL (ref 0.35–4.94)

## 2022-09-01 MED ORDER — ESTRADIOL 0.05 MG/24HR TD PTWK
MEDICATED_PATCH | TRANSDERMAL | 1 refills | Status: DC
Start: 2022-09-01 — End: 2022-09-19

## 2022-09-01 MED ORDER — VALSARTAN-HYDROCHLOROTHIAZIDE 80-12.5 MG PO TABS
1.0000 | ORAL_TABLET | Freq: Every day | ORAL | 0 refills | Status: DC
Start: 2022-09-01 — End: 2022-12-01

## 2022-09-01 NOTE — Progress Notes (Signed)
IN OFFICE TESTING-  Results of which discussed with patient  Audiogram:WNL  Vision: Completed glasses and regular eye exams     Results of [facility] Evaluation   Visceral Fat : 16-->15.   BMI: 28-->27.6      HEALTH CARE MAINTENANCE SCREENING  Menstrual cycles: Hysterectomy  Last Menstrual Period: N/A  Last  Pap smear : Does not have a GYN Any abnormal studies? No   History of STDs?No  Last Mammogram? February 2023  Any abnormal studies? No    Last DEXA scan? None  Last Colonoscopy:   2020 Repeat in 10 Years  Any abnormal studies? No  COVID Vaccines completed: No  Pneumonia series completed? No  Tetanus completed: January 2023  INfluenza completedNo-     Blood draw:  Using aseptic technique, blood was drawn from the left antecubital without any complication. Attempt # 1. Patient tolerated procedure well. All labs will be sent to [facility].    -3 SSTs  ( 1 SST with serum separated into a 1 plastic tube labeled serum)     -2 Lav    [facility] completed and sent with patient.    Patient tolerated procedure well and left in good condition today.

## 2022-09-01 NOTE — Progress Notes (Signed)
Subjective:      Date: 09/01/2022 12:42 PM   Patient ID: Tammy Larson is a 52 y.o. female.    Chief Complaint:  Chief Complaint   Patient presents with    Annual Exam       HPI  Patient presents for a Comprehensive Health Examination.     SPECIFIC CONCERNS:No acute complaints or concerns at this time.    MEDICAL HISTORY:    History of Fibroids  - S/P a Myomectomy in 2015 and Total Hysterectomy - in 2017 - Ovaries not removed. Anemia resolved     Increased hot flashes and flushes   - for approximately 6  years.      Currently on transdermal estrogen - and  hot flushes notably the ones at night, disruptive of her sleep, have resolved    Hypertension  - Home BP, usually in the 109/70's- max SBP 120  - taking medication consistently - no adverse symptoms associated   - on Diovan-HCT-      Gastritis  Per Endoscopy on 05/28/2022  -Gastric symptoms have decreased with elimating breads  and with dietary      Patient Active Problem List   Diagnosis    Uterine leiomyoma    Primary hypertension    Perimenopausal vasomotor symptoms     DIET: She has decreased breads. Increased vegetables and fruits. She generally has chicken, eats salmon frequently   Fluids: At least 64 oz per day.   Coffee: 1-2 cups of coffee per day .  She has not imbibed alcohol for January - she is optimistic and will consider increasing the frequency of her 'dry months'     EXERCISE: 3-4x/week and does a Careers information officer camp program 3 days per week and 1 day devoted to the treadmill     SLEEP: It has been amazing!  Gets at least 7 hours of sleep  STRESS: In general -  a little bit- with the management of of herparents and their medical problems     SOCIAL HISTORY:    Employer: [employer]  Married  Children: 1 child     IN OFFICE TESTING-  Results of which discussed with patient  Audiogram:WNL  Vision: Completed glasses and regular eye exams - failed near acuity      Results of iNBody Evaluation   Visceral Fat : 16-->15.   BMI: 28-->27.6        HEALTH  CARE MAINTENANCE SCREENING  Menstrual cycles: Hysterectomy  secondary to fibroids  Last Menstrual Period: N/A  Last  Pap smear :Any abnormal studies? No   History of STDs?No  Last Mammogram? February 2023     Any abnormal studies? No     Last DEXA scan? None  Last Colonoscopy:   2020 Repeat in 10 Years  Any abnormal studies? No  COVID Vaccines completed: No  Pneumonia series completed? No  Tetanus completed: January 2023  INfluenza completedNo-           Current Medications:  Current Outpatient Medications   Medication Sig Dispense Refill    B Complex Vitamins (VITAMIN B COMPLEX) Tab Take 1 tablet by mouth daily      Biotin w/ Vitamins C & E (HAIR/SKIN/NAILS PO) Take 1 tablet by mouth daily      CALCIUM-MAGNESIUM-ZINC PO Take 1 tablet by mouth daily      Cholecalciferol (D3 PO) Take 5,000 IU by mouth daily      fluticasone (FLONASE) 50 MCG/ACT nasal spray 1 SPRAY BY NASAL ROUTE  AS NEEDED FOR ALLERGIES AS NEEDED 16 mL 1    Jublia 10 % Solution Apply topically daily To affected area      Omega-3 Fatty Acids (Omega-3 Fish Oil) 500 MG Cap Take 2 capsules (1,000 mg) by mouth daily      Probiotic Product (PROBIOTIC PO) Take 1 tablet by mouth daily      estradiol (CLIMARA) 0.05 MG/24HR APPLY 1 PATCH ONCE A WEEK 12 patch 1    Multiple Vitamin (MULTIVITAMIN ADULT PO) Take by mouth      valsartan-hydroCHLOROthiazide (DIOVAN-HCT) 80-12.5 MG per tablet Take 1 tablet by mouth daily 90 tablet 0     No current facility-administered medications for this visit.       Allergies:  No Known Allergies    Past Medical History:  Past Medical History:   Diagnosis Date    Abnormal vision     eyeglasses    Anemia     Hypertension     Post-operative nausea and vomiting     apfel=3       Past Surgical History:  Past Surgical History:   Procedure Laterality Date    HYSTERECTOMY  2017    HYSTEROSCOPY, MYOSURE MORCELLATION N/A 12/23/2013    Procedure: Hysteroscopic Myomectomy with Myosure;  Surgeon: Jackquline Denmark, MD;  Location: [facility];   Service: Gynecology;  Laterality: N/A;    LAPAROSCOPY, DIAGNOSTIC           Social History:  Social History     Socioeconomic History    Marital status: Married   Tobacco Use    Smoking status: Never    Smokeless tobacco: Never   Vaping Use    Vaping Use: Never used   Substance and Sexual Activity    Alcohol use: Yes     Comment: socially/varies    Drug use: No     Social Determinants of Health     Financial Resource Strain: Low Risk  (08/29/2022)    Overall Financial Resource Strain (CARDIA)     Difficulty of Paying Living Expenses: Not hard at all   Food Insecurity: No Food Insecurity (08/29/2022)    Hunger Vital Sign     Worried About Running Out of Food in the Last Year: Never true     Ran Out of Food in the Last Year: Never true   Transportation Needs: No Transportation Needs (08/29/2022)    PRAPARE - Armed forces logistics/support/administrative officer (Medical): No     Lack of Transportation (Non-Medical): No   Physical Activity: Sufficiently Active (08/29/2022)    Exercise Vital Sign     Days of Exercise per Week: 4 days     Minutes of Exercise per Session: 60 min   Stress: Stress Concern Present (08/29/2022)    [place]     Feeling of Stress : To some extent   Social Connections: Moderately Integrated (08/29/2022)    Social Connection and Isolation Panel [NHANES]     Frequency of Communication with Friends and Family: More than three times a week     Frequency of Social Gatherings with Friends and Family: Once a week     Attends Religious Services: 1 to 4 times per year     Active Member of [employer] or Organizations: No     Attends Archivist Meetings: Never     Marital Status: Married   Human resources officer Violence: Not At Risk (08/29/2022)    Humiliation,  Afraid, Rape, and Kick questionnaire     Fear of Current or Ex-Partner: No     Emotionally Abused: No     Physically Abused: No     Sexually Abused: No   Housing Stability: Unknown (08/29/2022)    Housing  Stability Vital Sign     Unable to Pay for Housing in the Last Year: No     Unstable Housing in the Last Year: No        The following sections were reviewed this encounter by the provider:        Review of Systems   Constitutional:  Negative for activity change, appetite change, chills, fatigue, fever and unexpected weight change.   HENT:  Negative for postnasal drip, sinus pressure, sore throat and voice change.    Eyes:  Negative for photophobia and visual disturbance.   Respiratory:  Negative for chest tightness and shortness of breath.    Cardiovascular:  Negative for chest pain and palpitations.   Gastrointestinal:  Negative for abdominal distention, abdominal pain, constipation and diarrhea.   Endocrine: Negative for cold intolerance and heat intolerance.   Genitourinary:  Negative for difficulty urinating, dyspareunia, dysuria, menstrual problem, pelvic pain, vaginal bleeding and vaginal discharge.   Musculoskeletal:  Negative for arthralgias.   Skin:  Negative for color change and rash.   Neurological:  Negative for dizziness, weakness, light-headedness and headaches.   Hematological:  Negative for adenopathy.   Psychiatric/Behavioral:  Negative for agitation, confusion, decreased concentration and sleep disturbance (Significantly improved with topical estrogen).        Objective:     Vitals:  BP 109/74 (BP Site: Left arm, Patient Position: Sitting, Cuff Size: Medium)   Pulse 66   Temp 98.3 F (36.8 C) (Oral)   Resp 14   Ht 1.664 m (5' 5.5")   Wt 76.3 kg (168 lb 3.2 oz)   LMP 12/11/2013   SpO2 99%   BMI 27.56 kg/m     Physical Exam  Vitals and nursing note reviewed.   Constitutional:       General: She is not in acute distress.     Appearance: Normal appearance. She is well-developed.   HENT:      Head: Normocephalic and atraumatic.      Right Ear: Tympanic membrane, ear canal and external ear normal.      Left Ear: Tympanic membrane, ear canal and external ear normal.      Mouth/Throat:       Mouth: Mucous membranes are moist.      Pharynx: No oropharyngeal exudate or posterior oropharyngeal erythema.   Eyes:      Pupils: Pupils are equal, round, and reactive to light.   Neck:      Thyroid: No thyromegaly.   Cardiovascular:      Rate and Rhythm: Normal rate and regular rhythm.      Heart sounds: Normal heart sounds.   Pulmonary:      Effort: Pulmonary effort is normal. No respiratory distress.      Breath sounds: Normal breath sounds.   Chest:   Breasts:     Right: No nipple discharge or skin change.      Left: No nipple discharge or skin change.      Comments: No fixed masses  Abdominal:      General: There is no distension.      Palpations: Abdomen is soft.      Tenderness: There is no abdominal tenderness.   Genitourinary:  General: Normal vulva.      Vagina: Normal.      Comments: Cervix/uterus surgically absent. ovaries not palpated  Musculoskeletal:         General: No deformity. Normal range of motion.      Cervical back: Normal range of motion and neck supple.   Lymphadenopathy:      Upper Body:      Right upper body: No axillary adenopathy.      Left upper body: No axillary adenopathy.   Skin:     General: Skin is warm and dry.   Neurological:      General: No focal deficit present.      Mental Status: She is alert and oriented to person, place, and time.      Cranial Nerves: No cranial nerve deficit.      Sensory: No sensory deficit.   Psychiatric:         Mood and Affect: Mood normal.         Behavior: Behavior normal.         Thought Content: Thought content normal.       Assessment/Plan:       1. Annual physical exam  - ECG 12 lead    2. Primary hypertension  - Comprehensive metabolic panel  - valsartan-hydroCHLOROthiazide (DIOVAN-HCT) 80-12.5 MG per tablet; Take 1 tablet by mouth daily  Dispense: 90 tablet; Refill: 0  Chronic/controlled  Will do a trial on lowered dose- Patient will follow up in 3 months to re-evaluate on Diovan HCTZ lowered to 80-12.5 MG per tablet  Recommend  she  continue optimizing therapeutic lifestyle changes which include obtaining at least 150 minutes of aerobic exercise per week and eating a heart healthy diet ( i.e. DASH diet - www.heart.org or https://wilson-eaton.com/ ). Recommend checking ambulatory blood pressures once or twice a day and document in a log for review    3. Perimenopausal vasomotor symptoms  - estradiol (CLIMARA) 0.05 MG/24HR; APPLY 1 PATCH ONCE A WEEK  Dispense: 12 patch; Refill: 1  Vasomotor symptoms are well-controlled on medication  Did review potential risks associated with medication to include thromboembolic events, patient to do a trial off hormonal medication in the next 4-6 months  Continue to decrease or limit alcohol use to help with vasomotor symptoms    4. Abnormal thyroid blood test  - T4, free  - T3, free  - TSH    5. Laboratory tests ordered as part of a complete physical exam (CPE)  - TSH  - CBC and differential  - Lipid panel  - Comprehensive metabolic panel    6. Screening mammogram, encounter for  - Mammo Digital 2D Screening Bilateral W CAD; Future      Health Maintenance:  High BMI follow-up: BMI Follow Up Care Plan Documented.   Contact information for follow-up appointment with [facility] trainer to discuss diet and nutrition in more depth, and devise an  individualized management plan  Encouraged she continue to decrease or hold alcohol use - limiting to less than 1 glass per day   Recommend optimizing low carbohydrate diet efforts and obtaining at least 150 minutes of aerobic exercise per week.   Recommend 20-25 grams of dietary fiber daily.   Recommend drinking at least 60-80 ounces of water per day.   Recommend optimizing low sodium diet measures ( less than 2 grams of sodium in the diet per day ).   Recommend obtaining a Shingles vaccination-patient declined at this visit.  DECLINED COVID, And  FLU vaccines at this visit    Return in about 3 months (around 11/30/2022) for HTN recheck.    Wilnette Kales, MD

## 2022-09-03 ENCOUNTER — Encounter: Payer: Self-pay | Admitting: Family Medicine

## 2022-09-04 ENCOUNTER — Encounter: Payer: Self-pay | Admitting: Pharmacist

## 2022-09-08 ENCOUNTER — Ambulatory Visit: Payer: BC Managed Care – PPO | Admitting: Family Medicine

## 2022-09-08 ENCOUNTER — Encounter: Payer: Self-pay | Admitting: Family Medicine

## 2022-09-08 VITALS — BP 147/83 | HR 82 | Ht 60.0 in | Wt 165.4 lb

## 2022-09-08 DIAGNOSIS — E11319 Type 2 diabetes mellitus with unspecified diabetic retinopathy without macular edema: Secondary | ICD-10-CM | POA: Diagnosis not present

## 2022-09-08 DIAGNOSIS — I10 Essential (primary) hypertension: Secondary | ICD-10-CM | POA: Diagnosis not present

## 2022-09-08 DIAGNOSIS — F17210 Nicotine dependence, cigarettes, uncomplicated: Secondary | ICD-10-CM

## 2022-09-08 DIAGNOSIS — Z794 Long term (current) use of insulin: Secondary | ICD-10-CM

## 2022-09-08 DIAGNOSIS — R252 Cramp and spasm: Secondary | ICD-10-CM

## 2022-09-08 DIAGNOSIS — E785 Hyperlipidemia, unspecified: Secondary | ICD-10-CM

## 2022-09-08 MED ORDER — FLUTICASONE FUROATE-VILANTEROL 100-25 MCG/ACT IN AEPB: 1.0000 | INHALATION_SPRAY | Freq: Every day | RESPIRATORY_TRACT | 11 refills | Status: AC

## 2022-09-08 MED ORDER — SEMAGLUTIDE (2 MG/DOSE) 8 MG/3ML ~~LOC~~ SOPN: 2.0000 mg | PEN_INJECTOR | SUBCUTANEOUS | 1 refills | Status: DC

## 2022-09-08 NOTE — Assessment & Plan Note (Signed)
Counseled on smoking cessation.  She is interested in lung cancer screening.  Orders entered.

## 2022-09-08 NOTE — Progress Notes (Signed)
[name] Hurley - 52 y.o. female MRN [id number]  Date of birth: Oct 26, 1970  Subjective No chief complaint on file.   HPI [name] Hurley is a 52 y.o. here today for follow up visit.   She reports that she is doing "ok".  Feels tired throughout the day.    Continues on combination of amlodipine, losartan, hctz for management of HTN.  She is tolerating medication well at current strength.  She has not had chest pain, shortness of breath, palpitations, headache or vision changes.    Remains on Tresiba with Ozempic for management of diabetes.  She is tolerating these well and denies symptoms of hypoglycemia.  Weight is up some since last visit.  She remains fairly sedentary.   Continues to smoke.  She is using breo when she has cold and during allergy season.   She does continue to smoke regularly.  Has had difficulty cutting back and quitting.   She would like to have lung cancer screening.   ROS:  A comprehensive ROS was completed and negative except as noted per HPI   Past Medical History:  Diagnosis Date   Diabetes mellitus    Dyspareunia    Elevated serum creatinine    Hypertension    Sciatica     Past Surgical History:  Procedure Laterality Date   CESAREAN SECTION     paragard     removal 7-16 & reinsertion 02-13-15    Social History   Socioeconomic History   Marital status: Married    Spouse name: Not on file   Number of children: 4   Years of education: 14   Highest education level: Not on file  Occupational History   Occupation: Nutritition   Tobacco Use   Smoking status: Every Day    Packs/day: 1.00    Years: 22.00    Total pack years: 22.00    Types: Cigarettes   Smokeless tobacco: Never  Substance and Sexual Activity   Alcohol use: Not Currently   Drug use: No   Sexual activity: Yes    Partners: Male    Birth control/protection: I.U.D.  Other Topics Concern   Not on file  Social History Narrative   Born and raised in [place]. Fun: Sleep   Denies any  religious beliefs effecting health care.    Social Determinants of Health   Financial Resource Strain: Not on file  Food Insecurity: Not on file  Transportation Needs: Not on file  Physical Activity: Not on file  Stress: Not on file  Social Connections: Not on file    Family History  Problem Relation Age of Onset   Hypertension Father    COPD Father    Emphysema Father    Diabetes Maternal Aunt    Hypertension Mother    Hyperlipidemia Mother    Colon cancer Mother    COPD Paternal Grandfather    Hypertension Brother    Heart disease Neg Hx     Health Maintenance  Topic Date Due   Lung Cancer Screening  Never done   Diabetic kidney evaluation - eGFR measurement  05/13/2022   COVID-19 Vaccine (4 - 2023-24 season) 09/24/2022 (Originally 03/28/2022)   MAMMOGRAM  10/22/2022 (Originally 10/12/2021)   HIV Screening  10/22/2022 (Originally 01/05/1984)   OPHTHALMOLOGY EXAM  10/23/2022   HEMOGLOBIN A1C  11/05/2022   Diabetic kidney evaluation - Urine ACR  02/04/2023   Fecal DNA (Cologuard)  03/08/2023   PAP SMEAR-Modifier  04/12/2023   FOOT EXAM  09/09/2023   DTaP/Tdap/Td (  2 - Td or Tdap) 02/19/2030   INFLUENZA VACCINE  Completed   Hepatitis C Screening  Completed   Zoster Vaccines- Shingrix  Completed   HPV VACCINES  Aged Out     ----------------------------------------------------------------------------------------------------------------------------------------------------------------------------------------------------------------- Physical Exam BP (!) 147/83   Pulse 82   Ht 5' (1.524 m)   Wt 165 lb 6.4 oz (75 kg)   SpO2 100%   BMI 32.30 kg/m   Physical Exam Constitutional:      Appearance: Normal appearance.  HENT:     Head: Normocephalic and atraumatic.  Eyes:     General: No scleral icterus. Cardiovascular:     Rate and Rhythm: Normal rate and regular rhythm.  Musculoskeletal:     Cervical back: Neck supple.  Neurological:     Mental Status: She is  alert.  Psychiatric:        Mood and Affect: Mood normal.        Behavior: Behavior normal.     ------------------------------------------------------------------------------------------------------------------------------------------------------------------------------------------------------------------- Assessment and Plan  Essential hypertension Blood pressure is elevated today.  Recommend monitoring blood pressure at home.  Low-sodium diet encouraged.  Cutting back on smoking encouraged as well.  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  Updating A1c today.  Continue current medications.  Dyslipidemia Tolerating pravastatin well at current strength.  Recommend continuation.  Cigarette nicotine dependence without complication Counseled on smoking cessation.  She is interested in lung cancer screening.  Orders entered.   Meds ordered this encounter  Medications   fluticasone furoate-vilanterol (BREO ELLIPTA) 100-25 MCG/ACT AEPB    Sig: Inhale 1 puff into the lungs daily.    Dispense:  1 each    Refill:  11   Semaglutide, 2 MG/DOSE, 8 MG/3ML SOPN    Sig: Inject 2 mg as directed once a week.    Dispense:  9 mL    Refill:  1    No follow-ups on file.    This visit occurred during the SARS-CoV-2 public health emergency.  Safety protocols were in place, including screening questions prior to the visit, additional usage of staff PPE, and extensive cleaning of exam room while observing appropriate contact time as indicated for disinfecting solutions.

## 2022-09-08 NOTE — Assessment & Plan Note (Signed)
Tolerating pravastatin well at current strength.  Recommend continuation.

## 2022-09-08 NOTE — Assessment & Plan Note (Signed)
Blood sugars have been better controlled.  Updating A1c today.  Continue current medications.

## 2022-09-08 NOTE — Assessment & Plan Note (Signed)
Blood pressure is elevated today.  Recommend monitoring blood pressure at home.  Low-sodium diet encouraged.  Cutting back on smoking encouraged as well.

## 2022-09-09 LAB — COMPLETE METABOLIC PANEL WITH GFR
AG Ratio: 1.5 (calc) (ref 1.0–2.5)
ALT: 6 U/L (ref 6–29)
AST: 12 U/L (ref 10–35)
Albumin: 4.3 g/dL (ref 3.6–5.1)
Alkaline phosphatase (APISO): 74 U/L (ref 37–153)
BUN/Creatinine Ratio: 20 (calc) (ref 6–22)
BUN: 25 mg/dL (ref 7–25)
CO2: 25 mmol/L (ref 20–32)
Calcium: 9.7 mg/dL (ref 8.6–10.4)
Chloride: 108 mmol/L (ref 98–110)
Creat: 1.27 mg/dL — ABNORMAL HIGH (ref 0.50–1.03)
Globulin: 2.9 g/dL (calc) (ref 1.9–3.7)
Glucose, Bld: 76 mg/dL (ref 65–99)
Potassium: 4.8 mmol/L (ref 3.5–5.3)
Sodium: 142 mmol/L (ref 135–146)
Total Bilirubin: 0.2 mg/dL (ref 0.2–1.2)
Total Protein: 7.2 g/dL (ref 6.1–8.1)
eGFR: 51 mL/min/{1.73_m2} — ABNORMAL LOW (ref >=60)

## 2022-09-09 LAB — CBC WITH DIFFERENTIAL/PLATELET
Absolute Monocytes: 616 cells/uL (ref 200–950)
Basophils Absolute: 91 cells/uL (ref 0–200)
Basophils Relative: 0.8 %
Eosinophils Absolute: 935 cells/uL — ABNORMAL HIGH (ref 15–500)
Eosinophils Relative: 8.2 %
HCT: 38.9 % (ref 35.0–45.0)
Hemoglobin: 12.6 g/dL (ref 11.7–15.5)
Lymphs Abs: 3659 cells/uL (ref 850–3900)
MCH: 27.9 pg (ref 27.0–33.0)
MCHC: 32.4 g/dL (ref 32.0–36.0)
MCV: 86.1 fL (ref 80.0–100.0)
MPV: 10.3 fL (ref 7.5–12.5)
Monocytes Relative: 5.4 %
Neutro Abs: 6099 cells/uL (ref 1500–7800)
Neutrophils Relative %: 53.5 %
Platelets: 220 10*3/uL (ref 140–400)
RBC: 4.52 10*6/uL (ref 3.80–5.10)
RDW: 13.8 % (ref 11.0–15.0)
Total Lymphocyte: 32.1 %
WBC: 11.4 10*3/uL — ABNORMAL HIGH (ref 3.8–10.8)

## 2022-09-09 LAB — LIPID PANEL W/REFLEX DIRECT LDL
Cholesterol: 179 mg/dL (ref <200)
HDL: 35 mg/dL — ABNORMAL LOW (ref >=50)
LDL Cholesterol (Calc): 107 mg/dL (calc) — ABNORMAL HIGH
Non-HDL Cholesterol (Calc): 144 mg/dL (calc) — ABNORMAL HIGH (ref <130)
Total CHOL/HDL Ratio: 5.1 (calc) — ABNORMAL HIGH (ref <5.0)
Triglycerides: 258 mg/dL — ABNORMAL HIGH (ref <150)

## 2022-09-09 LAB — MAGNESIUM: Magnesium: 1.6 mg/dL (ref 1.5–2.5)

## 2022-09-09 LAB — IRON,TIBC AND FERRITIN PANEL
%SAT: 14 % (calc) — ABNORMAL LOW (ref 16–45)
Ferritin: 25 ng/mL (ref 16–232)
Iron: 48 ug/dL (ref 45–160)
TIBC: 344 mcg/dL (calc) (ref 250–450)

## 2022-09-09 LAB — HEMOGLOBIN A1C
Hgb A1c MFr Bld: 7 % of total Hgb — ABNORMAL HIGH (ref <5.7)
Mean Plasma Glucose: 154 mg/dL
eAG (mmol/L): 8.5 mmol/L

## 2022-09-17 ENCOUNTER — Ambulatory Visit (INDEPENDENT_AMBULATORY_CARE_PROVIDER_SITE_OTHER): Payer: BC Managed Care – PPO

## 2022-09-17 DIAGNOSIS — F17210 Nicotine dependence, cigarettes, uncomplicated: Secondary | ICD-10-CM

## 2022-09-18 ENCOUNTER — Other Ambulatory Visit: Payer: Self-pay | Admitting: Family Medicine

## 2022-09-18 MED ORDER — PRAVASTATIN SODIUM 40 MG PO TABS: 40.0000 mg | ORAL_TABLET | Freq: Every day | ORAL | 1 refills | Status: DC

## 2022-09-19 ENCOUNTER — Other Ambulatory Visit: Payer: Self-pay | Admitting: Family Medicine

## 2022-09-19 DIAGNOSIS — N951 Menopausal and female climacteric states: Secondary | ICD-10-CM

## 2022-09-26 ENCOUNTER — Encounter: Payer: Self-pay | Admitting: Family Medicine

## 2022-10-01 ENCOUNTER — Other Ambulatory Visit: Payer: Self-pay | Admitting: Family Medicine

## 2022-10-12 ENCOUNTER — Other Ambulatory Visit: Payer: Self-pay | Admitting: Family Medicine

## 2022-11-08 ENCOUNTER — Other Ambulatory Visit: Payer: Self-pay | Admitting: Family Medicine

## 2022-11-30 ENCOUNTER — Other Ambulatory Visit: Payer: Self-pay | Admitting: Family Medicine

## 2022-11-30 DIAGNOSIS — I10 Essential (primary) hypertension: Secondary | ICD-10-CM

## 2022-12-03 ENCOUNTER — Ambulatory Visit: Payer: BC Managed Care – PPO | Admitting: Family Medicine

## 2022-12-27 DIAGNOSIS — N6320 Unspecified lump in the left breast, unspecified quadrant: Secondary | ICD-10-CM

## 2022-12-27 HISTORY — DX: Unspecified lump in the left breast, unspecified quadrant: N63.20

## 2023-01-02 ENCOUNTER — Ambulatory Visit: Payer: BC Managed Care – PPO

## 2023-01-17 ENCOUNTER — Other Ambulatory Visit: Payer: Self-pay | Admitting: Family Medicine

## 2023-01-17 ENCOUNTER — Ambulatory Visit: Payer: BC Managed Care – PPO | Attending: Family Medicine

## 2023-01-17 DIAGNOSIS — R7989 Other specified abnormal findings of blood chemistry: Secondary | ICD-10-CM

## 2023-01-17 DIAGNOSIS — I10 Essential (primary) hypertension: Secondary | ICD-10-CM

## 2023-01-17 DIAGNOSIS — Z1231 Encounter for screening mammogram for malignant neoplasm of breast: Secondary | ICD-10-CM | POA: Insufficient documentation

## 2023-01-17 DIAGNOSIS — N951 Menopausal and female climacteric states: Secondary | ICD-10-CM

## 2023-01-17 DIAGNOSIS — Z0000 Encounter for general adult medical examination without abnormal findings: Secondary | ICD-10-CM

## 2023-01-20 ENCOUNTER — Ambulatory Visit: Payer: BC Managed Care – PPO | Admitting: Family Medicine

## 2023-01-21 ENCOUNTER — Telehealth: Payer: Self-pay | Admitting: Family Medicine

## 2023-01-21 ENCOUNTER — Ambulatory Visit: Payer: BC Managed Care – PPO | Admitting: Family Medicine

## 2023-01-21 ENCOUNTER — Other Ambulatory Visit: Payer: Self-pay | Admitting: Family Medicine

## 2023-01-21 DIAGNOSIS — R928 Other abnormal and inconclusive findings on diagnostic imaging of breast: Secondary | ICD-10-CM

## 2023-01-21 NOTE — Progress Notes (Signed)
Call to Tammy Larson. She is aware of the mammogram results. She will begin by calling the radiology center she had previously used to have comparison sent. Once the old studies are reviewed, she will follow guidance from the radiology center.

## 2023-01-21 NOTE — Telephone Encounter (Signed)
Order placed.      -Dr. Lyndel Pleasure

## 2023-01-21 NOTE — Telephone Encounter (Signed)
[facility] called said patient had the Mammo Screening and found some abnormal and will need to get the order for Diagnostic mammogram for bilateral please.  Thanks

## 2023-01-21 NOTE — Progress Notes (Signed)
Tammy Larson,  Could you please call the patient today to let her know that the mammography center would like for her to go back for further use and that she should call them to make that additional appointment.  Please tell her that if she could make her prior films available that would be extremely helpful    Thanks    Dr. Algis Downs

## 2023-01-22 ENCOUNTER — Encounter: Payer: Self-pay | Admitting: Family Medicine

## 2023-01-22 IMAGING — DX DG TOE GREAT 2+V*R*
3 series · 3 of 3 positions shown · non-contrast
Comparison: February 14, 2019.

CLINICAL DATA: Right great toe pain after injury today.

EXAM:
RIGHT GREAT TOE

[toe ap]
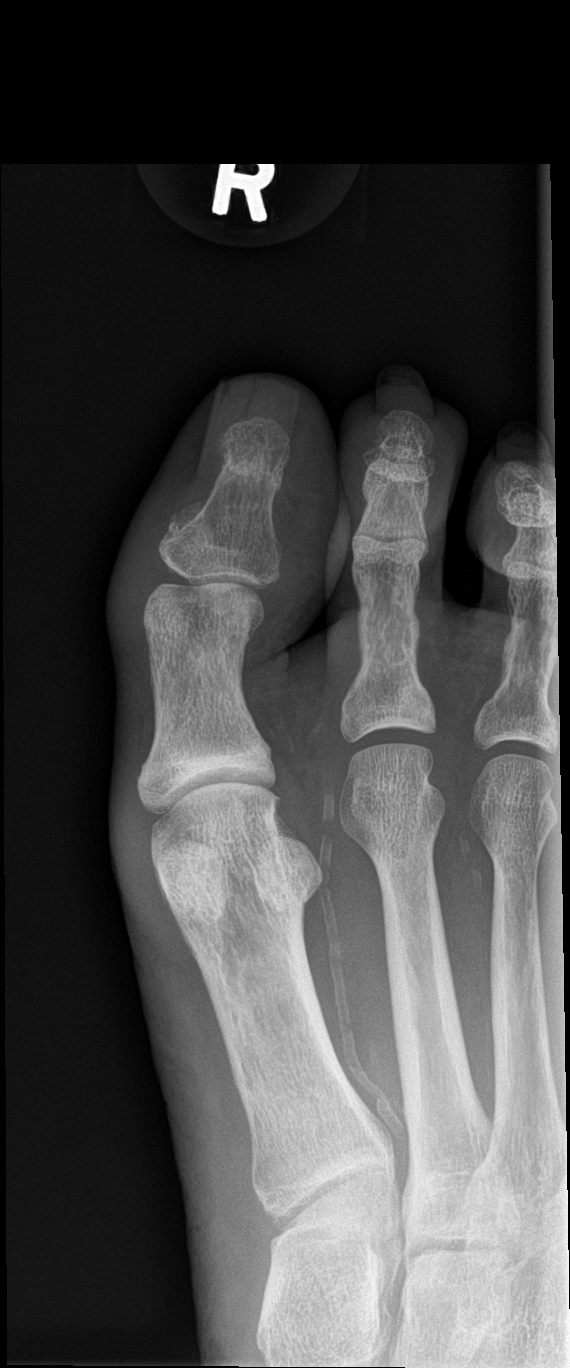

[toe obl]
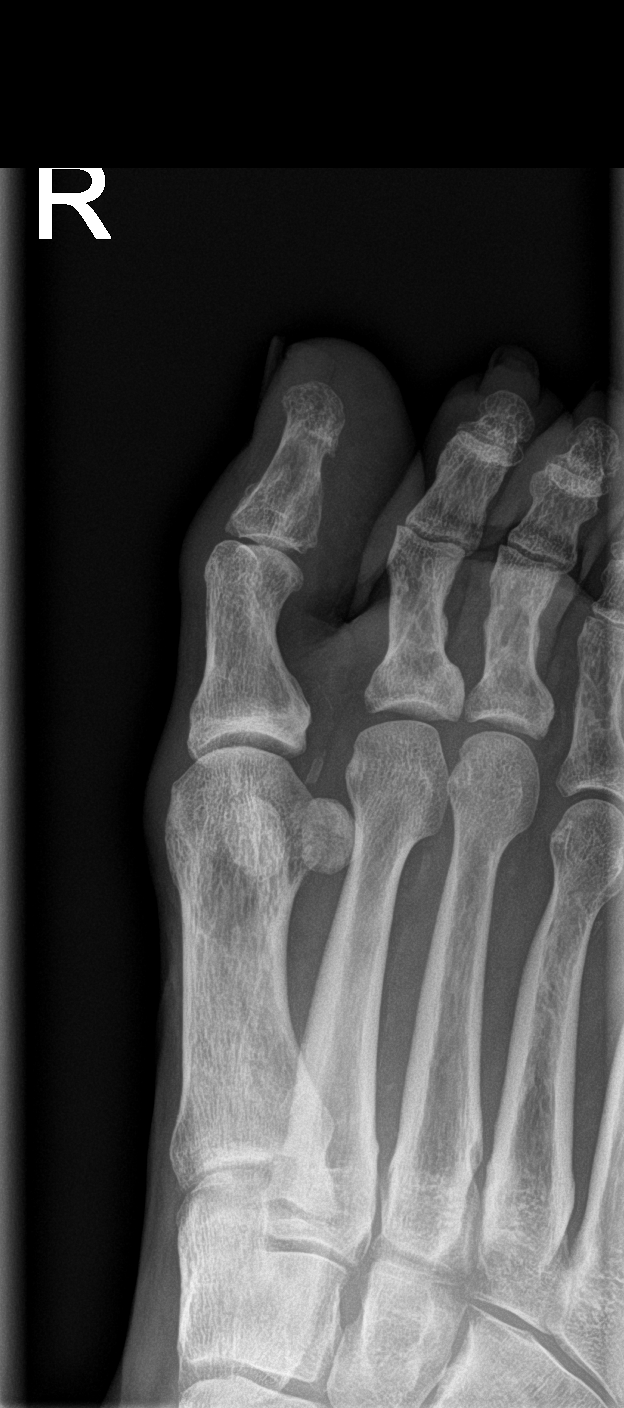

[toe lat]
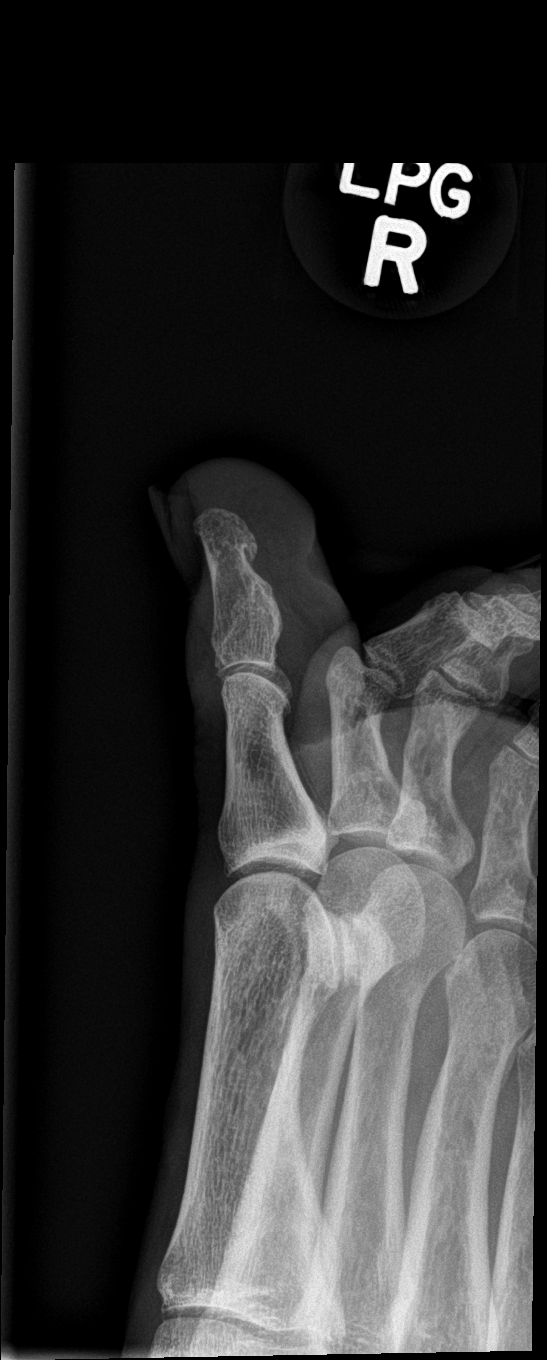

[3 of 3 positions shown; findings below may reference images not displayed]

FINDINGS: There is no evidence of fracture or dislocation. There is no
evidence of arthropathy or other focal bone abnormality. Soft
tissues are unremarkable.
IMPRESSION: Negative.

## 2023-01-22 NOTE — Progress Notes (Signed)
Spoke with patient - advised she call to schedule studies at her earliest convenience. Scheduling would be able to provide a more comprehensive listing of locations/times for her additional studies.    -Dr. Lyndel Pleasure

## 2023-01-23 ENCOUNTER — Other Ambulatory Visit: Payer: Self-pay

## 2023-01-23 ENCOUNTER — Ambulatory Visit
Admission: RE | Admit: 2023-01-23 | Discharge: 2023-01-23 | Disposition: A | Discharge status: Home / Self Care | Payer: Self-pay | Source: Ambulatory Visit

## 2023-01-26 ENCOUNTER — Encounter: Payer: Self-pay | Admitting: Podiatry

## 2023-01-26 ENCOUNTER — Ambulatory Visit: Payer: BC Managed Care – PPO | Admitting: Podiatry

## 2023-01-26 DIAGNOSIS — L600 Ingrowing nail: Secondary | ICD-10-CM

## 2023-01-27 NOTE — Progress Notes (Signed)
Subjective:   Patient ID: [name] Hurley, female   DOB: 52 y.o.   MRN: [id number]   HPI Patient states she traumatized her big toenail and was concerned because she looked down and it was gone and she does not remember hitting it on anything as far she can remember.  Patient does smoke a pack of cigarettes per day tries to be active   Review of Systems  All other systems reviewed and are negative.       Objective:  Physical Exam Vitals and nursing note reviewed.  Constitutional:      Appearance: She is well-developed.  Pulmonary:     Effort: Pulmonary effort is normal.  Musculoskeletal:        General: Normal range of motion.  Skin:    General: Skin is warm.  Neurological:     Mental Status: She is alert.     Neurovascular status intact muscle strength adequate range of motion adequate with patient found to have a traumatized left hallux probability of a subtle trauma with contusion of the nailbed no indications of proximal edema erythema noted     Assessment:  Traumatized left hallux with loss of nail bed that may have occurred in a way that she is not appreciated above     Plan:  H&P reviewed went ahead today clean the area applied sterile dressing and I think it will heal uneventfully.  I did advise on daily inspections and if any changes were to occur to let us know immediately

## 2023-01-28 ENCOUNTER — Ambulatory Visit: Payer: BC Managed Care – PPO | Admitting: Family Medicine

## 2023-01-31 ENCOUNTER — Other Ambulatory Visit: Payer: Self-pay | Admitting: Family Medicine

## 2023-02-03 ENCOUNTER — Other Ambulatory Visit: Payer: Self-pay | Admitting: Family Medicine

## 2023-02-03 ENCOUNTER — Telehealth: Payer: Self-pay

## 2023-02-03 DIAGNOSIS — R928 Other abnormal and inconclusive findings on diagnostic imaging of breast: Secondary | ICD-10-CM

## 2023-02-03 NOTE — Telephone Encounter (Signed)
Orders placed-  please forward as requested    -Dr. Lyndel Pleasure

## 2023-02-03 NOTE — Telephone Encounter (Signed)
Order faxed to [facility] [phone].

## 2023-02-03 NOTE — Telephone Encounter (Signed)
Critical finding called to [place] by Brett Fairy at [facility].    Mammogram shows bilateral breast masses for which US Guided Biopsies are recommended. Please see imaging report for specifics.    Please fax orders for US guided biopsy to the [facility] location at [phone]. She is scheduled for 02/16/2023 at 0815.

## 2023-02-11 ENCOUNTER — Ambulatory Visit: Payer: BC Managed Care – PPO | Admitting: Family Medicine

## 2023-02-16 ENCOUNTER — Other Ambulatory Visit: Payer: Self-pay | Admitting: Family Medicine

## 2023-02-19 ENCOUNTER — Encounter: Payer: Self-pay | Admitting: Family Medicine

## 2023-02-20 ENCOUNTER — Encounter: Payer: Self-pay | Admitting: Family Medicine

## 2023-02-20 ENCOUNTER — Other Ambulatory Visit: Payer: Self-pay | Admitting: Family Medicine

## 2023-02-20 ENCOUNTER — Telehealth: Payer: Self-pay

## 2023-02-20 DIAGNOSIS — Z9889 Other specified postprocedural states: Secondary | ICD-10-CM

## 2023-02-20 NOTE — Telephone Encounter (Signed)
Patient informed- regarding results.    Placed call to breast Surgery and Referral placed to breast surgery-  [place],

## 2023-02-20 NOTE — Telephone Encounter (Signed)
Abnormal Breast Biopsy. No cancer or pre-cancer. Recommendation for referral to Breast Surgeon. Path Report scanned into Epic.    Patient has NOT been informed.

## 2023-02-23 ENCOUNTER — Telehealth: Payer: Self-pay

## 2023-02-23 NOTE — Progress Notes (Unsigned)
Please email the breast Pathology results to patient    Thank you,  -Dr. Lyndel Pleasure

## 2023-02-23 NOTE — Telephone Encounter (Signed)
[facility] New Patient Coordinator Note    Physician/Location Preference:    Location Preference: [place] (patient requested a Tuesday)     Physician Preference: First available     Referral:    Referring Provider: Ponciano Ort, MD     Is Referral required per insurance? No    History:    Personal Hx of Cancer: No    Prior Chemotherapy - No  Prior Radiation - No   Prior Surgery related to Cancer - No    Family Hx of Cancer : Yes - Paternal aunt - colon cancer     Biopsy History:    Yes - [facility]     Imaging History:    Prior Imaging: Yes     Type of Imaging: Mammogram  Location Performed: [facility]    Other:     Are there patient owned records that will be brought to the first appointment?No    Has the Appointment been scheduled? Yes 8/27 with Dr. Mingo Amber

## 2023-03-06 ENCOUNTER — Other Ambulatory Visit: Payer: Self-pay | Admitting: Family Medicine

## 2023-03-06 DIAGNOSIS — N951 Menopausal and female climacteric states: Secondary | ICD-10-CM

## 2023-03-09 ENCOUNTER — Ambulatory Visit: Payer: BC Managed Care – PPO | Admitting: Family Medicine

## 2023-03-11 ENCOUNTER — Ambulatory Visit: Payer: BC Managed Care – PPO | Admitting: Family Medicine

## 2023-03-11 ENCOUNTER — Encounter: Payer: Self-pay | Admitting: Family Medicine

## 2023-03-11 VITALS — BP 147/81 | HR 91 | Ht 60.0 in | Wt 159.7 lb

## 2023-03-11 DIAGNOSIS — Z794 Long term (current) use of insulin: Secondary | ICD-10-CM

## 2023-03-11 DIAGNOSIS — E11319 Type 2 diabetes mellitus with unspecified diabetic retinopathy without macular edema: Secondary | ICD-10-CM | POA: Diagnosis not present

## 2023-03-11 DIAGNOSIS — Z1211 Encounter for screening for malignant neoplasm of colon: Secondary | ICD-10-CM

## 2023-03-11 DIAGNOSIS — I10 Essential (primary) hypertension: Secondary | ICD-10-CM | POA: Diagnosis not present

## 2023-03-11 DIAGNOSIS — E785 Hyperlipidemia, unspecified: Secondary | ICD-10-CM

## 2023-03-11 DIAGNOSIS — F17210 Nicotine dependence, cigarettes, uncomplicated: Secondary | ICD-10-CM

## 2023-03-11 LAB — POCT GLYCOSYLATED HEMOGLOBIN (HGB A1C): HbA1c, POC (controlled diabetic range): 5.9 % (ref 0.0–7.0)

## 2023-03-11 MED ORDER — AMLODIPINE BESYLATE 10 MG PO TABS: 10.0000 mg | ORAL_TABLET | Freq: Every day | ORAL | 1 refills | Status: DC

## 2023-03-11 MED ORDER — SEMAGLUTIDE (2 MG/DOSE) 8 MG/3ML ~~LOC~~ SOPN: 2.0000 mg | PEN_INJECTOR | SUBCUTANEOUS | 1 refills | Status: DC

## 2023-03-11 MED ORDER — LOSARTAN POTASSIUM 100 MG PO TABS: 100.0000 mg | ORAL_TABLET | Freq: Every day | ORAL | 3 refills | Status: DC

## 2023-03-11 MED ORDER — METFORMIN HCL 1000 MG PO TABS: ORAL_TABLET | ORAL | 0 refills | Status: DC

## 2023-03-11 MED ORDER — HYDROCHLOROTHIAZIDE 12.5 MG PO CAPS: 12.5000 mg | ORAL_CAPSULE | Freq: Every day | ORAL | 1 refills | Status: DC

## 2023-03-11 MED ORDER — TRESIBA FLEXTOUCH 100 UNIT/ML ~~LOC~~ SOPN: PEN_INJECTOR | SUBCUTANEOUS | 0 refills | Status: DC

## 2023-03-11 MED ORDER — PRAVASTATIN SODIUM 40 MG PO TABS: 40.0000 mg | ORAL_TABLET | Freq: Every day | ORAL | 1 refills | Status: AC

## 2023-03-14 NOTE — Assessment & Plan Note (Signed)
Blood sugars are much better controlled.  A1c currently 5.9%.  Continue current medications.

## 2023-03-14 NOTE — Progress Notes (Signed)
[name] Hurley - 52 y.o. female MRN [id number]  Date of birth: [birth year]  Subjective Chief Complaint  Patient presents with   Diabetes   Hypertension    HPI [name] Hurley is a 52 year old female here today for follow-up visit.  She reports she is doing pretty well at this time.  Continues on combination of amlodipine, hydrochlorothiazide and losartan for management of her hypertension.  Blood pressure is elevated today.  She has not been checking at home recently.  She did smoke just prior to her appointment.  She denies symptoms related to hypertension including chest pain, shortness of breath, palpitations, headaches or vision changes.  Currently her diabetes is managed with Ozempic 2 mg daily with Tresiba 20 units and metformin.  Tolerating medications well.  Denies hypoglycemia.  A1c is doing quite well at 5.9% today.  She is tolerating pravastatin well at this time for management of coexisting hide lipidemia.  She has not tolerated higher potency statin very well.  She does continue to smoke.  Does not want to quit at this time.    ROS:  A comprehensive ROS was completed and negative except as noted per HPI      No Known Allergies  Past Medical History:  Diagnosis Date   Diabetes mellitus    Dyspareunia    Elevated serum creatinine    Hypertension    Sciatica     Past Surgical History:  Procedure Laterality Date   CESAREAN SECTION     paragard     removal 7-16 & reinsertion 02-13-15    Social History   Socioeconomic History   Marital status: Married    Spouse name: Not on file   Number of children: 4   Years of education: 14   Highest education level: Not on file  Occupational History   Occupation: Nutritition   Tobacco Use   Smoking status: Every Day    Current packs/day: 1.00    Average packs/day: 1 pack/day for 22.0 years (22.0 ttl pk-yrs)    Types: Cigarettes   Smokeless tobacco: Never  Substance and Sexual Activity   Alcohol use: Not Currently    Drug use: No   Sexual activity: Yes    Partners: Male    Birth control/protection: I.U.D.  Other Topics Concern   Not on file  Social History Narrative   Born and raised in [place]. Fun: Sleep   Denies any religious beliefs effecting health care.    Social Determinants of Health   Financial Resource Strain: Not on file  Food Insecurity: Not on file  Transportation Needs: Not on file  Physical Activity: Not on file  Stress: Not on file  Social Connections: Not on file    Family History  Problem Relation Age of Onset   Hypertension Father    COPD Father    Emphysema Father    Diabetes Maternal Aunt    Hypertension Mother    Hyperlipidemia Mother    Colon cancer Mother    COPD Paternal Grandfather    Hypertension Brother    Heart disease Neg Hx     Health Maintenance  Topic Date Due   HIV Screening  Never done   Diabetic kidney evaluation - Urine ACR  02/04/2023   PAP SMEAR-Modifier  04/12/2023   OPHTHALMOLOGY EXAM  06/11/2023 (Originally 10/23/2022)   COVID-19 Vaccine (4 - 2023-24 season) 06/12/2023 (Originally 03/28/2022)   INFLUENZA VACCINE  10/26/2023 (Originally 02/26/2023)   MAMMOGRAM  03/10/2024 (Originally 10/12/2021)   Fecal DNA (Cologuard)  03/10/2024 (  Originally 03/08/2023)   Diabetic kidney evaluation - eGFR measurement  09/09/2023   FOOT EXAM  09/09/2023   HEMOGLOBIN A1C  09/11/2023   Lung Cancer Screening  09/18/2023   DTaP/Tdap/Td (2 - Td or Tdap) 02/19/2030   Hepatitis C Screening  Completed   Zoster Vaccines- Shingrix  Completed   HPV VACCINES  Aged Out     ----------------------------------------------------------------------------------------------------------------------------------------------------------------------------------------------------------------- Physical Exam BP (!) 147/81   Pulse 91   Ht 5' (1.524 m)   Wt 159 lb 11.2 oz (72.4 kg)   SpO2 99%   BMI 31.19 kg/m   Physical Exam Constitutional:      Appearance: Normal appearance.   HENT:     Head: Normocephalic and atraumatic.  Eyes:     General: No scleral icterus. Cardiovascular:     Rate and Rhythm: Normal rate and regular rhythm.  Pulmonary:     Effort: Pulmonary effort is normal.     Breath sounds: Normal breath sounds.  Neurological:     General: No focal deficit present.     Mental Status: She is alert.  Psychiatric:        Mood and Affect: Mood normal.        Behavior: Behavior normal.     ------------------------------------------------------------------------------------------------------------------------------------------------------------------------------------------------------------------- Assessment and Plan  Essential hypertension Blood pressure is elevated today.  She will continue current medications.  Asked her to check her blood pressure at home or return in approximately 2 to 3 weeks for nurse visit to recheck blood pressure.  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  A1c currently 5.9%.  Continue current medications.  Cigarette nicotine dependence without complication Continues to smoke.  Does not want to quit at this time.  Dyslipidemia Continue pravastatin at current strength.   Meds ordered this encounter  Medications   amLODipine (NORVASC) 10 MG tablet    Sig: Take 1 tablet (10 mg total) by mouth daily.    Dispense:  90 tablet    Refill:  1   hydrochlorothiazide (MICROZIDE) 12.5 MG capsule    Sig: Take 1 capsule (12.5 mg total) by mouth daily.    Dispense:  90 capsule    Refill:  1   insulin degludec (TRESIBA FLEXTOUCH) 100 UNIT/ML FlexTouch Pen    Sig: Inject 20 units daily    Dispense:  15 mL    Refill:  0   losartan (COZAAR) 100 MG tablet    Sig: Take 1 tablet (100 mg total) by mouth daily.    Dispense:  90 tablet    Refill:  3   metFORMIN (GLUCOPHAGE) 1000 MG tablet    Sig: TAKE 1 TABLET BY MOUTH TWICE DAILY WITH A MEAL    Dispense:  180 tablet     Refill:  0   pravastatin (PRAVACHOL) 40 MG tablet    Sig: Take 1 tablet (40 mg total) by mouth daily.    Dispense:  90 tablet    Refill:  1   Semaglutide, 2 MG/DOSE, 8 MG/3ML SOPN    Sig: Inject 2 mg as directed once a week.    Dispense:  9 mL    Refill:  1    Return in about 6 months (around 09/11/2023) for HTN/T2DM.    This visit occurred during the SARS-CoV-2 public health emergency.  Safety protocols were in place, including screening questions prior to the visit, additional usage of staff PPE, and extensive cleaning of exam room while observing appropriate contact time as  indicated for disinfecting solutions.

## 2023-03-14 NOTE — Assessment & Plan Note (Signed)
Blood pressure is elevated today.  She will continue current medications.  Asked her to check her blood pressure at home or return in approximately 2 to 3 weeks for nurse visit to recheck blood pressure.

## 2023-03-14 NOTE — Assessment & Plan Note (Signed)
Continues to smoke.  Does not want to quit at this time.

## 2023-03-14 NOTE — Assessment & Plan Note (Signed)
Continue pravastatin at current strength. 

## 2023-03-24 ENCOUNTER — Encounter (INDEPENDENT_AMBULATORY_CARE_PROVIDER_SITE_OTHER): Payer: Self-pay | Admitting: Surgery

## 2023-03-24 ENCOUNTER — Ambulatory Visit (INDEPENDENT_AMBULATORY_CARE_PROVIDER_SITE_OTHER): Payer: BC Managed Care – PPO | Admitting: Surgery

## 2023-03-24 VITALS — BP 159/85 | HR 74 | Temp 98.2°F | Resp 18 | Ht 65.0 in | Wt 173.4 lb

## 2023-03-24 DIAGNOSIS — N649 Disorder of breast, unspecified: Secondary | ICD-10-CM | POA: Insufficient documentation

## 2023-03-24 DIAGNOSIS — Z9889 Other specified postprocedural states: Secondary | ICD-10-CM

## 2023-03-24 NOTE — Progress Notes (Signed)
Breast Consultation Note    Tammy Larson is a 52 y.o. female referred by Ponciano Ort MD presenting with a left breast nodular sclerosing lesion.   She reports she has felt a lower inner breast lump for the past 8-10 years which has undergone surveillance for that time as well. She reports it has not grown over time, but that it is tender around her periods.     On 01/23/23 she underwent a BLscrMG which showed right breast masses and a left breast posterior mass with inferior asymmetry, BIRADS 0. On 02/03/23 she underwent a BLdxMG and BL targeted US. The MG showed a right UOQ persistent mass, a right lower central persistent small mass, a left posterior inner central waxing and waning suspected mass, left lower central/inner focal asymmetry which is less prominent and unchanged compared to priors. The US showed a right 10N4 benign group of cysts, a right 10N6 stable small hypoechoic mass with central calc measuring 0.6 cm, a right lower central 5N6 horizontal complicated cyst measuring 0.8 cm, a left inner central 8N10 horizontal gently lobulated mass that is hypoechoic measuring 1.1 cm and appears midly increased in size compared to 2014. US biopsy was recommended for the right complicated cyst at 5N6 and left 8N10 mass. On On 02/16/23 she underwent a left 8N10 [place] CNB with pathology demosntrating a nodular sclerosing lesion. Pathologist comments note there is a minor papillary component and the differential includes a sclerosing papilloma. Of note, the right 5N6 mass was not biopsied as it was felt to be consistent with focal benign fibrocystic changes, not indicated for biopsy.     FHx: No breast, ovarian or other cancers in the family.     Genetic Testing: none  Breast surgeries: none  Menarche at age: 24   Menopause at age: 23  Parity: 3  1st child at age: 24  HRT: estradiol patch x 2 years for symptoms of menopause  XRT: none    Breast Imaging  02/03/23 BLdxMG: In the anterior upper outer right breast,  there is a persistent mass. In  the middle to posterior third of the lower central right breast, there is a  persistent small mass.      In the posterior inner central left breast, there is a waxing and waning  suspected mass. The focal asymmetry in the lower central/lower inner left  breast appears less prominent and unchanged compared to prior older  mammograms, compatible with summation of pre-existing benign structures.  BL [place]: In the right upper outer quadrant in the 10:00 region approximately 4 cm from the nipple, there is a benign group of cysts corresponding to the recent mammographic finding measuring 1 x 1.1 x 0.4 cm. Elsewhere in the upper outer right breast 10:00 axis approximately 6 cm from the nipple,  there is a small hypoechoic mass with a central calcification measuring approximately 0.5 x 0.6 x 0.5 cm. This latter mass is stable in appearance mammographically compared to multiple prior older mammograms compatible with a small benign fibroadenoma.     In the lower central right breast in the 5:00 region approximately 6 cm from the nipple, there is a horizontal complicated cyst measuring 0.5 x 0.8 x 0.4 cm.     In the inner central left breast at 8:00 approximately 10 cm from the nipple, there is a horizontal gently lobulated mass that is hypoechoic in echotexture measuring 1.1 x 0.4 x 0.9 cm. Patient states that this mass has been stable clinically for many years. However,  compared to older ultrasound of 03/02/2013, this mass has mildly increased in size.     Ultrasound of the left lower inner/lower central left breast reveals dense glandular tissue without sonographic evidence of a worrisome discrete solid or cystic mass and no suspicious areas of shadowing.  IMPRESSION:      1. Ultrasound-guided biopsy recommended for right complicated cyst in right  breast at 5:00 approximately 6 cm from the nipple measuring 0.8 x 0.4 x 0.5  cm  2. Ultrasound-guided biopsy recommended for left breast mass at  8:00  approximately 10 cm from nipple, measuring 1.1 x 0.4 x 0.9 cm.  3. Scattered benign findings elsewhere in both breasts, as detailed above.    01/23/23 Mammo comparison: BIRADS 0. Comparison made with prior studies dated 09/14/2021, 12/27/2019,  11/13/2017, 02/21/2016.   Right breast: Masses stereo warrants further evaluation.     Left breast: Posterior mass as well as inferior asymmetry still warrants  further evaluation.     IMPRESSION:  Left breast asymmetry and bilateral masses for which further evaluation is  recommended. We will recall the patient for additional evaluation and, at  that time, a final recommendation regarding management will be made.  01/17/23 BLscrMG:  BIRADS 0. Right breast: Masses noted in the anterior lateral breast and inferior 6:00  position.     Left breast: Mass suggested in the posterior medial breast. Focal asymmetry  in the lower inner quadrant.     IMPRESSION:      Left breast asymmetry and bilateral masses for which further evaluation is  recommended. We will recall the patient for additional evaluation and, at  that time, a final recommendation regarding management will be made.     Note:  If the patient's prior films are made available, a comparison can be  made and an addendum issued.    Breast Pathology  02/16/23 Left breast 8N10: nodular sclerosing lesion. See comments: There is a minor papillary component. Differential includes sclerosing papilloma. Staining consisting with non-atypical epithelial proliferation. Clinical/radiologic correlation and appropriate follow up are recommended. concordant    Family History   Problem Relation Age of Onset    Hypertension Mother     Parkinsonism Father     Hypertension Brother     Cancer Paternal Aunt         Colon Cancer    Diabetes Paternal Uncle     Diabetes Paternal Grandmother        Problem List[1]    Past Surgical History:   Procedure Laterality Date    ENDOMETRIAL ABLATION      EXCISION BIOPSY WITH NEEDLE LOCALIZATION       HYSTERECTOMY  2017    HYSTEROSCOPY, MYOSURE MORCELLATION N/A 12/23/2013    Procedure: Hysteroscopic Myomectomy with Myosure;  Surgeon: Waldon Merl, MD;  Location: [facility];  Service: Gynecology;  Laterality: N/A;    LAPAROSCOPY, DIAGNOSTIC         Current Medications[2]    Allergies[3]    ROS:   Review of Systems   Constitutional:  Negative for chills, fever and weight loss.   HENT:  Negative for congestion, ear pain, hearing loss, nosebleeds, sinus pain, sore throat and tinnitus.    Eyes:  Negative for double vision, pain, discharge and redness.   Respiratory:  Negative for wheezing.    Cardiovascular:  Negative for chest pain. Negative for palpitations and leg swelling.   Gastrointestinal:  Negative for abdominal pain, blood in stool, diarrhea, heartburn, nausea and vomiting.  Genitourinary:  Negative for dysuria, flank pain and hematuria.   Musculoskeletal:  Negative for back pain, myalgias and neck pain.   Skin:  Negative for itching and rash.   Neurological:  Negative for dizziness, tingling, tremors, seizures, weakness and headaches.   Psychiatric/Behavioral:  The patient is not nervous/anxious.      Vitals:    03/24/23 1120   BP: 159/85   Pulse: 74   Resp: 18   Temp: 98.2 F (36.8 C)   SpO2: 99%     Examination:  Constitutional:       Appearance: Normal appearance. Normal weight.   Neck:   No thyromegaly, no thyroid nodules. Trachea is midline.   Eyes:      Pupils: Pupils are equal, round, and reactive to light.   Cardiovascular:      Rate and Rhythm: Normal rate and regular rhythm.   Pulmonary:      Effort: Pulmonary effort is normal.      Breath sounds: Normal breath sounds.   Chest:   Breasts:      Right: No inverted nipple, mass, nipple discharge or skin change.      Left: 8N10 small oval mobile, soft mass. No inverted nipple, mass, nipple discharge or skin change.   Abdominal:      General: Abdomen is flat.      Palpations: Abdomen is soft.      Tenderness: There is no abdominal tenderness.    Musculoskeletal:         General: Normal range of motion.      Cervical back: Normal range of motion and neck supple.   Lymphadenopathy:    Neck: No cervical lymphadenopathy      Right upper body: No supraclavicular or axillary adenopathy.      Left upper body: No supraclavicular or axillary adenopathy.   Skin:     General: Skin is warm and dry.   Neurological:      General: No focal deficit present.      Mental Status: She is alert and oriented to person, place, and time.   Psychiatric:         Mood and Affect: Mood normal.         Behavior: Behavior normal.     Impression: 86F with a left nodular sclerosing lesion with minor papillary component    In office US demonstrates a HM clip within the palpable 8N10 lesion.     Recommend: I explained that Tammy Larson's biopsy shows a nodular sclerosing lesion with a possible papillary component. This is a benign lesion. We discussed that papillomas are associated with a small risk of upgrade to atypia or cancer after excisional biopsy.     Her options include excision and routine surveillance. The risks of surgery include bleeding, infection, and change in contour/shape or sensation of the breast.     I have perosnally reviewed her breast imaging and the lesion does not have any highly suspicious features, it was biopsied for minor growth over 10 years. Given these reassuring findings, I recommend surveillance- she may resume annual screening. Tammy Larson understands the risks of surgery and her alternatives and would like to proceed with surveillance.     - May resume routine breast screening   - Return to see me if the lump grows on self exam or there are suspicious changes on subsequent imaging  - We also discussed the slightly higher risk of breast cancer on HRT.    Jacqualine Mau,  MD  Breast and Endocrine Surgeon   [facility] (breast): [phone]  [facility] (endocrine): [phone]         [1]   Patient Active Problem List  Diagnosis    Uterine leiomyoma     Primary hypertension    Perimenopausal vasomotor symptoms    Lesion of breast   [2]   Current Outpatient Medications   Medication Sig Dispense Refill    B Complex Vitamins (VITAMIN B COMPLEX) Tab Take 1 tablet by mouth daily      Biotin w/ Vitamins C & E (HAIR/SKIN/NAILS PO) Take 1 tablet by mouth daily      CALCIUM-MAGNESIUM-ZINC PO Take 1 tablet by mouth daily      Cholecalciferol (D3 PO) Take 5,000 IU by mouth daily      estradiol (CLIMARA) 0.05 MG/24HR APPLY 1 PATCH ONCE A WEEK 4 patch 1    fluticasone (FLONASE) 50 MCG/ACT nasal spray 1 SPRAY BY NASAL ROUTE AS NEEDED FOR ALLERGIES AS NEEDED 16 mL 0    Multiple Vitamin (MULTIVITAMIN ADULT PO) Take by mouth      Probiotic Product (PROBIOTIC PO) Take 1 tablet by mouth daily      valsartan-hydroCHLOROthiazide (DIOVAN-HCT) 80-12.5 MG per tablet TAKE 1 TABLET BY MOUTH EVERY DAY 90 tablet 1    Jublia 10 % Solution Apply topically daily To affected area (Patient not taking: Reported on 03/24/2023)      Omega-3 Fatty Acids (Omega-3 Fish Oil) 500 MG Cap Take 2 capsules (1,000 mg) by mouth daily (Patient not taking: Reported on 03/24/2023)       No current facility-administered medications for this visit.   [3] No Known Allergies

## 2023-04-07 ENCOUNTER — Other Ambulatory Visit: Payer: Self-pay | Admitting: Family Medicine

## 2023-04-07 DIAGNOSIS — N951 Menopausal and female climacteric states: Secondary | ICD-10-CM

## 2023-04-07 MED ORDER — ESTRADIOL 0.05 MG/24HR TD PTWK
MEDICATED_PATCH | TRANSDERMAL | 1 refills | Status: DC
Start: 2023-04-07 — End: 2023-06-22

## 2023-04-16 ENCOUNTER — Ambulatory Visit (INDEPENDENT_AMBULATORY_CARE_PROVIDER_SITE_OTHER): Payer: BC Managed Care – PPO

## 2023-04-16 ENCOUNTER — Encounter (INDEPENDENT_AMBULATORY_CARE_PROVIDER_SITE_OTHER): Payer: Self-pay | Admitting: Family Medicine

## 2023-04-16 ENCOUNTER — Ambulatory Visit (INDEPENDENT_AMBULATORY_CARE_PROVIDER_SITE_OTHER): Payer: BC Managed Care – PPO | Admitting: Family Medicine

## 2023-04-16 VITALS — BP 133/86 | HR 63

## 2023-04-16 DIAGNOSIS — M170 Bilateral primary osteoarthritis of knee: Secondary | ICD-10-CM

## 2023-04-16 DIAGNOSIS — M25562 Pain in left knee: Secondary | ICD-10-CM

## 2023-04-16 DIAGNOSIS — M25561 Pain in right knee: Secondary | ICD-10-CM

## 2023-04-16 DIAGNOSIS — G8929 Other chronic pain: Secondary | ICD-10-CM

## 2023-04-16 MED ORDER — MELOXICAM 15 MG PO TABS
15.0000 mg | ORAL_TABLET | Freq: Every day | ORAL | 0 refills | Status: DC | PRN
Start: 2023-04-16 — End: 2023-08-29

## 2023-04-16 NOTE — Progress Notes (Signed)
[facility]                  Date of Exam: 04/16/2023 1:16 PM      Patient ID: Tammy Larson is a 52 y.o. female.  Attending Physician: Hanley Ben, DO      Chief Complaint:   Chief Complaint   Patient presents with    Knee Pain            HPI:   HPI  NP here for R>L knee pain. Ongoing for years on and off but R has been worsening recently. Pain in anterior knee and aggravated with walking up stairs. Notes short 2 week period where pain radiated laterally below R knee. Denies numb/ting. Denies mechanical symptoms. Has tried Voltaren gel with mild relief. No prior sx, CSI, or PT.      ROS:   As per HPI.  Otherwise as below.  Review of Systems   Constitutional:  Negative for activity change and chills.   HENT:  Negative for congestion.    Respiratory:  Negative for chest tightness and shortness of breath.    Cardiovascular:  Negative for chest pain and palpitations.   Gastrointestinal:  Negative for abdominal pain and constipation.   Genitourinary:  Negative for difficulty urinating and dysuria.           Problem List:   Problem List[1]     Current Meds:   Medications Taking[2]      Allergies:   Allergies[3]     Past Surgical History:   Past Surgical History[4]       Family History:   Family History[5]       Social History:   Social History[6]       The following sections were reviewed this encounter by the provider:             Vital Signs:   BP 133/86   Pulse 63   LMP 12/11/2013   SpO2 97%           Physical Exam:   Physical Exam  Vitals and nursing note reviewed.   Constitutional:       General: She is not in acute distress.     Appearance: Normal appearance. She is not ill-appearing.   HENT:      Head: Normocephalic and atraumatic.      Right Ear: External ear normal.      Left Ear: External ear normal.      Nose: Nose normal. No congestion.      Mouth/Throat:      Mouth: Mucous membranes are moist.   Eyes:      Pupils: Pupils are equal, round, and reactive to  light.   Musculoskeletal:      Comments: Full AROM of the knees.  No effusion.  No medial or lateral joint line tenderness.  + b/l patellar grind   Neurological:      Mental Status: She is alert.   Psychiatric:         Mood and Affect: Mood normal.               Procedure(s):   Procedures        Radiology:   X-rays obtained today and reviewed.  3 views of the knees.  Mild patellofemoral DJD.         Assessment:   1. Chronic pain of both knees  - XR Knee Right 4+ Views  - meloxicam (MOBIC) 15 MG  tablet; Take 1 tablet (15 mg) by mouth daily as needed for Pain  Dispense: 90 tablet; Refill: 0  - Ambulatory referral to Physical Therapy; Future    2. Primary osteoarthritis of both knees           Plan:   1. Chronic pain of both knees    2. Primary osteoarthritis of both knees       Treatment options discussed with patient at length, including risks, benefits, alternatives, and the nature of any potential procedures for the problem. Mobic and PT ordered         Follow-up:   Return in about 2 months (around 06/16/2023).          Lonell Face II, DO              [1]   Patient Active Problem List  Diagnosis    Uterine leiomyoma    Primary hypertension    Perimenopausal vasomotor symptoms    Lesion of breast   [2]   Outpatient Medications Marked as Taking for the 04/16/23 encounter (Office Visit) with Hanley Ben, DO   Medication Sig Dispense Refill    Biotin w/ Vitamins C & E (HAIR/SKIN/NAILS PO) Take 1 tablet by mouth daily      CALCIUM-MAGNESIUM-ZINC PO Take 1 tablet by mouth daily      Cholecalciferol (D3 PO) Take 5,000 IU by mouth daily      estradiol (CLIMARA) 0.05 MG/24HR APPLY 1 PATCH ONCE A WEEK 4 patch 1    fluticasone (FLONASE) 50 MCG/ACT nasal spray 1 SPRAY BY NASAL ROUTE AS NEEDED FOR ALLERGIES AS NEEDED 16 mL 0    Multiple Vitamin (MULTIVITAMIN ADULT PO) Take by mouth      Probiotic Product (PROBIOTIC PO) Take 1 tablet by mouth daily      valsartan-hydroCHLOROthiazide (DIOVAN-HCT) 80-12.5 MG per tablet  TAKE 1 TABLET BY MOUTH EVERY DAY 90 tablet 1   [3] No Known Allergies  [4]   Past Surgical History:  Procedure Laterality Date    ENDOMETRIAL ABLATION      EXCISION BIOPSY WITH NEEDLE LOCALIZATION      HYSTERECTOMY  2017    HYSTEROSCOPY, MYOSURE MORCELLATION N/A 12/23/2013    Procedure: Hysteroscopic Myomectomy with Myosure;  Surgeon: Waldon Merl, MD;  Location: [facility];  Service: Gynecology;  Laterality: N/A;    LAPAROSCOPY, DIAGNOSTIC     [5]   Family History  Problem Relation Name Age of Onset    Hypertension Mother      Parkinsonism Father      Hypertension Brother      Cancer Paternal Aunt Judieth Keens         Colon Cancer    Diabetes Paternal Uncle      Diabetes Paternal Grandmother     [6]   Social History  Tobacco Use    Smoking status: Never    Smokeless tobacco: Never   Vaping Use    Vaping status: Never Used   Substance Use Topics    Alcohol use: Yes     Alcohol/week: 2.0 standard drinks of alcohol     Types: 1 Glasses of wine, 1 Drinks containing 0.5 oz of alcohol per week     Comment: socially/varies    Drug use: No

## 2023-04-17 ENCOUNTER — Encounter (INDEPENDENT_AMBULATORY_CARE_PROVIDER_SITE_OTHER): Payer: Self-pay | Admitting: Family Medicine

## 2023-04-27 ENCOUNTER — Telehealth: Payer: Self-pay

## 2023-04-27 NOTE — Telephone Encounter (Signed)
Initiated Prior authorization ZOX:[id number] (2 MG/DOSE) 8MG /3ML pen-injectors Via: Covermymeds Case/Key:[id number] Status: pending approved  as of 04/27/23 Reason: Notified Pt via: Mychart

## 2023-05-03 LAB — COLOGUARD

## 2023-05-04 ENCOUNTER — Encounter: Payer: Self-pay | Admitting: Rehabilitative and Restorative Service Providers"

## 2023-05-04 ENCOUNTER — Inpatient Hospital Stay: Payer: BC Managed Care – PPO | Attending: Family Medicine | Admitting: Rehabilitative and Restorative Service Providers"

## 2023-05-04 ENCOUNTER — Ambulatory Visit: Payer: BC Managed Care – PPO | Admitting: Obstetrics and Gynecology

## 2023-05-04 VITALS — BP 167/94

## 2023-05-04 DIAGNOSIS — M25562 Pain in left knee: Secondary | ICD-10-CM | POA: Insufficient documentation

## 2023-05-04 DIAGNOSIS — M25561 Pain in right knee: Secondary | ICD-10-CM | POA: Insufficient documentation

## 2023-05-04 DIAGNOSIS — G8929 Other chronic pain: Secondary | ICD-10-CM | POA: Insufficient documentation

## 2023-05-04 DIAGNOSIS — M6281 Muscle weakness (generalized): Secondary | ICD-10-CM | POA: Insufficient documentation

## 2023-05-04 NOTE — Progress Notes (Signed)
Name:Tammy Larson Age: 52 y.o.   Date of Service: 05/04/2023  Referring Physician: Hanley Ben, DO   Date of Injury: No data found 04/04/2023  PT Date Care Plan Established/Reviewed:05/04/2023  PT Date Treatment Started:05/04/2023  OT Date Care Plan Established/Reviewed: No data found  OT Date Treatment Started: No data found    (Historic) Date of Injury:No data was found  (Historic) Date Care Plan Established/Reviewed No data was found  No data was found  (Historic) Date Treatment Started No data was found No data was found    End of Certification Date: 08/01/2023  Sessions in Plan of Care: 12  Surgery Date: No data was found  MD Follow-up: No data was found  Medbridge Code: No data was found    Visit Count: 1   Diagnosis:    Diagnosis ICD-10-CM Associated Order   1. Acute pain of right knee  M25.561       2. Acute pain of left knee  M25.562       3. Muscle weakness (generalized)  M62.81           Subjective     History of Present Illness   History of Present Illness: Patient reports chronic history BIL knee pain. However, about 1 month ago she started experiencing pain on the R lower leg that radiates down the lower leg (knee down to ankle). The pain varies in location. Between her knee and her ankle. Described as achy in nature. 3-4/10. Never gone above 4/10. Denies numbness/tingling or significant weakness. May be more noticeable when sitting versus moving around. Otherwise hard to identify a trigger for the radiating pain in lower R leg. Pain that has been chronic in nature in BIL knees is triggered by activities such as squatting and lunging.    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 Tu,Thu she does cardio."    PT goal:  "My goal would be that I don't feel pain in the knee when doing workouts at the gym and ultimately for the pain in my lower leg to go away"    Red Flags (bowel/bladder, LOB/weakness/falls): none   Functional Limitations (PLOF): Pain with participating in normal exercise routine including squats and lunges (No issues)  Pain in R knee with going up and down stairs (No issues)  Pain with squatting down to the ground to pick something up (No issues)  Symptoms more noticeable with sitting versus moving around (No issues)    Outcome Measure   Tool Used/Details: FOTO  Score: 67  Predicted Functional Outcome: 76    Social Support/Occupation              Occupation: Administrator (desk work)           Precautions: No data was found  Allergies: Allergies[1]    Medical History[2]    Objective     Active Range of Motion (degrees)     Left Knee   Flexion: 130 degrees   Extension: -2 degrees     Right Knee   Flexion: 130 degrees   Extension: -2 degrees     Strength/Myotome Testing (/5)     Left Hip  Planes of Motion   Flexion (L1/2): 5  Extension: 4+  Abduction: 4+  External rotation: 5  Internal rotation: 5    Isolated Muscles   Gluteus maximus: 4-    Right Hip   Planes of Motion   Flexion (L1/2): 5  Extension: 4+  Abduction: 4+  External rotation: 4+  Internal rotation: 4+    Isolated Muscles   Gluteus maximums: 4-    Left Knee   Flexion (S1): 5  Extension (L3): 5    Right Knee   Flexion (S1): 5  Extension (L3): 5    Palpation (-) TTP at BIL med/lat jt line  (-) TTP at BIL patellar tendon     Tests       Thoracic   Positive slump.   Lumbar/Pelvic Girdle/Sacrum   Positive slump.    Lumbar Tests Comments: (+) reproduction of chief complaint on R with slump test       Left Knee   Negative Apley's compression.     Right Knee   Negative Apley's compression.     Patellar Mobility   Left Knee Patellar tendons within functional limits include the medial.     Right Knee Patellar tendons within functional limits include the  medial.     Neurological Testing     Sensation     Knee   Left Knee   Intact: Light touch    Right Knee   Intact: light touch             (+) radiation of pain in to R LE with PA glide @ L3-4     BP: (!) 167/94 (white coat syndrome)        Treatment     Therapeutic Exercises - Justified to address any of the following:  To develop strength, endurance, ROM and/or flexibility.   AUTH AFTER EVAL    R supine sciatic n glide x 10 with cues for setup and technique       ---      Flowsheet Row ---   Total Time    Timed Minutes 3 minutes   Untimed Minutes 35 minutes   Total Time 38 minutes             Assessment   Lennix is a 52 y.o. female presenting with BIL knee DJD and a new onset of radiating pain in to R lower leg which appears to be radicular in nature who requires skilled Physical Therapy services.    Clinical presentation: stable - local pain without causing other issues/symptoms  Barriers to therapy: N/A    Functional Limitations (PLOF): Pain with participating in normal exercise routine including squats and lunges (No issues)  Pain in R knee with going up and down stairs (No issues)  Pain with squatting down to the ground to pick something up (No issues)  Symptoms more noticeable with sitting versus moving around (No issues)  Prognosis: excellent  Patient is aware of diagnosis, prognosis and consents to plan of care: Yes  Plan   Visits per week: 2  Number of Sessions: 12  Direct One on One  16109: Therapeutic Exercise: To Develop Strength and Endurance, ROM and Flexibility  [id number]: Gait Training  60454: Neuromuscular Reeducation (Proprioceptive Neuromuscular Facilitation)  [phone]: Self Care/Home Mgmt Training (ADLs, safety procedures, use of assistive devices)  97140: Manual Therapy techniques (mobilization, manipulation, manual traction) (Grade I-V to patellofemoral joint, tibiofemoral joint, ankle, hip, and regionally interdependent joints, soft tissue mobilization, instrument assisted soft tissue  mobilization.)  97530: Therapeutic Activities: Dynamic activities to improve functional performance  Dry Needling  Plan for Next Session -: R sciatic nerve glides, R seated slump gliders, BIL hip strengthening (focused on hip abd and ext), assess squat mechanics, assess lumbar AROM    Goals      Goal 1: STG:  Patient will be able to ascend/descend stairs with 0/10 knee pain   Sessions: 4      Goal 2: Patient will demonstrate independence in prescribed HEP with proper form, sets and reps for safe discharge to an independent program.      Sessions: 12      Goal 3: Pt will score >/= 78/100 on FOTO FS Primary measure vs 67/100 @ IE, demonstrating improved functional mobility for return to PLOF     Sessions: 12      Goal 4: Patient will demonstrate 5/5 MMT hip extension strength to facilitate squatting and lunging as required for both functional and gym related activities without compensation      Sessions: 12                                  Gerome Sam, DPT      Parts of this note were generated by the Epic EMR system/Dragon speech recognition and may contain inherent errors or omissions not intended by the user. Grammatical errors, random word insertions, deletions, pronoun errors and incomplete sentences are occasional consequences of this technology due to software limitations. Not all errors are caught or corrected.  If there are questions or concerns about the content of this note or information contained within the body of this dictation they should be addressed directly with the author for clarification.         [1] No Known Allergies  [2]   Past Medical History:  Diagnosis Date   . Abnormal vision     eyeglasses   . Anemia    . Hypertension    . Pain in joint    . Post-operative nausea and vomiting     apfel=3

## 2023-05-11 ENCOUNTER — Inpatient Hospital Stay: Payer: BC Managed Care – PPO

## 2023-05-18 ENCOUNTER — Inpatient Hospital Stay: Payer: BC Managed Care – PPO | Admitting: Rehabilitative and Restorative Service Providers"

## 2023-05-19 ENCOUNTER — Telehealth: Payer: Self-pay | Admitting: Family Medicine

## 2023-05-19 ENCOUNTER — Inpatient Hospital Stay: Payer: BC Managed Care – PPO

## 2023-05-19 NOTE — Telephone Encounter (Signed)
From: Charlett Blake   Sent: Tuesday, May 19, 2023 3:50 PM  To: '[email or web address]  Subject: [place] [facility] - virtual consultation confirmation    Good afternoon Mrs. Madero, ??  Our team has you confirmed for a complimentary virtual consultation via MyChart on Friday 08/07/2023 at 9:00am with Dr. Sula Soda. Access to MyChart FAQ's and assistance.  At the time of your consultation, complete all pre-check-in and registration paperwork.   Patients may be asked to upload a photo of your photo ID and insurance card.   If you need any assistance connecting at the time of your consultation, contact our team at [phone], for immediate assistance.   We look forward to welcoming you,     Margarita Rana  Patient Access Associate 3   [phone] [facility]   [address]  [place], [place] [address]  [place] [phone]  F [phone]  [email or web address]      [facility]

## 2023-05-29 ENCOUNTER — Other Ambulatory Visit: Payer: Self-pay | Admitting: Family Medicine

## 2023-05-29 MED ORDER — FLUTICASONE PROPIONATE 50 MCG/ACT NA SUSP
1.0000 | NASAL | 0 refills | Status: DC | PRN
Start: 2023-05-29 — End: 2023-08-03

## 2023-06-01 ENCOUNTER — Inpatient Hospital Stay: Payer: BC Managed Care – PPO

## 2023-06-03 ENCOUNTER — Ambulatory Visit (INDEPENDENT_AMBULATORY_CARE_PROVIDER_SITE_OTHER): Payer: BC Managed Care – PPO | Admitting: Orthopaedic Surgery

## 2023-06-03 ENCOUNTER — Other Ambulatory Visit: Payer: Self-pay | Admitting: Family Medicine

## 2023-06-03 ENCOUNTER — Other Ambulatory Visit: Payer: Self-pay

## 2023-06-03 DIAGNOSIS — N3941 Urge incontinence: Secondary | ICD-10-CM

## 2023-06-03 DIAGNOSIS — A6000 Herpesviral infection of urogenital system, unspecified: Secondary | ICD-10-CM

## 2023-06-03 DIAGNOSIS — I10 Essential (primary) hypertension: Secondary | ICD-10-CM

## 2023-06-03 DIAGNOSIS — G8929 Other chronic pain: Secondary | ICD-10-CM

## 2023-06-03 DIAGNOSIS — M25561 Pain in right knee: Secondary | ICD-10-CM

## 2023-06-03 DIAGNOSIS — M25571 Pain in right ankle and joints of right foot: Secondary | ICD-10-CM

## 2023-06-03 DIAGNOSIS — M5410 Radiculopathy, site unspecified: Secondary | ICD-10-CM

## 2023-06-03 MED ORDER — OXYBUTYNIN CHLORIDE ER 5 MG PO TB24: 5.0000 mg | ORAL_TABLET | Freq: Every day | ORAL | 0 refills | Status: AC

## 2023-06-03 MED ORDER — VALACYCLOVIR HCL 500 MG PO TABS
ORAL_TABLET | ORAL | 0 refills | Status: AC
Start: 2023-06-03 — End: ?

## 2023-06-03 NOTE — Telephone Encounter (Signed)
Med refill request:valacyclovir 500 mg and oxybutynin 5 mg Last AEX: 04/29/22 Dr. Oscar La Next AEX: none scheduled Last MMG (if hormonal med) n/a Refills sent to provider for approval or denial.

## 2023-06-03 NOTE — Progress Notes (Signed)
 [facility]        Provider: Aldean Baker, MD  Patient: Tammy Larson  DOB: [birth year]  AGE: 52 y.o.  MR#:  [id number]    SUBJECTIVE:  Tammy Larson is a pleasant 52 y.o. female who presents for evaluation of right lowe

## 2023-06-04 ENCOUNTER — Encounter (INDEPENDENT_AMBULATORY_CARE_PROVIDER_SITE_OTHER): Payer: Self-pay | Admitting: Orthopaedic Surgery

## 2023-06-05 ENCOUNTER — Other Ambulatory Visit: Payer: Self-pay | Admitting: Family Medicine

## 2023-06-05 ENCOUNTER — Inpatient Hospital Stay: Payer: BC Managed Care – PPO

## 2023-06-05 ENCOUNTER — Telehealth: Payer: Self-pay | Admitting: Family Medicine

## 2023-06-05 DIAGNOSIS — I10 Essential (primary) hypertension: Secondary | ICD-10-CM

## 2023-06-05 MED ORDER — VALSARTAN-HYDROCHLOROTHIAZIDE 160-12.5 MG PO TABS
1.0000 | ORAL_TABLET | Freq: Every day | ORAL | 0 refills | Status: DC
Start: 2023-06-05 — End: 2023-07-03

## 2023-06-05 NOTE — Telephone Encounter (Signed)
Called patient, no answer. Left voice mail asking patient to give [place] a call back regarding her blood pressure medication refill and what her current blood pressures look like. Left office number as call back number.

## 2023-06-05 NOTE — Addendum Note (Signed)
 Addended by: Ponciano Ort E on: 06/05/2023 12:48 PM     Modules accepted: Orders

## 2023-06-05 NOTE — Telephone Encounter (Signed)
Ms Calligan returned call to say her blood pressure readings have been running high the last couple weeks.     130/80 - 140/80

## 2023-06-05 NOTE — Telephone Encounter (Signed)
Please contact patient - blood pressure medication refill request received, however I have noted blood pressures have been a little elevated-  what are her blood pressures like currently?   -Dr. Lyndel Pleasure

## 2023-06-05 NOTE — Telephone Encounter (Signed)
 In speaking with patient-  her Systolic blood pressures have been into the low 140's at home.    She has been generally been eating low salts - not adding any salt to her plate. No salty snacks. She has been using NSAIDs more frequently- on Mobic  BP Readi

## 2023-06-06 ENCOUNTER — Encounter: Payer: Self-pay | Admitting: Family Medicine

## 2023-06-09 ENCOUNTER — Inpatient Hospital Stay: Payer: BC Managed Care – PPO

## 2023-06-10 ENCOUNTER — Encounter: Payer: Self-pay | Admitting: Family Medicine

## 2023-06-12 ENCOUNTER — Inpatient Hospital Stay: Payer: BC Managed Care – PPO | Admitting: Rehabilitative and Restorative Service Providers"

## 2023-06-15 ENCOUNTER — Inpatient Hospital Stay: Payer: BC Managed Care – PPO | Admitting: Rehabilitative and Restorative Service Providers"

## 2023-06-18 ENCOUNTER — Ambulatory Visit: Payer: BC Managed Care – PPO | Admitting: Family Medicine

## 2023-06-18 ENCOUNTER — Encounter: Payer: Self-pay | Admitting: Family Medicine

## 2023-06-18 ENCOUNTER — Inpatient Hospital Stay: Payer: BC Managed Care – PPO | Admitting: Rehabilitative and Restorative Service Providers"

## 2023-06-18 VITALS — BP 136/80 | HR 62 | Temp 98.2°F | Wt 169.0 lb

## 2023-06-18 DIAGNOSIS — M25561 Pain in right knee: Secondary | ICD-10-CM

## 2023-06-18 DIAGNOSIS — I10 Essential (primary) hypertension: Secondary | ICD-10-CM

## 2023-06-18 DIAGNOSIS — M79641 Pain in right hand: Secondary | ICD-10-CM

## 2023-06-18 DIAGNOSIS — N951 Menopausal and female climacteric states: Secondary | ICD-10-CM

## 2023-06-18 DIAGNOSIS — M79645 Pain in left finger(s): Secondary | ICD-10-CM

## 2023-06-18 DIAGNOSIS — R7989 Other specified abnormal findings of blood chemistry: Secondary | ICD-10-CM

## 2023-06-18 LAB — ESTRADIOL: Estradiol: 86 pg/mL

## 2023-06-18 LAB — PROGESTERONE: Progesterone: <0.5 ng/mL

## 2023-06-18 LAB — T3, FREE: T3 Free: 2.77 pg/mL (ref 1.71–3.71)

## 2023-06-18 LAB — TSH: TSH: 0.46 u[IU]/mL (ref 0.35–4.94)

## 2023-06-18 LAB — TESTOSTERONE: Testosterone: 25 ng/dL (ref 14–76)

## 2023-06-18 LAB — T4, FREE: T4 Free: 0.9 ng/dL (ref 0.69–1.48)

## 2023-06-18 NOTE — Progress Notes (Signed)
 Blood Draw:  Fasting? = Yes  Location of stick: Left AC using aseptic technique.  Outcome: Patient tolerated well without any complications. Attempt # 1.    Collected & Processed:   4 Gold top SSTs (3 SSTs with serum separated into a clear plastic tubes al

## 2023-06-18 NOTE — Progress Notes (Signed)
 [facility]   OFFICE VISIT            HPI   Chief Complaint   Patient presents with    Joint Pain      HPI Tammy Larson is a 52 y.o. female who presents with:  #  Bilateral Knee pian:   R>L knee pain. Ongoing for more than 4 years intermittently,

## 2023-06-19 ENCOUNTER — Other Ambulatory Visit: Payer: Self-pay | Admitting: Family Medicine

## 2023-06-19 DIAGNOSIS — N951 Menopausal and female climacteric states: Secondary | ICD-10-CM

## 2023-06-19 LAB — FOLLICLE STIMULATING HORMONE: Follicle Stimulating Hormone: 20.16 m[IU]/mL

## 2023-06-21 ENCOUNTER — Other Ambulatory Visit: Payer: Self-pay | Admitting: Family Medicine

## 2023-06-21 DIAGNOSIS — N951 Menopausal and female climacteric states: Secondary | ICD-10-CM

## 2023-06-21 LAB — DEHYDROEPIANDROSTERONE (DHEA) SULFATE: DHEA-Sulfate: 90 ug/dL (ref 8–188)

## 2023-06-23 ENCOUNTER — Encounter: Payer: Self-pay | Admitting: Family Medicine

## 2023-06-23 NOTE — Progress Notes (Unsigned)
Please forward orders - (x ray of the hands) for patient please     -Dr. Lyndel Pleasure

## 2023-06-30 ENCOUNTER — Ambulatory Visit: Payer: BC Managed Care – PPO

## 2023-06-30 ENCOUNTER — Telehealth (INDEPENDENT_AMBULATORY_CARE_PROVIDER_SITE_OTHER): Payer: Self-pay

## 2023-06-30 ENCOUNTER — Ambulatory Visit: Payer: BC Managed Care – PPO | Attending: Orthopaedic Surgery

## 2023-06-30 DIAGNOSIS — M79645 Pain in left finger(s): Secondary | ICD-10-CM | POA: Insufficient documentation

## 2023-06-30 DIAGNOSIS — G8929 Other chronic pain: Secondary | ICD-10-CM | POA: Insufficient documentation

## 2023-06-30 DIAGNOSIS — M79641 Pain in right hand: Secondary | ICD-10-CM | POA: Insufficient documentation

## 2023-06-30 DIAGNOSIS — M5410 Radiculopathy, site unspecified: Secondary | ICD-10-CM | POA: Insufficient documentation

## 2023-06-30 DIAGNOSIS — M25561 Pain in right knee: Secondary | ICD-10-CM | POA: Insufficient documentation

## 2023-06-30 NOTE — Telephone Encounter (Signed)
Per [facility] provider portal, prior auth fro right knee MRI approved.    AUTH NO.: [id number]    VALID: 06/30/2023 ~ 08/28/2023    FACILITY: [facility]    Patient contacted w/ updated info and follow up instructions.

## 2023-07-02 ENCOUNTER — Other Ambulatory Visit: Payer: Self-pay | Admitting: Family Medicine

## 2023-07-02 ENCOUNTER — Ambulatory Visit: Payer: BC Managed Care – PPO | Admitting: Family Medicine

## 2023-07-02 DIAGNOSIS — I10 Essential (primary) hypertension: Secondary | ICD-10-CM

## 2023-07-06 ENCOUNTER — Telehealth (INDEPENDENT_AMBULATORY_CARE_PROVIDER_SITE_OTHER): Payer: BC Managed Care – PPO | Admitting: Orthopaedic Surgery

## 2023-07-06 ENCOUNTER — Encounter: Payer: Self-pay | Admitting: Family Medicine

## 2023-07-06 ENCOUNTER — Ambulatory Visit: Payer: BC Managed Care – PPO | Admitting: Family Medicine

## 2023-07-06 VITALS — BP 162/77 | HR 92 | Ht 60.0 in | Wt 158.0 lb

## 2023-07-06 DIAGNOSIS — E11319 Type 2 diabetes mellitus with unspecified diabetic retinopathy without macular edema: Secondary | ICD-10-CM | POA: Diagnosis not present

## 2023-07-06 DIAGNOSIS — Z23 Encounter for immunization: Secondary | ICD-10-CM

## 2023-07-06 DIAGNOSIS — E119 Type 2 diabetes mellitus without complications: Secondary | ICD-10-CM

## 2023-07-06 DIAGNOSIS — Z794 Long term (current) use of insulin: Secondary | ICD-10-CM

## 2023-07-06 DIAGNOSIS — F418 Other specified anxiety disorders: Secondary | ICD-10-CM | POA: Diagnosis not present

## 2023-07-06 DIAGNOSIS — F17210 Nicotine dependence, cigarettes, uncomplicated: Secondary | ICD-10-CM

## 2023-07-06 DIAGNOSIS — I10 Essential (primary) hypertension: Secondary | ICD-10-CM | POA: Diagnosis not present

## 2023-07-06 DIAGNOSIS — M25571 Pain in right ankle and joints of right foot: Secondary | ICD-10-CM

## 2023-07-06 DIAGNOSIS — M5410 Radiculopathy, site unspecified: Secondary | ICD-10-CM

## 2023-07-06 LAB — POCT GLYCOSYLATED HEMOGLOBIN (HGB A1C): HbA1c, POC (controlled diabetic range): 6.6 % (ref 0.0–7.0)

## 2023-07-06 MED ORDER — CLONAZEPAM 1 MG PO TABS: 1.0000 mg | ORAL_TABLET | Freq: Every day | ORAL | 1 refills | Status: AC | PRN

## 2023-07-06 MED ORDER — FLUOXETINE HCL 20 MG PO CAPS: 20.0000 mg | ORAL_CAPSULE | Freq: Every day | ORAL | 3 refills | Status: AC

## 2023-07-06 NOTE — Assessment & Plan Note (Signed)
Counseled on smoking cessation  

## 2023-07-06 NOTE — Assessment & Plan Note (Signed)
This has worsened some since last visit.  Recommend getting back on track with taking medications regularly and working on dietary changes.

## 2023-07-06 NOTE — Assessment & Plan Note (Addendum)
Depression accompanied by significant anxiety.  Adding fluoxetine with renewal of clonazepam.  Continue counseling.  F/u in 6 weeks.

## 2023-07-06 NOTE — Progress Notes (Signed)
[name] Hurley - 52 y.o. female MRN [id number]  Date of birth: 12/15/1970  Subjective Chief Complaint  Patient presents with   Medical Management of Chronic Issues    HPI [name] Hurley is a 52 y.o. female here today for follow up visit.   She reports that she is feeling much more stressed.   She continues on losartan, hydrochlorothiazide and amlodipine for management of HTN.  BP is elevated.  Has not been taking medications consistently due anxiety.  She does continue to smoke as well.  Denies chest pain, shortness of breath, palpitations, headache or vision changes.  Diabetes is treated with metformin, Ozempic and [place].  Doing well with these at current strength, but admits she has not been as consistent with taking medications.  A1c at 6.6% today which is increased since last visit.  She does admit that she has been under more stress recently so diet and activity have not been the same as they had been.   She has used clonazepam as needed for anxiety in the past. She has not had this   ROS:  A comprehensive ROS was completed and negative except as noted per HPI  No Known Allergies  Past Medical History:  Diagnosis Date   Diabetes mellitus    Dyspareunia    Elevated serum creatinine    Hypertension    Sciatica     Past Surgical History:  Procedure Laterality Date   CESAREAN SECTION     paragard     removal 7-16 & reinsertion 02-13-15    Social History   Socioeconomic History   Marital status: Married    Spouse name: Not on file   Number of children: 4   Years of education: 14   Highest education level: Not on file  Occupational History   Occupation: Nutritition   Tobacco Use   Smoking status: Every Day    Current packs/day: 1.00    Average packs/day: 1 pack/day for 22.0 years (22.0 ttl pk-yrs)    Types: Cigarettes   Smokeless tobacco: Never  Substance and Sexual Activity   Alcohol use: Not Currently   Drug use: No   Sexual activity: Yes    Partners: Male     Birth control/protection: I.U.D.  Other Topics Concern   Not on file  Social History Narrative   Born and raised in [place]. Fun: Sleep   Denies any religious beliefs effecting health care.    Social Determinants of Health   Financial Resource Strain: Not on file  Food Insecurity: Not on file  Transportation Needs: Not on file  Physical Activity: Not on file  Stress: Not on file  Social Connections: Not on file    Family History  Problem Relation Age of Onset   Hypertension Father    COPD Father    Emphysema Father    Diabetes Maternal Aunt    Hypertension Mother    Hyperlipidemia Mother    Colon cancer Mother    COPD Paternal Grandfather    Hypertension Brother    Heart disease Neg Hx     Health Maintenance  Topic Date Due   HIV Screening  Never done   OPHTHALMOLOGY EXAM  10/23/2022   Diabetic kidney evaluation - Urine ACR  02/04/2023   COVID-19 Vaccine (4 - 2023-24 season) 03/29/2023   INFLUENZA VACCINE  10/26/2023 (Originally 02/26/2023)   MAMMOGRAM  03/10/2024 (Originally 10/12/2021)   Fecal DNA (Cologuard)  03/10/2024 (Originally 03/08/2023)   Diabetic kidney evaluation - eGFR measurement  09/09/2023  FOOT EXAM  09/09/2023   HEMOGLOBIN A1C  09/11/2023   Lung Cancer Screening  09/18/2023   Cervical Cancer Screening (HPV/Pap Cotest)  04/11/2025   DTaP/Tdap/Td (2 - Td or Tdap) 02/19/2030   Hepatitis C Screening  Completed   Zoster Vaccines- Shingrix  Completed   HPV VACCINES  Aged Out     ----------------------------------------------------------------------------------------------------------------------------------------------------------------------------------------------------------------- Physical Exam BP (!) 162/77 (BP Location: Left Arm, Patient Position: Sitting, Cuff Size: Normal)   Pulse 92   Ht 5' (1.524 m)   Wt 158 lb (71.7 kg)   SpO2 98%   BMI 30.86 kg/m   Physical Exam Constitutional:      Appearance: Normal appearance.  Eyes:      General: No scleral icterus. Cardiovascular:     Rate and Rhythm: Normal rate and regular rhythm.  Pulmonary:     Effort: Pulmonary effort is normal.     Breath sounds: Normal breath sounds.  Neurological:     Mental Status: She is alert.  Psychiatric:        Mood and Affect: Mood normal.        Behavior: Behavior normal.     ------------------------------------------------------------------------------------------------------------------------------------------------------------------------------------------------------------------- Assessment and Plan  Type 2 diabetes mellitus without complication, with long-term current use of insulin (HCC) This has worsened some since last visit.  Recommend getting back on track with taking medications regularly and working on dietary changes.   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  Adding fluoxetine with renewal of clonazepam.  Continue counseling.  F/u in 6 weeks.   Cigarette nicotine dependence without complication Counseled on smoking cessation.   Essential hypertension Bp elevated.  Encouraged to take medication regularly.  Low sodium diet recommended.    Meds ordered this encounter  Medications   FLUoxetine (PROZAC) 20 MG capsule    Sig: Take 1 capsule (20 mg total) by mouth daily.    Dispense:  90 capsule    Refill:  3   clonazePAM (KLONOPIN) 1 MG tablet    Sig: Take 1 tablet (1 mg total) by mouth daily as needed for anxiety.    Dispense:  20 tablet    Refill:  1    Return in about 6 weeks (around 08/17/2023) for Mood/BH.    This visit occurred during the SARS-CoV-2 public health emergency.  Safety protocols were in place, including screening questions prior to the visit, additional usage of staff PPE, and extensive cleaning of exam room while observing appropriate contact time as indicated for disinfecting solutions.

## 2023-07-06 NOTE — Assessment & Plan Note (Signed)
Bp elevated.  Encouraged to take medication regularly.  Low sodium diet recommended.

## 2023-07-06 NOTE — Progress Notes (Unsigned)
[facility]        Provider: Aldean Baker, MD  Patient: Tammy Larson  DOB: October 22, 1970  AGE: 51 y.o.  MR#:  [id number]    SUBJECTIVE:  Tammy Larson is a pleasant 52 y.o. female who presents for follow up evaluation of her right leg pain.  She reports no changes since the last visit.  She had the MRI done and is here to review that today.      To review: her right lower leg pain that has been present since roughly August 2024 with no acute injury.  She saw my partner Dr. Coralee North for a related pain that she thought was coming from her knees but now the symptoms have changed and has become more of a shooting pain down the lateral and posterior aspect of her lower leg.  She denies numbness and tingling.  She describes the pain as a throbbing and similar to that of a toothache.  She only has this pain when she is sitting.  No pain with exercise or daily activity otherwise.  She does report some low back pain when she is doing dead lifts or any strenuous weight lifting at the gym.  Denies any weakness in the foot.        Medical History[1]  Past Surgical History[2]  Allergies[3]      REVIEW OF SYSTEMS:  All other systems are negative except as mentioned in the HPI    PHYSICAL EXAM: none as this was a telephone visit    IMAGING STUDIES:  MRI of the right knee was performed at [facility] radiology on 06/30/2023, these images were personally reviewed by me and I agree with the following findings:  1. No abnormality or compressive lesion identified at the posterior aspect  of the knee. The visualized portions of the posterior tibial and common  peroneal nerves are normal in appearance.     2. 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     3. Small joint effusion.     4. Edema at the superior and lateral aspect of Hoffa's fat pad with a small  area of cystic change. These findings may be seen in the setting of fat pad impingement.    X-rays of the right knee were done on 04/16/2023.  They  are personally reviewed.  They show no abnormal osseous findings    OUTSIDE RECORDS  I also had a chance to review personally records from the patient's visit with Dr. Coralee North in our electronic medical record system.  These records show she was seen on 04/16/2023    IMPRESSION: right leg radicular type pain     Diagnosis ICD-10-CM Associated Order   1. Radicular syndrome of right leg  M54.10 Ambulatory referral to Physical Therapy      2. Right ankle pain, unspecified chronicity  M25.571 Ambulatory referral to Physical Therapy            Plan:    I recommend formal physical therapy at this point to address both the knee, ankle, and low back.  I would recommend she take her Mobic regularly for the next few weeks to see if that helps.  In 6 weeks if she is still having pain, she should return to see me.    I, Saunders Revel, LAT, ATC, OTC, was acting as a Neurosurgeon for provider Aldean Baker, MD on patient Melinda Crutch, MD, personally performed the services documented. Saunders Revel, ATC acted  to assist in collecting information and scribe for the visit This note and the patient instructions accurately reflect work and decisions made by me, Aldean Baker, MD.                [1]   Past Medical History:  Diagnosis Date    Abnormal vision     eyeglasses    Anemia     Hypertension     Pain in joint     Post-operative nausea and vomiting     apfel=3   [2]   Past Surgical History:  Procedure Laterality Date    ENDOMETRIAL ABLATION      EXCISION BIOPSY WITH NEEDLE LOCALIZATION      HYSTERECTOMY  2017    HYSTEROSCOPY, MYOSURE MORCELLATION N/A 12/23/2013    Procedure: Hysteroscopic Myomectomy with Myosure;  Surgeon: Waldon Merl, MD;  Location: [facility];  Service: Gynecology;  Laterality: N/A;    LAPAROSCOPY, DIAGNOSTIC     [3] No Known Allergies

## 2023-08-01 ENCOUNTER — Other Ambulatory Visit: Payer: Self-pay | Admitting: Family Medicine

## 2023-08-03 ENCOUNTER — Other Ambulatory Visit: Payer: Self-pay | Admitting: Family Medicine

## 2023-08-07 ENCOUNTER — Institutional Professional Consult (permissible substitution): Payer: BC Managed Care – PPO | Admitting: Geriatric Medicine

## 2023-08-07 ENCOUNTER — Ambulatory Visit (INDEPENDENT_AMBULATORY_CARE_PROVIDER_SITE_OTHER): Payer: BC Managed Care – PPO | Admitting: Rheumatology

## 2023-08-07 ENCOUNTER — Encounter (INDEPENDENT_AMBULATORY_CARE_PROVIDER_SITE_OTHER): Payer: Self-pay | Admitting: Rheumatology

## 2023-08-07 VITALS — BP 149/84 | HR 80 | Ht 65.0 in | Wt 177.0 lb

## 2023-08-07 DIAGNOSIS — M79641 Pain in right hand: Secondary | ICD-10-CM

## 2023-08-07 DIAGNOSIS — M25561 Pain in right knee: Secondary | ICD-10-CM

## 2023-08-07 DIAGNOSIS — M79642 Pain in left hand: Secondary | ICD-10-CM

## 2023-08-07 DIAGNOSIS — G8929 Other chronic pain: Secondary | ICD-10-CM

## 2023-08-07 DIAGNOSIS — Z0000 Encounter for general adult medical examination without abnormal findings: Secondary | ICD-10-CM

## 2023-08-07 DIAGNOSIS — M150 Primary generalized (osteo)arthritis: Secondary | ICD-10-CM

## 2023-08-07 LAB — URINE PROTEIN/CREATININE RATIO
Urine Creatinine: 112 mg/dL
Urine Protein: <7 mg/dL (ref 1.0–14.0)

## 2023-08-07 LAB — LAB USE ONLY - CBC WITH DIFFERENTIAL
Absolute Basophils: 0.03 10*3/uL (ref 0.00–0.08)
Absolute Eosinophils: 0.08 10*3/uL (ref 0.00–0.44)
Absolute Immature Granulocytes: 0.02 10*3/uL (ref 0.00–0.07)
Absolute Lymphocytes: 1.84 10*3/uL (ref 0.42–3.22)
Absolute Monocytes: 0.31 10*3/uL (ref 0.21–0.85)
Absolute Neutrophils: 2.9 10*3/uL (ref 1.10–6.33)
Absolute nRBC: 0 10*3/uL (ref <=0.00)
Basophils %: 0.6 %
Eosinophils %: 1.5 %
Hematocrit: 38.8 % (ref 34.7–43.7)
Hemoglobin: 12.3 g/dL (ref 11.4–14.8)
Immature Granulocytes %: 0.4 %
Lymphocytes %: 35.5 %
MCH: 31.6 pg (ref 25.1–33.5)
MCHC: 31.7 g/dL (ref 31.5–35.8)
MCV: 99.7 fL — ABNORMAL HIGH (ref 78.0–96.0)
MPV: 10.2 fL (ref 8.9–12.5)
Monocytes %: 6 %
Neutrophils %: 56 %
Platelet Count: 324 10*3/uL (ref 142–346)
Preliminary Absolute Neutrophil Count: 2.9 10*3/uL (ref 1.10–6.33)
RBC: 3.89 10*6/uL — ABNORMAL LOW (ref 3.90–5.10)
RDW: 13 % (ref 11–15)
WBC: 5.18 10*3/uL (ref 3.10–9.50)
nRBC %: 0 /100{WBCs} (ref <=0.0)

## 2023-08-07 LAB — COMPREHENSIVE METABOLIC PANEL
ALT: 19 U/L (ref <=55)
AST (SGOT): 24 U/L (ref <=41)
Albumin/Globulin Ratio: 1.3 (ref 0.9–2.2)
Albumin: 4.2 g/dL (ref 3.5–5.0)
Alkaline Phosphatase: 53 U/L (ref 37–117)
Anion Gap: 7 (ref 5.0–15.0)
BUN: 10 mg/dL (ref 7–21)
Bilirubin, Total: 0.2 mg/dL (ref 0.2–1.2)
CO2: 28 meq/L (ref 17–29)
Calcium: 9 mg/dL (ref 8.5–10.5)
Chloride: 104 meq/L (ref 99–111)
Creatinine: 0.7 mg/dL (ref 0.4–1.0)
GFR: >60 mL/min/{1.73_m2} (ref >=60.0)
Globulin: 3.2 g/dL (ref 2.0–3.6)
Glucose: 95 mg/dL (ref 70–100)
Hemolysis Index: 2 {index}
Potassium: 3.9 meq/L (ref 3.5–5.3)
Protein, Total: 7.4 g/dL (ref 6.0–8.3)
Sodium: 139 meq/L (ref 135–145)

## 2023-08-07 LAB — URINALYSIS WITH MICROSCOPIC EXAM
Urine Bilirubin: NEGATIVE
Urine Blood: NEGATIVE
Urine Glucose: NEGATIVE
Urine Ketones: NEGATIVE mg/dL
Urine Leukocyte Esterase: NEGATIVE
Urine Nitrite: NEGATIVE
Urine Protein: NEGATIVE
Urine Specific Gravity: 1.021 (ref 1.001–1.035)
Urine Urobilinogen: NORMAL mg/dL (ref 0.2–2.0)
Urine pH: 7 (ref 5.0–8.0)

## 2023-08-07 LAB — RHEUMATOID FACTOR: Rheumatoid Factor: <13 [IU]/mL (ref <30.0)

## 2023-08-07 LAB — C4 COMPLEMENT: C4 Complement: 35 mg/dL (ref 15.0–57.0)

## 2023-08-07 LAB — C3 COMPLEMENT: C3 Complement: 131 mg/dL (ref 83–193)

## 2023-08-07 LAB — SEDIMENTATION RATE: Sed Rate: 28 mm/h — ABNORMAL HIGH (ref <=20)

## 2023-08-07 LAB — C-REACTIVE PROTEIN: C-Reactive Protein: 0.5 mg/dL (ref 0.0–1.1)

## 2023-08-07 NOTE — Patient Instructions (Addendum)
 Dear Tammy Larson ,       Here are things I'd like you to do following today's visit:  Please have blood taken for labs.      We will discuss your results at your next appointment. Please set up that appointment on your way out. If there are any extraordinary abnormal results, you will hear from me or our staff here in the office.    Sincerely,    Dolores Manzanilla, MD  [facility]

## 2023-08-07 NOTE — Progress Notes (Signed)
 Chief Complaint   Patient presents with    Consult (Initial)    Joint Pain     C/o joint pain of knee (primarily Rt knee), Rt ankle, and Bilateral hands. Pt has noticed swelling of 1st finger of Lt hand and swelling around knuckle joints of Rt hand. XR Rt hand and XR Lt fingers done on 12/3 & MRI Rt knee done on 12/3  Sx onset: over last year. Meloxicam  15mg  PRN        HPI   Tammy Larson is a 53 y.o. female with hypertension who presents to the rheumatology clinic for evaluation of joint pain.      Patient presents with right knee pain that has been ongoing the past one year.  Pain initially occurred intermittently but recently got more severe and frequent.  The right knee pain radiates to the ankles sometimes. It is aching and sometimes burning in nature.  She started doing PT for knee pain but only had 1 session. She wanted to identify the cause for knee pain before continuing PT.    She has been having pain over the bilaterla index finger for the past year. Pain is worse in the morning.  The pain is at base index finger right on the and Left index PIP.  She feels bony swelling over the left PIP.    She also gets right ankle pain which started around September. no joint swelling.    Denies morning stiffness, numbness and tingling of extremities    She was given meloxiam but did not help.She has been using volterain for hand.Tylenol  helps at night.     Denies chronic skin condition, ulcer in the mouth, no blood in the stool or urine , no raynuds,       no hx of blood clot.  She had one miscarriage.   no family hx of RA or SLE.            Active Ambulatory Problems     Diagnosis Date Noted    Uterine leiomyoma 12/23/2013    Primary hypertension 08/22/2021    Perimenopausal vasomotor symptoms 08/22/2021    Lesion of breast 03/24/2023     Resolved Ambulatory Problems     Diagnosis Date Noted    Acute pain of right knee 05/04/2023    Acute pain of left knee 05/04/2023    Muscle weakness (generalized) 05/04/2023      Past Medical History:   Diagnosis Date    Abnormal vision     Anemia     Hypertension     Low back pain     Osteoarthritis     Pain in joint     Post-operative nausea and vomiting     Sciatica     Seasonal allergic rhinitis         Past Surgical History[1]     Family History[2]    Social History[3]     Medications Ordered Prior to Encounter[4]     REVIEW OF SYSTEMS   All other systems reviewed and are negative except as per HPI      PHYSICAL EXAM  Vitals:    08/07/23 0920   BP: 149/84   Pulse: 80      General: Awake and alert  Eyes: Anicteric sclera, no photophobia  ENT: Face symmetric without lesions, no oral ulcers and moist oral mucosa  CV: Regular, s1 and s2 well heard without murmurs  Resp: Clear to auscultation, no wheezes or crackles   GI: Non-distended,  soft and non-tender  Skin: No rash,alopecia ,sclerodactyly or telangectasia  Ext: No edema  Neuro: Alert and no gross focal deficit  MSK:   Neck-normal flexion, extension, and rotation of cervical spine  Left Hand-normal ROM, no warmth, bony enlargement over index finger PIP.  Right Hand- normal ROM, no warmth  Wrist-normal ROM, no warmth bilaterally  Elbows-normal ROM, no warmth bilaterally   Shoulders-normal flexion/extension, internal and external ROM bilaterally  Knees-normal ROM, no warmth, no crepitus, no effusion   Ankles-normal flexion and extension, no synovitis  Feet-no MTP tenderness, warmth, swelling                LABS:  Lab Results   Component Value Date    WBC 5.67 09/01/2022    HGB 12.7 09/01/2022    HCT 36.9 09/01/2022    MCV 93.7 09/01/2022    PLT 294 09/01/2022      Lab Results   Component Value Date    CREAT 0.8 09/01/2022    BUN 9.0 09/01/2022    NA 139 09/01/2022    K 3.6 09/01/2022    CL 103 09/01/2022    CO2 30 (H) 09/01/2022      Lab Results   Component Value Date    ALT 13 09/01/2022    AST 17 09/01/2022    ALKPHOS 49 09/01/2022    BILITOTAL 0.6 09/01/2022      No results found for: ESR   No results found for: CRP   No  results found for: ANA   No results found for: ANA, RHEUMFACTOR, ESR     RADIOLOGY:  I have reviewed the patients latest radiology results    Reviewed labs  Unremarkable CBC and BMP-2024    ASSESSMENT & PLAN:  Tammy Larson is a 53 y.o. female with hypertension who presents to the rheumatology clinic for evaluation of joint pain.      Patient presents with pain involving her right knee, right ankle and hands.Pain does not sound inflammatory in nature  Hand x-rays showing mild OA changes.  MRI of the right knee showing mild to moderate OA with small joint effusion.  Features suggestive of fat pad impingement..    -Will work patient for inflammatory arthritis  -Advise to use topical cream for pain relief    1. Pain in both hands  2. Chronic pain of right knee  3. Primary osteoarthritis involving multiple joints  - CBC with Differential (Order); Future  - Comprehensive Metabolic Panel; Future  - Sedimentation Rate (ESR); Future  - C Reactive Protein; Future  - Rheumatoid Factor; Future  - Cyclic Citrullinated Peptide (CCP) Antibody, IgG; Future  - ANA Screen, IFA with Reflex to Titer and Pattern; Future  - C3 Complement; Future  - C4 Complement; Future  - Urine Protein/Creatinine Ratio; Future  - Urinalysis with Microscopic Exam; Future  - CBC with Differential (Order)  - Comprehensive Metabolic Panel  - Sedimentation Rate (ESR)  - C Reactive Protein  - Rheumatoid Factor  - Cyclic Citrullinated Peptide (CCP) Antibody, IgG  - ANA Screen, IFA with Reflex to Titer and Pattern  - C3 Complement  - C4 Complement  - Urine Protein/Creatinine Ratio  - Urinalysis with Microscopic Exam                         PATIENT INSTRUCTIONS:  Patient Instructions   Dear Tammy Larson ,       Here are things I'd like you to  do following today's visit:  Please have blood taken for labs.      We will discuss your results at your next appointment. Please set up that appointment on your way out. If there are any extraordinary abnormal  results, you will hear from me or our staff here in the office.    Sincerely,    Dolores Manzanilla, MD  [facility]         Dolores Manzanilla MD  Rheumatologist  [address]  [place], [place] [address]   [phone]        This note was generated within the [employer] EMR using Dragon medical speech recognition software and may contain inherent errors or omissions not intended by the user. Grammatical and punctuation errors, random word insertions, deletions, pronoun errors and incomplete sentences are occasional consequences of this technology due to software limitations. Not all errors are caught or corrected.  Although every attempt is made to root out erroneus and incomplete transcription, the note may still not fully represent the intent or opinion of the author. If there are questions or concerns about the content of this note or information contained within the body of this dictation they should be addressed directly with the author for clarification.               [1]   Past Surgical History:  Procedure Laterality Date    ENDOMETRIAL ABLATION      EXCISION BIOPSY WITH NEEDLE LOCALIZATION      HYSTERECTOMY  2017    HYSTEROSCOPY, MYOSURE MORCELLATION N/A 12/23/2013    Procedure: Hysteroscopic Myomectomy with Myosure;  Surgeon: Kermit Sharyne HERO, MD;  Location: [facility];  Service: Gynecology;  Laterality: N/A;    LAPAROSCOPY, DIAGNOSTIC     [2]   Family History  Problem Relation Name Age of Onset    Hypertension Mother Mom     Parkinsonism Father      Hypertension Brother Lynwood     Cancer Paternal Aunt Aunt Beck         Colon Cancer    Diabetes Paternal Uncle Uncle Charlie     Diabetes Paternal Higher Education Careers Adviser    [3]   Social History  Socioeconomic History    Marital status: Married   Tobacco Use    Smoking status: Never     Passive exposure: Never    Smokeless tobacco: Never   Vaping Use    Vaping status: Never Used   Substance and Sexual Activity    Alcohol use: Yes     Alcohol/week: 2.0  standard drinks of alcohol     Types: 1 Glasses of wine, 1 Drinks containing 0.5 oz of alcohol per week     Comment: socially/varies    Drug use: No    Sexual activity: Yes     Partners: Female     Birth control/protection: None     Social Drivers of Psychologist, Prison And Probation Services Strain: Low Risk  (03/23/2023)    Overall Financial Resource Strain (CARDIA)     Difficulty of Paying Living Expenses: Not hard at all   Food Insecurity: No Food Insecurity (03/23/2023)    Hunger Vital Sign     Worried About Running Out of Food in the Last Year: Never true     Ran Out of Food in the Last Year: Never true   Transportation Needs: No Transportation Needs (03/23/2023)    PRAPARE - Transportation     Lack of Transportation (  Medical): No     Lack of Transportation (Non-Medical): No   Physical Activity: Sufficiently Active (03/23/2023)    Exercise Vital Sign     Days of Exercise per Week: 5 days     Minutes of Exercise per Session: 60 min   Stress: No Stress Concern Present (03/23/2023)    [employer] of Occupational Health - Occupational Stress Questionnaire     Feeling of Stress : Not at all   Social Connections: Socially Integrated (03/23/2023)    Social Connection and Isolation Panel [NHANES]     Frequency of Communication with Friends and Family: More than three times a week     Frequency of Social Gatherings with Friends and Family: Once a week     Attends Religious Services: 1 to 4 times per year     Active Member of [employer] or Organizations: Yes     Attends Engineer, Structural: More than 4 times per year     Marital Status: Married   Catering Manager Violence: Not At Risk (03/23/2023)    Humiliation, Afraid, Rape, and Kick questionnaire     Fear of Current or Ex-Partner: No     Emotionally Abused: No     Physically Abused: No     Sexually Abused: No   Housing Stability: Low Risk  (03/23/2023)    Housing Stability Vital Sign     Unable to Pay for Housing in the Last Year: No     Number of Times Moved in the Last Year:  0     Homeless in the Last Year: No   [4]   Current Outpatient Medications on File Prior to Visit   Medication Sig Dispense Refill    Biotin w/ Vitamins C & E (HAIR/SKIN/NAILS PO) Take 1 tablet by mouth daily      Cholecalciferol (D3 PO) Take 5,000 IU by mouth daily      estradiol  (CLIMARA ) 0.05 MG/24HR APPLY 1 PATCH ONCE A WEEK 4 patch 1    fluticasone  (FLONASE ) 50 MCG/ACT nasal spray SPRAY 1 SPRAY BY NASAL ROUTE AS NEEDED FOR ALLERGIES 16 mL 3    meloxicam  (MOBIC ) 15 MG tablet Take 1 tablet (15 mg) by mouth daily as needed for Pain 90 tablet 0    Multiple Vitamin (MULTIVITAMIN ADULT PO) Take by mouth      Probiotic Product (PROBIOTIC PO) Take 1 tablet by mouth daily      valsartan -hydroCHLOROthiazide  (DIOVAN -HCT) 160-12.5 MG per tablet TAKE 1 TABLET BY MOUTH EVERY DAY 90 tablet 0    CALCIUM-MAGNESIUM-ZINC PO Take 1 tablet by mouth daily (Patient not taking: Reported on 08/07/2023)       No current facility-administered medications on file prior to visit.

## 2023-08-09 LAB — CYCLIC CITRULLINATED PEPTIDE (CCP) ANTIBODY, IGG: Cyclic Citrullinated Peptide (CCP) Antibody, IgG: <16 U (ref <20)

## 2023-08-10 ENCOUNTER — Telehealth: Payer: Self-pay | Admitting: Family Medicine

## 2023-08-10 ENCOUNTER — Encounter: Payer: Self-pay | Admitting: Family Medicine

## 2023-08-10 DIAGNOSIS — Z0000 Encounter for general adult medical examination without abnormal findings: Secondary | ICD-10-CM

## 2023-08-10 LAB — LIPID PANEL
Cholesterol / HDL Ratio: 4.2 {index}
Cholesterol: 239 mg/dL — ABNORMAL HIGH (ref <=199)
HDL: 57 mg/dL
LDL Calculated: 162 mg/dL — ABNORMAL HIGH (ref 0–99)
Triglycerides: 101 mg/dL
VLDL Calculated: 20 mg/dL (ref 10–40)

## 2023-08-10 LAB — ANA SCREEN, IFA WITH REFLEX TO TITER AND PATTERN: ANA Screen: NEGATIVE

## 2023-08-10 NOTE — Telephone Encounter (Signed)
 Spoke to lab, lipid panel added

## 2023-08-10 NOTE — Telephone Encounter (Signed)
 Patient had recent labs done at [facility]-  can we please call to have a LIPID panel added please. Order placed      -Dr. Lyndel Pleasure

## 2023-08-25 ENCOUNTER — Encounter (INDEPENDENT_AMBULATORY_CARE_PROVIDER_SITE_OTHER): Payer: Self-pay | Admitting: Rheumatology

## 2023-08-25 ENCOUNTER — Telehealth (INDEPENDENT_AMBULATORY_CARE_PROVIDER_SITE_OTHER): Payer: BC Managed Care – PPO | Admitting: Rheumatology

## 2023-08-25 ENCOUNTER — Other Ambulatory Visit (INDEPENDENT_AMBULATORY_CARE_PROVIDER_SITE_OTHER): Payer: Self-pay | Admitting: Family Medicine

## 2023-08-25 DIAGNOSIS — M79642 Pain in left hand: Secondary | ICD-10-CM

## 2023-08-25 DIAGNOSIS — M79641 Pain in right hand: Secondary | ICD-10-CM

## 2023-08-25 DIAGNOSIS — G8929 Other chronic pain: Secondary | ICD-10-CM

## 2023-08-25 DIAGNOSIS — M25561 Pain in right knee: Secondary | ICD-10-CM

## 2023-08-25 DIAGNOSIS — M150 Primary generalized (osteo)arthritis: Secondary | ICD-10-CM

## 2023-08-25 NOTE — Progress Notes (Signed)
 RHEUMATOLOGY FOLLOW UP TELEMEDICINE NOTE    Telemedicine Documentation Requirements  Originating site (Patient location): home  Distant site (Provider location): Office  Type of Visit: Video Visit  Provider and Title: Dr. Cheyenne  Verbal Consent has been obtained to conduct a telemedicine visit on this date in order to minimize exposure to COVID-19.  Language, if applicable and if translator was required: English     PCP: Nanine Lavern BRAVO, MD    HPI:     Tammy Larson is a 53 y.o. female who presents for follow up telemedicine evaluation of joint pain.    Patient seen as a new patient on 01/10.  Patient presents with right knee pain that has been ongoing the past one year.  Pain initially occurred intermittently but recently got more severe and frequent.  The right knee pain radiates to the ankles sometimes. It is aching and sometimes burning in nature.  She started doing PT for knee pain but only had 1 session. She wanted to identify the cause for knee pain before continuing PT.     She has been having pain over the bilaterla index finger for the past year. Pain is worse in the morning.  The pain is at base index finger right on the and Left index PIP.  She feels bony swelling over the left PIP.     She also gets right ankle pain which started around September. no joint swelling.     Denies morning stiffness, numbness and tingling of extremities        Interval History:    She continues to have pain over her right knee and hands.  She has not resumed PT.    Denies any joint swelling.   The following sections were reviewed this encounter by the provider:        PMH/PSH:  Medical History[1]     Past Surgical History[2]     FH/SH:  Family History[3]    Social History[4]     Meds/ Allergies:  Medications Taking[5]  Allergies[6]       PHYSICAL EXAM  General- WNWD, alert and oriented, NAD  Eyes- no conjunctival injection, no scleral icterus  ENMT- face symmetric without lesions  Skin- no rash, no alopecia  Pulm:  No conversational dyspnea, breathing comfortable on room air  Neuro - no notable neurological deficits, no facial asymmetry        LABS:  Lab Results   Component Value Date    WBC 5.18 08/07/2023    HGB 12.3 08/07/2023    HCT 38.8 08/07/2023    MCV 99.7 (H) 08/07/2023    PLT 324 08/07/2023      Lab Results   Component Value Date    CREAT 0.7 08/07/2023    BUN 10 08/07/2023    NA 139 08/07/2023    K 3.9 08/07/2023    CL 104 08/07/2023    CO2 28 08/07/2023      Lab Results   Component Value Date    ALT 19 08/07/2023    AST 24 08/07/2023    ALKPHOS 53 08/07/2023    BILITOTAL 0.2 08/07/2023     Lab Results   Component Value Date    ESR 28 (H) 08/07/2023      Lab Results   Component Value Date    CRP 0.5 08/07/2023      No results found for: ANA   Lab Results   Component Value Date    RHEUMFACTOR <13.0 08/07/2023  Lab Results   Component Value Date    CCP <16 08/07/2023     No results found for: URICACID        ASSESSMENT/PLAN:    Tammy Larson is a 53 y.o. female with hypertension who presents to the rheumatology clinic for joint pain follow-up.        Patient presents with pain involving her right knee, right ankle and hands.  Hand x-rays showing mild OA changes.  MRI of the right knee showing mild to moderate OA with small joint effusion.  Features suggestive of fat pad impingement..  Workup showing RF and CCP negative.  ANA negative.  Mildly elevated ESR with normal CRP.  Overall presentation not consistent with inflammatory arthritis.  Discussed finding with patient and answered question to her level of satisfaction.  Instructed patient to do physical therapy and use topical creams for pain.    1. Pain in both hands    2. Chronic pain of right knee    3. Primary osteoarthritis involving multiple joints       FOLLOW UP:  No follow-ups on file.                                                                                  Dolores Manzanilla, MD  Rheumatologist  [facility]  [address]  [place], [place] [address]  Phone: [phone]  Fax: [phone]                   [1]   Past Medical History:  Diagnosis Date    Abnormal vision     eyeglasses    Anemia     Hypertension     Low back pain     Osteoarthritis     Pain in joint     Post-operative nausea and vomiting     apfel=3    Sciatica     Seasonal allergic rhinitis    [2]   Past Surgical History:  Procedure Laterality Date    ENDOMETRIAL ABLATION      EXCISION BIOPSY WITH NEEDLE LOCALIZATION      HYSTERECTOMY  2017    HYSTEROSCOPY, MYOSURE MORCELLATION N/A 12/23/2013    Procedure: Hysteroscopic Myomectomy with Myosure;  Surgeon: Kermit Sharyne HERO, MD;  Location: [facility];  Service: Gynecology;  Laterality: N/A;    LAPAROSCOPY, DIAGNOSTIC     [3]   Family History  Problem Relation Name Age of Onset    Hypertension Mother Mom     Parkinsonism Father      Hypertension Brother Lynwood     Cancer Paternal Aunt Aunt Beck         Colon Cancer    Diabetes Paternal Uncle Uncle Charlie     Diabetes Paternal Jeannine Bunk    [4]   Social History  Tobacco Use    Smoking status: Never     Passive exposure: Never    Smokeless tobacco: Never   Vaping Use    Vaping status: Never Used   Substance Use Topics    Alcohol use: Yes     Alcohol/week: 2.0 standard drinks of alcohol     Types: 1  Glasses of wine, 1 Drinks containing 0.5 oz of alcohol per week     Comment: socially/varies    Drug use: No   [5]   No outpatient medications have been marked as taking for the 08/25/23 encounter (Telemedicine Visit) with Adelheid Hoggard B, MD.   [6] No Known Allergies

## 2023-08-28 ENCOUNTER — Other Ambulatory Visit: Payer: Self-pay | Admitting: Family Medicine

## 2023-08-28 DIAGNOSIS — N951 Menopausal and female climacteric states: Secondary | ICD-10-CM

## 2023-08-31 ENCOUNTER — Ambulatory Visit (INDEPENDENT_AMBULATORY_CARE_PROVIDER_SITE_OTHER): Payer: BC Managed Care – PPO | Admitting: Orthopaedic Surgery

## 2023-09-02 NOTE — Progress Notes (Signed)
Please assist patient    Thanks  -Dr. Lyndel Pleasure

## 2023-09-04 ENCOUNTER — Encounter: Payer: BC Managed Care – PPO | Admitting: Family Medicine

## 2023-09-14 ENCOUNTER — Ambulatory Visit (INDEPENDENT_AMBULATORY_CARE_PROVIDER_SITE_OTHER): Payer: 59 | Admitting: Family Medicine

## 2023-09-14 ENCOUNTER — Encounter: Payer: Self-pay | Admitting: Family Medicine

## 2023-09-14 DIAGNOSIS — F418 Other specified anxiety disorders: Secondary | ICD-10-CM

## 2023-09-14 DIAGNOSIS — I10 Essential (primary) hypertension: Secondary | ICD-10-CM

## 2023-09-14 DIAGNOSIS — Z114 Encounter for screening for human immunodeficiency virus [HIV]: Secondary | ICD-10-CM

## 2023-09-14 DIAGNOSIS — E11319 Type 2 diabetes mellitus with unspecified diabetic retinopathy without macular edema: Secondary | ICD-10-CM

## 2023-09-14 DIAGNOSIS — Z794 Long term (current) use of insulin: Secondary | ICD-10-CM

## 2023-09-14 DIAGNOSIS — F17210 Nicotine dependence, cigarettes, uncomplicated: Secondary | ICD-10-CM

## 2023-09-14 DIAGNOSIS — E785 Hyperlipidemia, unspecified: Secondary | ICD-10-CM

## 2023-09-14 LAB — POCT GLYCOSYLATED HEMOGLOBIN (HGB A1C): HbA1c, POC (controlled diabetic range): 6.7 % (ref 0.0–7.0)

## 2023-09-14 NOTE — Progress Notes (Unsigned)
Pt states 2ng & 3rd toes of right foot have started tohurt after several years of being told to hold on surgery by Podiatry.

## 2023-09-15 NOTE — Assessment & Plan Note (Signed)
Her blood pressure remains elevated.  She will continue current medications I have placed a referral to the advanced hypertension clinic.  I encouraged her to stop smoking and follow a low-sodium diet.

## 2023-09-15 NOTE — Progress Notes (Signed)
[name] Hurley - 53 y.o. female MRN [id number]  Date of birth: 05/21/1971  Subjective Chief Complaint  Patient presents with   Diabetes   Hypertension    HPI Birttany Hurley is a 53 year old female here today for follow-up visit.  She reports she is doing okay.  Her blood pressure remains elevated.  She is taking medications as directed.  She is currently on 3 separate medications and tolerating this well at current strength.  She does continue to smoke pretty heavily.  She has not had any symptoms related to hypertension including chest pain, shortness of breath, palpitations, headaches or vision changes.  Diabetes is fairly well-controlled.  Doing well with [place] and Ozempic as well as metformin at current strength.  No side effects with current medications.  She denies hypoglycemia.  She continues to deal with quite a bit of anxiety with some depression.  She does feel the fluoxetine continues work pretty well for her.  Using hydroxyzine as needed.  Rare use of clonazepam.  She does continue to see a therapist.  She does have some issues with hammertoe.  She has seen podiatry in the past and they recommended surgery which she had put off.  ROS:  A comprehensive ROS was completed and negative except as noted per HPI  No Known Allergies  Past Medical History:  Diagnosis Date   Diabetes mellitus    Dyspareunia    Elevated serum creatinine    Hypertension    Sciatica     Past Surgical History:  Procedure Laterality Date   CESAREAN SECTION     paragard     removal 7-16 & reinsertion 02-13-15    Social History   Socioeconomic History   Marital status: Married    Spouse name: Not on file   Number of children: 4   Years of education: 14   Highest education level: Not on file  Occupational History   Occupation: Nutritition   Tobacco Use   Smoking status: Every Day    Current packs/day: 1.00    Average packs/day: 1 pack/day for 22.0 years (22.0 ttl pk-yrs)    Types:  Cigarettes   Smokeless tobacco: Never  Substance and Sexual Activity   Alcohol use: Not Currently   Drug use: No   Sexual activity: Yes    Partners: Male    Birth control/protection: I.U.D.  Other Topics Concern   Not on file  Social History Narrative   Born and raised in [place]. Fun: Sleep   Denies any religious beliefs effecting health care.    Social Drivers of Corporate investment banker Strain: Not on file  Food Insecurity: Not on file  Transportation Needs: Not on file  Physical Activity: Not on file  Stress: Not on file  Social Connections: Not on file    Family History  Problem Relation Age of Onset   Hypertension Father    COPD Father    Emphysema Father    Diabetes Maternal Aunt    Hypertension Mother    Hyperlipidemia Mother    Colon cancer Mother    COPD Paternal Grandfather    Hypertension Brother    Heart disease Neg Hx     Health Maintenance  Topic Date Due   HIV Screening  Never done   OPHTHALMOLOGY EXAM  10/23/2022   Diabetic kidney evaluation - Urine ACR  02/04/2023   Diabetic kidney evaluation - eGFR measurement  09/09/2023   Lung Cancer Screening  09/18/2023   MAMMOGRAM  03/10/2024 (Originally 10/12/2021)  Fecal DNA (Cologuard)  03/10/2024 (Originally 03/08/2023)   HEMOGLOBIN A1C  03/13/2024   FOOT EXAM  09/13/2024   Cervical Cancer Screening (HPV/Pap Cotest)  04/11/2025   DTaP/Tdap/Td (2 - Td or Tdap) 02/19/2030   Pneumococcal Vaccine 30-8 Years old  Completed   INFLUENZA VACCINE  Completed   COVID-19 Vaccine  Completed   Hepatitis C Screening  Completed   Zoster Vaccines- Shingrix  Completed   HPV VACCINES  Aged Out     ----------------------------------------------------------------------------------------------------------------------------------------------------------------------------------------------------------------- Physical Exam There were no vitals taken for this visit.  Physical Exam Constitutional:      Appearance:  Normal appearance.  HENT:     Head: Normocephalic and atraumatic.  Cardiovascular:     Rate and Rhythm: Normal rate and regular rhythm.  Pulmonary:     Effort: Pulmonary effort is normal.     Breath sounds: Normal breath sounds.  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 Bunion noted as well.  Neurological:     Mental Status: She is alert.     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  Recommend continuation of current medications.  Essential hypertension Her blood pressure remains elevated.  She will continue current medications I have placed a referral to the advanced hypertension clinic.  I encouraged her to stop smoking and follow a low-sodium diet.  Cigarette nicotine dependence without complication Smoking cessation encouraged.  Dyslipidemia Continue pravastatin at current strength.  Anxiety with depression I encouraged her to continue to see her therapist.  Continue fluoxetine at current strength.   No orders of the defined types were placed in this encounter.   Return in about 4 months (around 01/12/2024) for Type 2 Diabetes, Hypertension.    This visit occurred during the SARS-CoV-2 public health emergency.  Safety protocols were in place, including screening questions prior to the visit, additional usage of staff PPE, and extensive cleaning of exam room while observing appropriate contact time as indicated for disinfecting solutions.

## 2023-09-15 NOTE — Assessment & Plan Note (Signed)
I encouraged her to continue to see her therapist.  Continue fluoxetine at current strength.

## 2023-09-15 NOTE — Assessment & Plan Note (Signed)
Continue pravastatin at current strength. 

## 2023-09-15 NOTE — Assessment & Plan Note (Signed)
 Smoking cessation encouraged!

## 2023-09-15 NOTE — Assessment & Plan Note (Signed)
Diabetes is fairly well-controlled.  Recommend continuation of current medications.

## 2023-09-21 ENCOUNTER — Ambulatory Visit (INDEPENDENT_AMBULATORY_CARE_PROVIDER_SITE_OTHER): Payer: BC Managed Care – PPO | Admitting: Orthopaedic Surgery

## 2023-09-22 ENCOUNTER — Ambulatory Visit: Payer: 59

## 2023-09-22 DIAGNOSIS — J439 Emphysema, unspecified: Secondary | ICD-10-CM

## 2023-09-22 DIAGNOSIS — Z122 Encounter for screening for malignant neoplasm of respiratory organs: Secondary | ICD-10-CM

## 2023-09-22 DIAGNOSIS — F17210 Nicotine dependence, cigarettes, uncomplicated: Secondary | ICD-10-CM

## 2023-09-22 DIAGNOSIS — I2510 Atherosclerotic heart disease of native coronary artery without angina pectoris: Secondary | ICD-10-CM

## 2023-09-25 ENCOUNTER — Other Ambulatory Visit: Payer: Self-pay | Admitting: Family Medicine

## 2023-09-25 DIAGNOSIS — I10 Essential (primary) hypertension: Secondary | ICD-10-CM

## 2023-10-01 ENCOUNTER — Other Ambulatory Visit: Payer: Self-pay | Admitting: Family Medicine

## 2023-10-01 ENCOUNTER — Other Ambulatory Visit: Payer: Self-pay | Admitting: Radiology

## 2023-10-01 DIAGNOSIS — A6000 Herpesviral infection of urogenital system, unspecified: Secondary | ICD-10-CM

## 2023-10-01 NOTE — Telephone Encounter (Signed)
 Med refill request: Valtrex Last AEX: 04/29/22, no show 05/04/23 with EB Next AEX: not scheduled Last MMG (if hormonal med) 10/13/19 Refill authorized: Please Advise, #[phone]

## 2023-10-01 NOTE — Telephone Encounter (Signed)
 Med refill request: valacyclovir 500 mg Last AEX: 04/29/22 Next AEX: none scheduled Last MMG (if hormonal med) 10/13/19 BI-RADS 1 negative Refill denied, needs appointment.  Sent to provider for review.

## 2023-10-12 ENCOUNTER — Encounter (INDEPENDENT_AMBULATORY_CARE_PROVIDER_SITE_OTHER): Payer: Self-pay | Admitting: Orthopaedic Surgery

## 2023-10-12 ENCOUNTER — Ambulatory Visit (INDEPENDENT_AMBULATORY_CARE_PROVIDER_SITE_OTHER)

## 2023-10-12 ENCOUNTER — Ambulatory Visit (INDEPENDENT_AMBULATORY_CARE_PROVIDER_SITE_OTHER): Admitting: Orthopaedic Surgery

## 2023-10-12 VITALS — BP 135/84 | HR 85

## 2023-10-12 DIAGNOSIS — M25571 Pain in right ankle and joints of right foot: Secondary | ICD-10-CM

## 2023-10-12 DIAGNOSIS — G609 Hereditary and idiopathic neuropathy, unspecified: Secondary | ICD-10-CM

## 2023-10-12 NOTE — Progress Notes (Signed)
 [facility]        Provider: Charmaine Medley, MD  Patient: Tammy Larson  DOB: July 03, 1971  AGE: 53 y.o.  MR#:  [id number]    SUBJECTIVE:  Tammy Larson is a pleasant 53 y.o. female who presents for follow up evaluation of her right leg pain.  She reports the discomfort continues to be only when she is seated, but now is focused mostly along the ankle and occasionally in the calf.  Her knee no longer bothers her much.  She has been taking Mobic  but was unable to start physical therapy  She saw a rheumatology who does not think this is inflammatory    To review: her right lower leg pain that has been present since roughly August 2024 with no acute injury.  She saw my partner Dr. Berkley for a related pain that she thought was coming from her knees but now the symptoms have changed and has become more of a shooting pain down the lateral and posterior aspect of her lower leg.  She denies numbness and tingling.  She describes the pain as a throbbing and similar to that of a toothache.  She only has this pain when she is sitting.  No pain with exercise or daily activity otherwise.  She does report some low back pain when she is doing dead lifts or any strenuous weight lifting at the gym.  Denies any weakness in the foot.        Medical History[1]  Past Surgical History[2]  Allergies[3]      REVIEW OF SYSTEMS:  All other systems are negative except as mentioned in the HPI    PHYSICAL EXAM:     Skin: intact, no open wounds    Swelling: none    Ecchymosis: absent    Hindfoot Alignment: neutral    Foot Alignment: neutral    Range of Motion:        Ankle joint dorsiflexion w/knee in extension: 5 deg        Ankle joint dorsiflexion w/knee in flexion: 10 deg        Ankle joint plantarflexion with knee: 45 deg   ROM with pain: no          Subtalar joint eversion: 5 deg        Subtalar joint inversion: 10 deg              ROM with pain: no    Tenderness: None    Stability Exams:        Anterior  drawer: Negative        Talar tilt: Negative        Squeeze Test: Negative        External Rotation Stress: Negative        Transverse tarsal joint stress: Negative          Special Tests:        Single leg heel rise: Negative        Hindfoot corrects with heel rise: N/A        Too-many toes: Negative                  IMAGING STUDIES:  MRI of the right knee was performed at [facility] radiology on 06/30/2023, these images were personally reviewed by me and I agree with the following findings:  1. No abnormality or compressive lesion identified at the posterior aspect  of the knee. The visualized portions of the posterior tibial  and common  peroneal nerves are normal in appearance.     2. 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     3. Small joint effusion.     4. Edema at the superior and lateral aspect of Hoffa's fat pad with a small  area of cystic change. These findings may be seen in the setting of fat pad impingement.    X-rays of the right knee were done on 04/16/2023.  They are personally reviewed.  They show no abnormal osseous findings    OUTSIDE RECORDS  I also had a chance to review personally records from the patient's visit with Dr. Berkley in our electronic medical record system.  These records show she was seen on 04/16/2023    IMPRESSION: right leg radicular type pain, only with seated position     Diagnosis ICD-10-CM Associated Order   1. Right ankle pain, unspecified chronicity  M25.571 X-ray ankle right AP lateral and oblique including standing     Referral to Neurology ([place])     Referral to Neurology (EXTERNAL)      2. Idiopathic peripheral neuropathy  G60.9 Referral to Neurology ([place])     Referral to Neurology (EXTERNAL)              Plan:  She mostly has pain around the lateral and anterior ankle with standing, she can exercise without a problem but occasionally feels like it is burning in this area.  I still believe this is some sort of nerve irritation.  Her symptoms do not sound  orthopedic in nature, and x-rays and MRI of the knee, and x-rays of the ankle have all been normal. we discussed waiting and watching as I think this may improve over time however she may also consult with neurology to see if they have other treatment options for her.  She would like to proceed with this consult by provided her with referrals today and she may follow-up with me on an as-needed basis.        I, Larnell Hess, LAT, ATC, OTC, was acting as a neurosurgeon for provider Charmaine Medley, MD on patient Tammy Larson Charmaine Medley, MD, personally performed the services documented. Larnell Hess, ATC acted to assist in collecting information and scribe for the visit This note and the patient instructions accurately reflect work and decisions made by me, Charmaine Medley, MD.                  [1]   Past Medical History:  Diagnosis Date    Abnormal vision     eyeglasses    Anemia     Hypertension     Low back pain     Osteoarthritis     Pain in joint     Post-operative nausea and vomiting     apfel=3    Sciatica     Seasonal allergic rhinitis    [2]   Past Surgical History:  Procedure Laterality Date    ENDOMETRIAL ABLATION      EXCISION BIOPSY WITH NEEDLE LOCALIZATION      HYSTERECTOMY  2017    HYSTEROSCOPY, MYOSURE MORCELLATION N/A 12/23/2013    Procedure: Hysteroscopic Myomectomy with Myosure;  Surgeon: Kermit Sharyne HERO, MD;  Location: [facility];  Service: Gynecology;  Laterality: N/A;    LAPAROSCOPY, DIAGNOSTIC     [3] No Known Allergies

## 2023-10-16 ENCOUNTER — Other Ambulatory Visit: Payer: Self-pay | Admitting: Family Medicine

## 2023-10-16 ENCOUNTER — Encounter: Payer: Self-pay | Admitting: Family Medicine

## 2023-10-16 DIAGNOSIS — I2510 Atherosclerotic heart disease of native coronary artery without angina pectoris: Secondary | ICD-10-CM

## 2023-10-25 ENCOUNTER — Other Ambulatory Visit: Payer: Self-pay | Admitting: Family Medicine

## 2023-10-25 DIAGNOSIS — N951 Menopausal and female climacteric states: Secondary | ICD-10-CM

## 2023-10-30 ENCOUNTER — Ambulatory Visit (INDEPENDENT_AMBULATORY_CARE_PROVIDER_SITE_OTHER): Payer: BC Managed Care – PPO | Admitting: Family Medicine

## 2023-10-30 ENCOUNTER — Encounter: Payer: Self-pay | Admitting: Family Medicine

## 2023-10-30 VITALS — BP 131/80 | HR 66 | Temp 99.0°F | Resp 16 | Ht 65.5 in | Wt 172.5 lb

## 2023-10-30 DIAGNOSIS — E569 Vitamin deficiency, unspecified: Secondary | ICD-10-CM

## 2023-10-30 DIAGNOSIS — R7989 Other specified abnormal findings of blood chemistry: Secondary | ICD-10-CM

## 2023-10-30 DIAGNOSIS — Z0000 Encounter for general adult medical examination without abnormal findings: Secondary | ICD-10-CM

## 2023-10-30 DIAGNOSIS — I10 Essential (primary) hypertension: Secondary | ICD-10-CM

## 2023-10-30 DIAGNOSIS — N951 Menopausal and female climacteric states: Secondary | ICD-10-CM

## 2023-10-30 DIAGNOSIS — M79604 Pain in right leg: Secondary | ICD-10-CM

## 2023-10-30 LAB — COMPREHENSIVE METABOLIC PANEL
ALT: 11 U/L (ref <=55)
AST (SGOT): 18 U/L (ref <=41)
Albumin/Globulin Ratio: 1.1 (ref 0.9–2.2)
Albumin: 3.9 g/dL (ref 3.5–5.0)
Alkaline Phosphatase: 51 U/L (ref 37–117)
Anion Gap: 3 — ABNORMAL LOW (ref 5.0–15.0)
BUN: 11 mg/dL (ref 7–21)
Bilirubin, Total: 0.4 mg/dL (ref 0.2–1.2)
CO2: 29 meq/L (ref 17–29)
Calcium: 8.9 mg/dL (ref 8.5–10.5)
Chloride: 107 meq/L (ref 99–111)
Creatinine: 0.8 mg/dL (ref 0.4–1.0)
GFR: >60 mL/min/{1.73_m2} (ref >=60.0)
Globulin: 3.6 g/dL (ref 2.0–3.6)
Glucose: 95 mg/dL (ref 70–100)
Hemolysis Index: 6 {index}
Potassium: 4.1 meq/L (ref 3.5–5.3)
Protein, Total: 7.5 g/dL (ref 6.0–8.3)
Sodium: 139 meq/L (ref 135–145)

## 2023-10-30 LAB — T3, FREE: T3 Free: 3.15 pg/mL (ref 1.71–3.71)

## 2023-10-30 LAB — ECG 12-LEAD
Atrial Rate: 67 {beats}/min
IHS MUSE NARRATIVE AND IMPRESSION: NORMAL
P Axis: 72 degrees
P-R Interval: 156 ms
Q-T Interval: 416 ms
QRS Duration: 76 ms
QTC Calculation (Bezet): 439 ms
R Axis: 81 degrees
T Axis: 71 degrees
Ventricular Rate: 67 {beats}/min

## 2023-10-30 LAB — VITAMIN B12: Vitamin B-12: 1009 pg/mL — ABNORMAL HIGH (ref 211–911)

## 2023-10-30 LAB — TSH: TSH: 0.53 u[IU]/mL (ref 0.35–4.94)

## 2023-10-30 LAB — T4, FREE: T4 Free: 0.94 ng/dL (ref 0.69–1.48)

## 2023-10-30 NOTE — Progress Notes (Signed)
 Fitness Evaluation:   Body Fat as percentage: In men, over 25% is obese, 20-25% is higher than normal, 16-20% is healthy / normal, <16% or under is considered lean / ideal.  2025= 38.3%  2024= 38.8%    Visceral Fat: Abdominal belly fat.Visceral fat is a type of body fat that exists in the abdomen and surrounds the internal organs. Everyone has some, especially those who are sedentary, chronically stressed, or maintain unhealthy diets. A different type of fat -- subcutaneous fat -- which builds up under the skin, has less of a negative impact on health and is easier to lose than visceral fat. A high level of visceral fat can increase your risk for serious health problems including cardiovascular disease, types 2 diabetes, and increased blood pressure.    2025= 15 (normal <10).   2024 = 15  Vision test:  pt declined. Sees ophthalmologist yearly  Patient Education: Reviewed InBody with patient.     Fitness Goals:  Decrease weight.  Increase muscle mass.  Decrease stress.      Testing:  Labs drawn from Left [facility] on first attempt using aseptic technique, without difficulty. Pressure applied to site, no bleeding noted. Gauze and coban applied to site. Patient tolerated well with no voiced complaints.    The below labs were sent to [facility] refrigerated;  one (1) Gold/SST Tube(s) - collection : centrifuged solely.    EKG:  Completed, reviewed and signed by MD in MUSE:

## 2023-10-30 NOTE — Progress Notes (Signed)
 IN OFFICE TESTING-  Results of which discussed with patient  Audiogram:  Vision: pt declined. pt sees ophthalmologist yearly.        Results of InBody Evaluation   Visceral Fat : 15 --->15.   BMI: 27.6 ---> 28.3        HEALTH CARE MAINTENANCE SCREENING  Menstrual cycles: Had hysterectomy done  Last  Pap smear :Any abnormal studies? Yes 2024    History of STDs?No  Last Mammogram? August 2024    Any abnormal studies? Yes    Last DEXA scan? none   Last Colonoscopy:   2020, repeat in 10 years  Any abnormal studies? No  COVID Vaccines completed: No  Pneumonia series completed? No  Tetanus completed: January 2023  INfluenza completedNo

## 2023-10-30 NOTE — Progress Notes (Signed)
 [facility] [place]       Tammy Larson is a 53 y.o. female who presents for a a Comprehensive Health Examination    Subjective     History of Present Illness    She currently presents with a complaint of right leg pain. The pain is localized in the knee and ankle area. The patient reports that the pain does not interfere with walking or working out. Investigations per Orthopedics which have included an MRI and x-ray have reportedly  no significant abnormalities. Per Rheumatology-  symptoms do no seem to be consistent with an inflammatory process. She has tried anti-inflammatories- but has not pursued physical therapy.    The patient also reports sleep disturbances, specifically waking up in the middle of the night and having difficulty falling back asleep. The patient is also on hormone replacement therapy for hot flashes and flushing, which have been well controlled.  Patient presents for .      SPECIFIC CONCERNS:No acute complaints or concerns at this time.     MEDICAL HISTORY:     History of Fibroids  - S/P a Myomectomy in 2015 and Total Hysterectomy - in 2017 - Ovaries not removed. Anemia resolved     Increased hot flashes and flushes   - for approximately 6  years.      Currently on transdermal estrogen - and  hot flushes notably the ones at night, disruptive of her sleep, have resolved. She does have complaints wrt increased sticky residue with large patches     Hypertension  - Home BP, usually in the 130/80's.   - taking medication consistently - no adverse symptoms associated   - on Diovan -HCT-       Gastritis  Per Endoscopy on 05/28/2022  -Gastric symptoms have decreased with elimating breads  and with dietary          IN OFFICE TESTING-  Results of which discussed with patient  Audiogram:  Vision: pt declined. pt sees ophthalmologist annually- seen last 06/2023     Results of InBody Evaluation   Visceral Fat : 16-> 15 --->15.   BMI: 27.6 ---> 28.3       HEALTH CARE MAINTENANCE SCREENING  Menstrual  cycles: S/P hysterectomy   Last  Pap smear :Any abnormal studies? Yes 2024    History of STDs?No  Last Mammogram? August 2024               Any abnormal studies? Yes     Last DEXA scan? none   Last Colonoscopy:   2020, repeat in 10 years  Any abnormal studies? No  COVID Vaccines completed: No  Pneumonia series completed? No  Tetanus completed: January 2023  INfluenza completedNo        DIET: Decreased breads. Increased vegetables and fruits. Protein sources- generally chicken, salmon frequently              Fluids: At least 64 oz per day.   Coffee: 1-2 cups of coffee per day. Stopped alcohol since January - she is optimistic and will consider increasing the frequency of her 'dry months'     EXERCISE: 3-4x/week and does a environmental health practitioner camp program 3 days per week and 1 day devoted to the treadmill     SLEEP: It has been amazing!  Gets at least 7 hours of sleep  STRESS: In general -  a little bit- with the management of her parents and their medical problems - notably her father  SOCIAL HISTORY:    Employer: [employer]  Married  Children: 1 child     Review of Systems   Constitutional:  Negative for activity change, appetite change, chills, fatigue, fever and unexpected weight change.   HENT:  Negative for postnasal drip, sinus pressure, sore throat and voice change.    Eyes:  Negative for photophobia and visual disturbance.   Respiratory:  Negative for chest tightness and shortness of breath.    Cardiovascular:  Negative for chest pain and palpitations.   Gastrointestinal:  Negative for abdominal distention, abdominal pain, constipation and diarrhea.   Endocrine: Negative for cold intolerance and heat intolerance.   Genitourinary:  Negative for difficulty urinating, dyspareunia, dysuria, menstrual problem, pelvic pain, vaginal bleeding and vaginal discharge.   Musculoskeletal:  Negative for arthralgias.        Right leg pain as per HPI   Skin:  Negative for color change and rash.   Neurological:   Negative for dizziness, weakness, light-headedness and headaches.   Hematological:  Negative for adenopathy.   Psychiatric/Behavioral:  Negative for agitation, confusion, decreased concentration and sleep disturbance (Significantly improved with topical estrogen).        Objective   BP 131/80 (BP Site: Left arm, Patient Position: Sitting, Cuff Size: Medium)   Pulse 66   Temp 99 F (37.2 C) (Oral)   Resp 16   Ht 1.664 m (5' 5.5)   Wt 78.2 kg (172 lb 8 oz)   LMP 12/11/2013   SpO2 99%   BMI 28.27 kg/m   Physical Exam  Vitals and nursing note reviewed.   Constitutional:       General: She is not in acute distress.     Appearance: Normal appearance. She is well-developed.   HENT:      Head: Normocephalic and atraumatic.      Right Ear: Tympanic membrane, ear canal and external ear normal.      Left Ear: Tympanic membrane, ear canal and external ear normal.      Mouth/Throat:      Mouth: Mucous membranes are moist.      Pharynx: No oropharyngeal exudate or posterior oropharyngeal erythema.   Eyes:      Pupils: Pupils are equal, round, and reactive to light.   Neck:      Thyroid: No thyromegaly.   Cardiovascular:      Rate and Rhythm: Normal rate and regular rhythm.      Heart sounds: Normal heart sounds and S2 normal.   Pulmonary:      Effort: Pulmonary effort is normal. No respiratory distress.      Breath sounds: Normal breath sounds.   Abdominal:      General: Abdomen is flat. There is no distension.      Palpations: Abdomen is soft.      Tenderness: There is no abdominal tenderness.   Musculoskeletal:         General: No deformity. Normal range of motion.      Cervical back: Normal range of motion and neck supple.   Skin:     General: Skin is warm and dry.   Neurological:      General: No focal deficit present.      Mental Status: She is alert and oriented to person, place, and time.      Cranial Nerves: No cranial nerve deficit.      Sensory: No sensory deficit.   Psychiatric:         Mood and Affect: Mood  normal.         Behavior: Behavior normal.         Thought Content: Thought content normal.          Results  RADIOLOGY  Knee MRI: Mild patellofemoral osteoarthritis, small joint effusion, edema at the superior and lateral aspect of Hoffa's fat pad  Ankle X-ray: Normal    DIAGNOSTIC  EKG: Normal      Assessment/Plan   1. Annual physical exam  - ECG 12 lead; Future  - ECG 12 lead  - Comprehensive Metabolic Panel; Future  - TSH; Future  - T4, Free; Future  - T3, Free; Future  - Folate; Future  - Vitamin B12; Future  - Vitamin B12  - T3, Free  - T4, Free  - TSH  - Comprehensive Metabolic Panel    2. Primary hypertension  Blood pressure controlled with home readings in 130s/80s, slightly higher in office. Medications well-tolerated. Will continue current regimen and monitoring  - Comprehensive Metabolic Panel; Future  - Comprehensive Metabolic Panel    3. Perimenopausal vasomotor symptoms  Vasomotor symptoms are well-controlled on medication  Did review potential risks associated with medication to include thromboembolic events, patient to do a trial of decreasing hormonal medication and possibly holding in the next 4-6 months based on her response  Continue to decrease or limit alcohol use to help with vasomotor symptoms  - estradiol  (VIVELLE -DOT) 0.0375 MG/24HR; Place 1 patch onto the skin twice a week  Dispense: 8 patch; Refill: 3  Discontinued - estradiol  (CLIMARA )     Sleep is better but disrupted sleep still persists. Discussed progesterone , melatonin, magnesium  - Also recommend a trial of melatonin for sleep maintenance. Encouraged Mind body wellness practices    4. Pain of right lower extremity  - Ambulatory referral to Physical Therapy; Future  Chronic pain localized to ankle and calf, likely muscular. MRI and x-ray showed no significant joint issues.  - Refer to physical therapy for evaluation and treatment.  - Consider neurology evaluation if physical therapy ineffective.    5. Abnormal thyroid blood test  -  TSH; Future  - T4, Free; Future  - T3, Free; Future  - T3, Free  - T4, Free  - TSH    6. Vitamin deficiency  - Folate; Future  - Vitamin B12; Future  - Vitamin B12    Testing as indicated    Stress Management  Managing multiple health concerns and father's care adds stress. Discussed self-care and stress management techniques to include but not limited to Mind -Body-Wellness practices  - Follow up with physical therapy progress.  - Monitor sleep and menopausal symptoms with new treatment plan.  - Consider second opinion for father's Parkinson's diagnosis.  Mammogram  02/2024  Colooscopy DUE in 2030  Shingles Vaccination  Discussed shingles vaccine, she is hesitant due to side effect concerns. Emphasized importance of vaccination.  - Consider shingles vaccination if she decides to proceed.      Verbal consent obtained to record this visit.

## 2023-11-02 ENCOUNTER — Ambulatory Visit: Payer: Self-pay | Admitting: Family Medicine

## 2023-11-02 MED ORDER — ESTRADIOL 0.0375 MG/24HR TD PTTW
1.0000 | MEDICATED_PATCH | TRANSDERMAL | 3 refills | Status: DC
Start: 2023-11-02 — End: 2024-03-02

## 2023-11-04 ENCOUNTER — Other Ambulatory Visit: Payer: Self-pay | Admitting: Family Medicine

## 2023-11-05 ENCOUNTER — Ambulatory Visit (HOSPITAL_BASED_OUTPATIENT_CLINIC_OR_DEPARTMENT_OTHER): Payer: 59 | Admitting: Family

## 2023-11-05 ENCOUNTER — Encounter (HOSPITAL_BASED_OUTPATIENT_CLINIC_OR_DEPARTMENT_OTHER): Payer: Self-pay | Admitting: Family

## 2023-11-05 VITALS — BP 131/76 | HR 73 | Ht 60.0 in | Wt 158.6 lb

## 2023-11-05 DIAGNOSIS — E785 Hyperlipidemia, unspecified: Secondary | ICD-10-CM

## 2023-11-05 DIAGNOSIS — I2510 Atherosclerotic heart disease of native coronary artery without angina pectoris: Secondary | ICD-10-CM | POA: Diagnosis not present

## 2023-11-05 DIAGNOSIS — Z720 Tobacco use: Secondary | ICD-10-CM

## 2023-11-05 DIAGNOSIS — I10 Essential (primary) hypertension: Secondary | ICD-10-CM

## 2023-11-05 DIAGNOSIS — E119 Type 2 diabetes mellitus without complications: Secondary | ICD-10-CM

## 2023-11-05 DIAGNOSIS — Z794 Long term (current) use of insulin: Secondary | ICD-10-CM

## 2023-11-05 NOTE — Patient Instructions (Signed)
 Medication Instructions:  Your physician recommends that you continue on your current medications as directed. Please refer to the Current Medication list given to you today.  We may consider changing your Losartan to Valsartan    Labwork: Your physician recommends that you return for lab work today- TSH, and renin-aldo    Follow-Up: Please follow up in 4-6 weeks in [facility] with Dr. Duke Salvia, Gillian Shields, NP or Phillips Hay PharmD    Special Instructions:  We will check in on BP in 1 week

## 2023-11-05 NOTE — Progress Notes (Signed)
 Advanced [facility]pertension [facility] Initial Assessment:    Date:  11/05/2023   ID:  [name] Hurley, DOB 10-Aug-1970, MRN [id number]  PCP:  Everrett Coombe, DO  Cardiologist:  None  Nephrologist:  Referring MD: Everrett Coombe, DO   CC: Hypertension  History of Present Illness:    [name] Hurley is a 53 y.o. female with a hx of HTN, DM2, HLD, anxiety, depression, coronary calcification on CT here to establish care in the Advanced [facility]pertension [facility].   CT chest lung cancer screening 09/22/23 with 'age advanced three-vessel coronary artery calcification'.   [name] Hurley was diagnosed with hypertension around 2012. It has been difficult to control. Blood pressure not checked routinely at home. She does have an arm cuff. she reports tobacco use 1 PPD and no alcohol use. For exercise she walks around the neighborhood three times a week and also walks her dog. No chest pain nor exertional dyspnea.  she eats  snacks, at home, and outside of the home and does follow low sodium diet.   She is having is difficult time with nausea on the 2 mg Ozempic dose and has recently started taking it every other week. She is unable to eat much and does not take her medications as a result of the nausea. She did not take her medication today, but does report taking her Amlodipine last night. Reports no shortness of breath nor dyspnea on exertion. Reports no chest pain, pressure, or tightness. No edema, orthopnea, PND. Reports no palpitations.   Current regimen: Amlodipine 10mg , Losartan 100mg , hydrochlorothiazide 12.5  Previous antihypertensives:   Past Medical History:  Diagnosis Date   Diabetes mellitus    Dyspareunia    Elevated serum creatinine    Hypertension    Sciatica     Past Surgical History:  Procedure Laterality Date   CESAREAN SECTION     paragard     removal 7-16 & reinsertion 02-13-15    Current Medications: Current Meds  Medication Sig   albuterol (VENTOLIN HFA) 108 (90 Base) MCG/ACT  inhaler Inhale 2 puffs into the lungs every 6 (six) hours as needed for wheezing or shortness of breath.   clobetasol ointment (TEMOVATE) 0.05 % Apply 1 application topically 2 (two) times daily. Apply a pea sized amount twice daily for up to 2 weeks   clonazePAM (KLONOPIN) 1 MG tablet Take 1 tablet (1 mg total) by mouth daily as needed for anxiety.   erythromycin ophthalmic ointment Place 1 application into the right eye at bedtime. (Patient taking differently: Place 1 application  into the right eye as needed.)   FLUoxetine (PROZAC) 20 MG capsule Take 1 capsule (20 mg total) by mouth daily.   fluticasone furoate-vilanterol (BREO ELLIPTA) 100-25 MCG/ACT AEPB Inhale 1 puff into the lungs daily.   glucose blood (ONETOUCH ULTRA) test strip USE AS DIRECTED TWICE DAILY TO CHECK BLOOD SUGAR   hydrochlorothiazide (MICROZIDE) 12.5 MG capsule Take 1 capsule (12.5 mg total) by mouth daily.   hydrOXYzine (ATARAX) 10 MG tablet Take 1 tablet (10 mg total) by mouth 3 (three) times daily as needed for itching.   lidocaine (XYLOCAINE) 5 % ointment Apply 1 application topically 4 (four) times daily as needed. (Patient taking differently: Apply 1 application  topically as needed.)   losartan (COZAAR) 100 MG tablet Take 1 tablet (100 mg total) by mouth daily.   meloxicam (MOBIC) 15 MG tablet Take 1 tablet (15 mg total) by mouth daily. (Patient taking differently: Take 15 mg by mouth as needed.)  metFORMIN (GLUCOPHAGE) 1000 MG tablet TAKE 1 TABLET BY MOUTH TWICE DAILY WITH A MEAL   mupirocin ointment (BACTROBAN) 2 % Apply 1 application topically 2 (two) times daily. To leg (Patient taking differently: Apply 1 application  topically as needed. To leg)   oxybutynin (DITROPAN XL) 5 MG 24 hr tablet Take 1 tablet (5 mg total) by mouth at bedtime.   OZEMPIC, 2 MG/DOSE, 8 MG/3ML SOPN INJECT 2 MG INTO THE SKIN ONCE A WEEK   pravastatin (PRAVACHOL) 40 MG tablet Take 1 tablet (40 mg total) by mouth daily.   TRESIBA FLEXTOUCH  100 UNIT/ML FlexTouch Pen INJECT 20 UNITS SUBCUTANEOUSLY ONCE DAILY   valACYclovir (VALTREX) 500 MG tablet TAKE 1 TABLET BY MOUTH ONCE DAILY, increase to one tablet po BID for 3 days with an outbreak.     Allergies:   Patient has no known allergies.   Social History   Socioeconomic History   Marital status: Married    Spouse name: Not on file   Number of children: 4   Years of education: 14   Highest education level: Not on file  Occupational History   Occupation: Nutritition   Tobacco Use   Smoking status: Every Day    Current packs/day: 1.00    Average packs/day: 1 pack/day for 22.0 years (22.0 ttl pk-yrs)    Types: Cigarettes   Smokeless tobacco: Never  Substance and Sexual Activity   Alcohol use: Not Currently   Drug use: No   Sexual activity: Yes    Partners: Male    Birth control/protection: None  Other Topics Concern   Not on file  Social History Narrative   Born and raised in [place]. Fun: Sleep   Denies any religious beliefs effecting health care.    Social Drivers of Corporate investment banker Strain: Not on file  Food Insecurity: Food Insecurity Present (11/05/2023)   Hunger Vital Sign    Worried About Running Out of Food in the Last Year: Sometimes true    Ran Out of Food in the Last Year: Sometimes true  Transportation Needs: No Transportation Needs (11/05/2023)   PRAPARE - Administrator, Civil Service (Medical): No    Lack of Transportation (Non-Medical): No  Physical Activity: Sufficiently Active (11/05/2023)   Exercise Vital Sign    Days of Exercise per Week: 3 days    Minutes of Exercise per Session: 60 min  Stress: Not on file  Social Connections: Not on file     Family History: The patient's family history includes COPD in her father and paternal grandfather; Colon cancer in her mother; Diabetes in her maternal aunt; Emphysema in her father; Hyperlipidemia in her mother; Hypertension in her brother, father, and mother. There is no history  of Heart disease.  ROS:   Please see the history of present illness.     All other systems reviewed and are negative.  EKGs/Labs/Other Studies Reviewed:    EKG Interpretation Date/Time:  Thursday November 05 2023 13:47:06 EDT Ventricular Rate:  78 PR Interval:  146 QRS Duration:  92 QT Interval:  368 QTC Calculation: 419 R Axis:   42  Text Interpretation: Normal sinus rhythm Normal ECG Confirmed by Gillian Shields ([id number]) on 11/05/2023 2:06:52 PM    Recent Labs: No results found for requested labs within last 365 days.   Recent Lipid Panel    Component Value Date/Time   CHOL 179 09/08/2022 1558   TRIG 258 (H) 09/08/2022 1558   HDL 35 (L)  09/08/2022 1558   CHOLHDL 5.1 (H) 09/08/2022 1558   VLDL 58.0 (H) 11/19/2017 1047   LDLCALC 107 (H) 09/08/2022 1558   LDLDIRECT 163 (H) 10/09/2020 0000    Physical Exam:   VS:  BP 131/76 (BP Location: Right Arm, Patient Position: Sitting, Cuff Size: Normal)   Pulse 73   Ht 5' (1.524 m)   Wt 158 lb 9.6 oz (71.9 kg)   SpO2 99%   BMI 30.97 kg/m  , BMI Body mass index is 30.97 kg/m. GENERAL:  Well appearing HEENT: Pupils equal round and reactive, fundi not visualized, oral mucosa unremarkable NECK:  No jugular venous distention, waveform within normal limits, carotid upstroke brisk and symmetric, no bruits, no thyromegaly LYMPHATICS:  No cervical adenopathy LUNGS:  Clear to auscultation bilaterally HEART:  RRR.  PMI not displaced or sustained,S1 and S2 within normal limits, no S3, no S4, no clicks, no rubs, no murmurs ABD:  Flat, positive bowel sounds normal in frequency in pitch, no bruits, no rebound, no guarding, no midline pulsatile mass, no hepatomegaly, no splenomegaly EXT:  2 plus pulses throughout, no edema, no cyanosis no clubbing SKIN:  No rashes no nodules NEURO:  Cranial nerves II through XII grossly intact, motor grossly intact throughout PSYCH:  Cognitively intact, oriented to person place and time   ASSESSMENT/PLAN:     Calcification of coronary  arteries by CT - Lung cancer screening CT 09/22/23 showed age advanced three-vessel coronary artery calcification. She reports not chest pain or shortness of breath at this time. Exercising by walking 3x per week without issue. Discussed cardiac CTA for ischemic evaluation and to quantify coronary calcium. She will consider and can discuss again at future visits. Lipid management, as below. Recommend aiming for 150 minutes of moderate intensity activity per week and following a heart healthy diet.    HTN - She has not been consistently taking her BP medications d/t nausea from her Ozempic. She will check her BP at home for the next week and check in via MyChart. Based on results, consider changing Losartan 100 mg to Olmesartan or Valsartan and potential use of combination tablet with Amlodipine. Continue Losartan 100 mg, Amlodipine 10 mg daily, hydrochlorothiazide 12.5. mg daily.  Labs today: renin-aldosterone, TSH  If BP persistently difficult to control despite medication changes, consider carotid and renal duplex.  DM2 - A1C 6.7 on 09/14/23. Managed by PCP. Significant nausea with Ozempic 2mg  and subsequently missing doses of oral medications and only taking Ozempic every other will.  Will message PCP to talk about reducing Ozempic 2 mg dose to 1 mg. If nausea persists, then could consider Mounjuro.   Tobacco use - Currently smoking a pack per day. Uninterested in cessation at this time. Smoking cessation encouraged. Recommend utilization of 1800QUITNOW.   HLD - Last lipid panel 09/08/2022 LDL: 107, triglycerides: 258. Will update labs today. Continue pravastatin 40 mg daily. LDL goal <70. If LDL not at goal, consider transition to Rosuvastatin 20mg  - 40 mg daily.  Screening for Secondary Hypertension:     11/05/2023    3:51 PM  Causes  Sleep Apnea Screened     - Comments prior sleep study unremarkable per her report  Thyroid Disease Screened     - Comments 11/05/23  TSH ordered  Hyperaldosteronism Screened     - Comments 11/05/23 renin-aldosterone ordered  Pheochromocytoma Screened     - Comments CT with normal adrenals    Relevant Labs/Studies:    Latest Ref Rng & Units 09/08/2022  3:58 PM 05/13/2021   12:00 AM 10/09/2020   12:00 AM  Basic Labs  Sodium 135 - 146 mmol/L 142  141  139   Potassium 3.5 - 5.3 mmol/L 4.8  4.1  4.1   Creatinine 0.50 - 1.03 mg/dL 9.14  7.82  9.56        Latest Ref Rng & Units 10/09/2020   12:00 AM 02/07/2016   11:19 AM  Thyroid   TSH mIU/L 1.27  0.82                   Disposition:    FU with MD/APP/PharmD in 4-6 weeks    Medication Adjustments/Labs and Tests Ordered: Current medicines are reviewed at length with the patient today.  Concerns regarding medicines are outlined above.  Orders Placed This Encounter  Procedures   Aldosterone + renin activity w/ ratio   TSH   EKG 12-Lead   No orders of the defined types were placed in this encounter.    Signed, Alver Sorrow, NP  11/05/2023 3:52 PM    [facility]

## 2023-11-06 ENCOUNTER — Encounter: Payer: Self-pay | Admitting: Family Medicine

## 2023-11-06 LAB — CMP14+EGFR
ALT: 8 IU/L (ref 0–32)
AST: 15 IU/L (ref 0–40)
Albumin: 4.4 g/dL (ref 3.8–4.9)
Alkaline Phosphatase: 104 IU/L (ref 44–121)
BUN/Creatinine Ratio: 16 (ref 9–23)
BUN: 18 mg/dL (ref 6–24)
Bilirubin Total: <0.2 mg/dL (ref 0.0–1.2)
CO2: 22 mmol/L (ref 20–29)
Calcium: 9.7 mg/dL (ref 8.7–10.2)
Chloride: 104 mmol/L (ref 96–106)
Creatinine, Ser: 1.15 mg/dL — ABNORMAL HIGH (ref 0.57–1.00)
Globulin, Total: 2.5 g/dL (ref 1.5–4.5)
Glucose: 82 mg/dL (ref 70–99)
Potassium: 4.8 mmol/L (ref 3.5–5.2)
Sodium: 143 mmol/L (ref 134–144)
Total Protein: 6.9 g/dL (ref 6.0–8.5)
eGFR: 57 mL/min/{1.73_m2} — ABNORMAL LOW (ref >59)

## 2023-11-06 LAB — LIPID PANEL
Chol/HDL Ratio: 4.7 ratio — ABNORMAL HIGH (ref 0.0–4.4)
Cholesterol, Total: 170 mg/dL (ref 100–199)
HDL: 36 mg/dL — ABNORMAL LOW (ref >39)
LDL Chol Calc (NIH): 110 mg/dL — ABNORMAL HIGH (ref 0–99)
Triglycerides: 133 mg/dL (ref 0–149)
VLDL Cholesterol Cal: 24 mg/dL (ref 5–40)

## 2023-11-06 LAB — CBC WITH DIFFERENTIAL/PLATELET
Basophils Absolute: 0.1 10*3/uL (ref 0.0–0.2)
Basos: 1 %
EOS (ABSOLUTE): 0.5 10*3/uL — ABNORMAL HIGH (ref 0.0–0.4)
Eos: 5 %
Hematocrit: 38.5 % (ref 34.0–46.6)
Hemoglobin: 12.4 g/dL (ref 11.1–15.9)
Immature Grans (Abs): 0 10*3/uL (ref 0.0–0.1)
Immature Granulocytes: 0 %
Lymphocytes Absolute: 2.7 10*3/uL (ref 0.7–3.1)
Lymphs: 24 %
MCH: 28 pg (ref 26.6–33.0)
MCHC: 32.2 g/dL (ref 31.5–35.7)
MCV: 87 fL (ref 79–97)
Monocytes Absolute: 0.6 10*3/uL (ref 0.1–0.9)
Monocytes: 5 %
Neutrophils Absolute: 7.3 10*3/uL — ABNORMAL HIGH (ref 1.4–7.0)
Neutrophils: 65 %
Platelets: 222 10*3/uL (ref 150–450)
RBC: 4.43 x10E6/uL (ref 3.77–5.28)
RDW: 13.5 % (ref 11.7–15.4)
WBC: 11.2 10*3/uL — ABNORMAL HIGH (ref 3.4–10.8)

## 2023-11-06 LAB — HIV ANTIBODY (ROUTINE TESTING W REFLEX): HIV Screen 4th Generation wRfx: NONREACTIVE

## 2023-11-07 ENCOUNTER — Encounter: Payer: Self-pay | Admitting: Family Medicine

## 2023-11-10 ENCOUNTER — Other Ambulatory Visit (HOSPITAL_COMMUNITY): Payer: Self-pay

## 2023-11-10 ENCOUNTER — Telehealth: Payer: Self-pay

## 2023-11-10 ENCOUNTER — Encounter (HOSPITAL_BASED_OUTPATIENT_CLINIC_OR_DEPARTMENT_OTHER): Payer: Self-pay

## 2023-11-10 LAB — ALDOSTERONE + RENIN ACTIVITY W/ RATIO
Aldos/Renin Ratio: 0.8 (ref 0.0–30.0)
Aldosterone: 1.7 ng/dL (ref 0.0–30.0)
Renin Activity, Plasma: 2.063 ng/mL/h (ref 0.167–5.380)

## 2023-11-10 LAB — TSH: TSH: 1.05 u[IU]/mL (ref 0.450–4.500)

## 2023-11-10 MED ORDER — MOUNJARO 7.5 MG/0.5ML ~~LOC~~ SOAJ: 7.5000 mg | SUBCUTANEOUS | 1 refills | Status: DC

## 2023-11-10 NOTE — Telephone Encounter (Signed)
 Pharmacy Patient Advocate Encounter   Received notification from Patient Pharmacy that prior authorization for Mounjaro 7.5 is required/requested.   Insurance verification completed.   The patient is insured through [facility] .   Per test claim: PA required; PA submitted to above mentioned insurance via CoverMyMeds Key/confirmation #/EOC [id number] Status is pending

## 2023-11-10 NOTE — Telephone Encounter (Signed)
 Pharmacy Patient Advocate Encounter  Received notification from [facility] that Prior Authorization for Mounjaro 7.5 has been APPROVED from 11/09/23 to 11/09/26. Ran test claim, Copay is $25.00. This test claim was processed through [facility]- copay amounts may vary at other pharmacies due to pharmacy/plan contracts, or as the patient moves through the different stages of their insurance plan.   PA #/Case ID/Reference #: [id number]

## 2023-11-10 NOTE — Addendum Note (Signed)
 Addended by: Samyuktha Brau E on: 11/10/2023 10:15 AM   Modules accepted: Orders

## 2023-11-12 ENCOUNTER — Ambulatory Visit: Payer: Self-pay | Admitting: Podiatry

## 2023-11-12 ENCOUNTER — Encounter (HOSPITAL_BASED_OUTPATIENT_CLINIC_OR_DEPARTMENT_OTHER): Payer: Self-pay

## 2023-11-12 ENCOUNTER — Ambulatory Visit (INDEPENDENT_AMBULATORY_CARE_PROVIDER_SITE_OTHER)

## 2023-11-12 ENCOUNTER — Encounter: Payer: Self-pay | Admitting: Podiatry

## 2023-11-12 DIAGNOSIS — Z01818 Encounter for other preprocedural examination: Secondary | ICD-10-CM

## 2023-11-12 DIAGNOSIS — M2041 Other hammer toe(s) (acquired), right foot: Secondary | ICD-10-CM

## 2023-11-15 NOTE — Progress Notes (Signed)
 Subjective:   Patient ID: [name] Hurley, female   DOB: 53 y.o.   MRN: [id number]   HPI Patient presents stating that she is ready to have these toes fixed that her sugar is under much better control and she has an A1c now under 7.  Patient states the toes get very tender she has tried wider shoes she has tried padding without relief   ROS      Objective:  Physical Exam  Neurovascular status intact with rigid contracture digits 2 3 right foot with redness palpable  Irritation.  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  I have recommended digital fusion digits 2 3 right foot and explained procedure risk she wants to get this done and will wait till June to get it done.  Scheduled for outpatient surgery  X-rays do indicate moderate elevation of the lesser digits no other indication of pathology

## 2023-11-16 ENCOUNTER — Other Ambulatory Visit: Payer: Self-pay | Admitting: Family Medicine

## 2023-11-16 DIAGNOSIS — Z1231 Encounter for screening mammogram for malignant neoplasm of breast: Secondary | ICD-10-CM

## 2023-11-19 ENCOUNTER — Encounter (HOSPITAL_BASED_OUTPATIENT_CLINIC_OR_DEPARTMENT_OTHER): Payer: Self-pay

## 2023-11-23 NOTE — Telephone Encounter (Signed)
 BP trending in the right direction. Glad her nausea is improving! Recommend continuing current medications and follow up as scheduled with Kristin 12/04/23.   Pammy Vesey S Antonios Ostrow, NP

## 2023-11-23 NOTE — Telephone Encounter (Signed)
BP log as requested 

## 2023-11-24 ENCOUNTER — Ambulatory Visit
Admission: RE | Admit: 2023-11-24 | Discharge: 2023-11-24 | Disposition: A | Discharge status: Home / Self Care | Source: Ambulatory Visit

## 2023-11-24 DIAGNOSIS — Z1231 Encounter for screening mammogram for malignant neoplasm of breast: Secondary | ICD-10-CM

## 2023-11-26 ENCOUNTER — Telehealth: Payer: Self-pay | Admitting: Podiatry

## 2023-11-26 NOTE — Telephone Encounter (Signed)
 DOS:   12/22/23  (RT) 2ND/3RD HAMMER TOE [id number]    EFFECTIVE DATE:   07/29/23  DEDUCTIBLE:  $1,500.00  REMAINING:   $1,500.00  OOP:   $5,900.00  REMAINING :   $5,559.87  CO INSURANCE: 0%  PER SASA V OF [employer] NO PRIOR AUTH IS REQ FOR CPT CODE 98119

## 2023-11-26 NOTE — Telephone Encounter (Signed)
 ZOX#[id number]

## 2023-12-02 DIAGNOSIS — Z0271 Encounter for disability determination: Secondary | ICD-10-CM

## 2023-12-02 NOTE — Telephone Encounter (Signed)
 cld pt to confirm her last day at work prior to surgery 12/22/23. Her leave will be 12/18/23-02/26/24. I adv would fax [employer] [phone] and if she feels she can go back sooner I can always revise her RTW

## 2023-12-04 ENCOUNTER — Ambulatory Visit (HOSPITAL_BASED_OUTPATIENT_CLINIC_OR_DEPARTMENT_OTHER)

## 2023-12-04 NOTE — Progress Notes (Deleted)
 Office Visit    Patient Name: [name] Hurley Date of Encounter: 12/04/2023  Primary Care Provider:  Adela Holter, DO Primary Cardiologist:  None  Chief Complaint    Hypertension - Advanced hypertension clinic  Past Medical History   CAD Coronary calcification on CT  DM2   HLD           No Known Allergies  History of Present Illness    [name] Hurley is a 53 y.o. female patient who was referred to the Advanced Hypertension Clinic   Blood Pressure Goal:  130/80  Current Medications:   Adherence Assessment  Do you ever forget to take your medication? [] Yes [] No  Do you ever skip doses due to side effects? [] Yes [] No  Do you have trouble affording your medicines? [] Yes [] No  Are you ever unable to pick up your medication due to transportation difficulties? [] Yes [] No  Do you ever stop taking your medications because you don't believe they are helping? [] Yes [] No  Do you check your weight daily? [] Yes [] No   Adherence strategy: ***  Barriers to obtaining medications: ***  Previously tried:     Family Hx:     Social Hx:      Tobacco:  Alcohol:  Caffeine:    Diet:      Exercise:   Home BP readings:       Accessory Clinical Findings    Lab Results  Component Value Date   CREATININE 1.15 (H) 11/05/2023   BUN 18 11/05/2023   NA 143 11/05/2023   K 4.8 11/05/2023   CL 104 11/05/2023   CO2 22 11/05/2023   Lab Results  Component Value Date   ALT 8 11/05/2023   AST 15 11/05/2023   ALKPHOS 104 11/05/2023   BILITOT <0.2 11/05/2023   Lab Results  Component Value Date   HGBA1C 6.7 09/14/2023    Screening for Secondary Hypertension: { Click here to document screening for secondary causes of HTN  :1}     11/05/2023    3:51 PM  Causes  Sleep Apnea Screened     - Comments prior sleep study unremarkable per her report  Thyroid Disease Screened     - Comments 11/05/23 TSH ordered  Hyperaldosteronism Screened     - Comments 11/05/23  renin-aldosterone ordered  Pheochromocytoma Screened     - Comments CT with normal adrenals    Relevant Labs/Studies:    Latest Ref Rng & Units 11/05/2023    2:54 PM 09/08/2022    3:58 PM 05/13/2021   12:00 AM  Basic Labs  Sodium 134 - 144 mmol/L 143  142  141   Potassium 3.5 - 5.2 mmol/L 4.8  4.8  4.1   Creatinine 0.57 - 1.00 mg/dL 1.91  4.78  2.95        Latest Ref Rng & Units 11/05/2023    2:59 PM 10/09/2020   12:00 AM  Thyroid   TSH 0.450 - 4.500 uIU/mL 1.050  1.27        Latest Ref Rng & Units 11/05/2023    2:59 PM  Renin/Aldosterone   Aldosterone 0.0 - 30.0 ng/dL 1.7   Aldos/Renin Ratio 0.0 - 30.0 0.8                Home Medications    Current Outpatient Medications  Medication Sig Dispense Refill   albuterol  (VENTOLIN  HFA) 108 (90 Base) MCG/ACT inhaler Inhale 2 puffs into the lungs every 6 (six) hours as needed for wheezing or  shortness of breath. 8 g 0   amLODipine  (NORVASC ) 10 MG tablet Take 1 tablet (10 mg total) by mouth daily. 90 tablet 1   clobetasol  ointment (TEMOVATE ) 0.05 % Apply 1 application topically 2 (two) times daily. Apply a pea sized amount twice daily for up to 2 weeks 30 g 0   clonazePAM  (KLONOPIN ) 1 MG tablet Take 1 tablet (1 mg total) by mouth daily as needed for anxiety. 20 tablet 1   erythromycin  ophthalmic ointment Place 1 application into the right eye at bedtime. (Patient taking differently: Place 1 application  into the right eye as needed.) 3.5 g 0   FLUoxetine  (PROZAC ) 20 MG capsule Take 1 capsule (20 mg total) by mouth daily. 90 capsule 3   fluticasone  furoate-vilanterol (BREO ELLIPTA ) 100-25 MCG/ACT AEPB Inhale 1 puff into the lungs daily. 1 each 11   glucose blood (ONETOUCH ULTRA) test strip USE AS DIRECTED TWICE DAILY TO CHECK BLOOD SUGAR 100 each PRN   hydrochlorothiazide  (MICROZIDE ) 12.5 MG capsule Take 1 capsule (12.5 mg total) by mouth daily. 90 capsule 1   hydrOXYzine  (ATARAX ) 10 MG tablet Take 1 tablet (10 mg total) by mouth 3  (three) times daily as needed for itching. 20 tablet 0   lidocaine  (XYLOCAINE ) 5 % ointment Apply 1 application topically 4 (four) times daily as needed. (Patient taking differently: Apply 1 application  topically as needed.) 30 g 1   losartan  (COZAAR ) 100 MG tablet Take 1 tablet (100 mg total) by mouth daily. 90 tablet 3   meloxicam  (MOBIC ) 15 MG tablet Take 1 tablet (15 mg total) by mouth daily. (Patient taking differently: Take 15 mg by mouth as needed.) 30 tablet 0   metFORMIN  (GLUCOPHAGE ) 1000 MG tablet TAKE 1 TABLET BY MOUTH TWICE DAILY WITH A MEAL 180 tablet 1   mupirocin  ointment (BACTROBAN ) 2 % Apply 1 application topically 2 (two) times daily. To leg (Patient taking differently: Apply 1 application  topically as needed. To leg) 22 g 0   oxybutynin  (DITROPAN  XL) 5 MG 24 hr tablet Take 1 tablet (5 mg total) by mouth at bedtime. 30 tablet 0   pravastatin  (PRAVACHOL ) 40 MG tablet Take 1 tablet (40 mg total) by mouth daily. 90 tablet 1   tirzepatide (MOUNJARO) 7.5 MG/0.5ML Pen Inject 7.5 mg into the skin once a week. 2 mL 1   TRESIBA  FLEXTOUCH 100 UNIT/ML FlexTouch Pen INJECT 20 UNITS SUBCUTANEOUSLY ONCE DAILY 15 mL 5   valACYclovir  (VALTREX ) 500 MG tablet TAKE 1 TABLET BY MOUTH ONCE DAILY, increase to one tablet po BID for 3 days with an outbreak. 30 tablet 0   No current facility-administered medications for this visit.     Assessment & Plan   No BP recorded.  {Refresh Note OR Click here to enter BP  :1}***   No problem-specific Assessment & Plan notes found for this encounter.   Kaamil Morefield PharmD CPP [facility]  [address] [place], [place] [address] [phone]

## 2023-12-07 ENCOUNTER — Ambulatory Visit (HOSPITAL_BASED_OUTPATIENT_CLINIC_OR_DEPARTMENT_OTHER)

## 2023-12-14 ENCOUNTER — Telehealth: Payer: Self-pay | Admitting: Podiatry

## 2023-12-14 NOTE — Telephone Encounter (Signed)
 Pt called and cxled surgery for 5/27 as the surgery center contacted her and she would have to pay money up front and cannot do that at the moment. She will call to r/s surgery and it maybe next year before she can have the surgery.  I contacted surgery center and cxled surgery.

## 2023-12-26 ENCOUNTER — Other Ambulatory Visit: Payer: Self-pay | Admitting: Family Medicine

## 2023-12-26 DIAGNOSIS — I10 Essential (primary) hypertension: Secondary | ICD-10-CM

## 2023-12-28 ENCOUNTER — Encounter: Admitting: Podiatry

## 2024-01-11 ENCOUNTER — Encounter: Admitting: Podiatry

## 2024-01-14 ENCOUNTER — Ambulatory Visit: Payer: 59 | Admitting: Family Medicine

## 2024-01-17 NOTE — Progress Notes (Deleted)
 Cardiology Office Note:   Date:  01/17/2024  ID:  [name] Hurley, DOB 1971-03-09, MRN [id number] PCP:  Alvia Bring, DO  [facility] Providers Cardiologist:  Wendel Haws, MD Referring MD: Alvia Bring, DO  Chief Complaint/Reason for Referral:  CAD ASSESSMENT:    1. Coronary artery calcification seen on CAT scan   2. Type 2 diabetes mellitus without complication, with long-term current use of insulin  (HCC)   3. Hypertension associated with diabetes (HCC)   4. Hyperlipidemia associated with type 2 diabetes mellitus (HCC)   5. CKD stage 3 due to type 2 diabetes mellitus (HCC)   6. BMI 31.0-31.9,adult     PLAN:   In order of problems listed above: CAD:  Start aspirin 81 mg, continue pravastatin  40 mg.  Start as needed nitroglycerin..*** T2DM: Start aspirin 81 mg, continue losartan  100 mg, continue statin, and start Jardiance  10 mg daily.*** Hypertension: Continue amlodipine  10 mg, hydrochlorothiazide  12.5 mg, losartan  100 mg.  Obtain echocardiogram.  Renal u/s?*** Hyperlipidemia: LDL recently was at 110.  Stop pravastatin  and start Crestor 20 mg daily.  Check lipid panel, LFTs, LP(a) in 2 months.*** CKD stage III: Continue losartan  100 mg and start Jardiance  10 mg daily Elevated BMI: Continue Mounjaro  at 7.5 mg q. weekly        {Are you ordering a CV Procedure (e.g. stress test, cath, DCCV, TEE, etc)?   Press F2        :[id number]}   Dispo:  No follow-ups on file.      Medication Adjustments/Labs and Tests Ordered: Current medicines are reviewed at length with the patient today.  Concerns regarding medicines are outlined above.  The following changes have been made:  {PLAN; NO CHANGE:13088:s}   Labs/tests ordered: No orders of the defined types were placed in this encounter.   Medication Changes: No orders of the defined types were placed in this encounter.   Current medicines are reviewed at length with the patient today.  The patient {ACTIONS; HAS/DOES NOT  HAVE:19233} concerns regarding medicines.  I spent *** minutes reviewing all clinical data during and prior to this visit including all relevant imaging studies, laboratories, clinical information from other health systems and prior notes from both Cardiology and other specialties, interviewing the patient, conducting a complete physical examination, and coordinating care in order to formulate a comprehensive and personalized evaluation and treatment plan.   History of Present Illness:    FOCUSED PROBLEM LIST:   Coronary calcification Chest CT 2025 Hyperlipidemia Hypertension Aldo/renin 0.8 T2DM Not on insulin  CKDIIIA BMI 31  6/25:  Patient consents to use of AI scribe. The patient returns for routine follow-up.  She was last seen in April of this year.  She 90 exertional angina or dyspnea.  She is having issues with nausea in response to her Ozempic .  Her blood pressure was mildly elevated.  No changes were made to her medical regimen.  Renin, aldosterone, and TSH levels were drawn.  These were within normal limits.     Current Medications: No outpatient medications have been marked as taking for the 01/21/24 encounter (Appointment) with Beuford Garcilazo K, MD.     Review of Systems:   Please see the history of present illness.    All other systems reviewed and are negative.     EKGs/Labs/Other Test Reviewed:   EKG:  2025 NSR  EKG Interpretation Date/Time:    Ventricular Rate:    PR Interval:    QRS Duration:    QT Interval:  QTC Calculation:   R Axis:      Text Interpretation:           Risk Assessment/Calculations:   {Does this patient have ATRIAL FIBRILLATION?[phone]}      Physical Exam:   VS:  LMP 08/29/2019 (Approximate)    No BP recorded.  {Refresh Note OR Click here to enter BP  :1}***   Wt Readings from Last 3 Encounters:  11/05/23 158 lb 9.6 oz (71.9 kg)  09/22/23 158 lb (71.7 kg)  07/06/23 158 lb (71.7 kg)      GENERAL:  No apparent  distress, AOx3 HEENT:  No carotid bruits, +2 carotid impulses, no scleral icterus CAR: RRR Irregular RR*** no murmurs***, gallops, rubs, or thrills RES:  Clear to auscultation bilaterally ABD:  Soft, nontender, nondistended, positive bowel sounds x 4 VASC:  +2 radial pulses, +2 carotid pulses NEURO:  CN 2-12 grossly intact; motor and sensory grossly intact PSYCH:  No active depression or anxiety EXT:  No edema, ecchymosis, or cyanosis  Signed, Norlene Lanes K Yasuko Lapage, MD  01/17/2024 9:56 AM    [facility] [address] [place], [place], [place]  [address] Phone: [phone]; Fax: [phone]   Note:  This document was prepared using Dragon voice recognition software and may include unintentional dictation errors.

## 2024-01-21 ENCOUNTER — Ambulatory Visit: Admitting: Internal Medicine

## 2024-01-21 DIAGNOSIS — E1122 Type 2 diabetes mellitus with diabetic chronic kidney disease: Secondary | ICD-10-CM

## 2024-01-21 DIAGNOSIS — Z6831 Body mass index (BMI) 31.0-31.9, adult: Secondary | ICD-10-CM

## 2024-01-21 DIAGNOSIS — I152 Hypertension secondary to endocrine disorders: Secondary | ICD-10-CM

## 2024-01-21 DIAGNOSIS — E1169 Type 2 diabetes mellitus with other specified complication: Secondary | ICD-10-CM

## 2024-01-21 DIAGNOSIS — E119 Type 2 diabetes mellitus without complications: Secondary | ICD-10-CM

## 2024-01-21 DIAGNOSIS — I2510 Atherosclerotic heart disease of native coronary artery without angina pectoris: Secondary | ICD-10-CM

## 2024-02-16 ENCOUNTER — Ambulatory Visit: Admitting: Family Medicine

## 2024-02-22 ENCOUNTER — Encounter: Payer: Self-pay | Admitting: Family Medicine

## 2024-02-22 ENCOUNTER — Ambulatory Visit (INDEPENDENT_AMBULATORY_CARE_PROVIDER_SITE_OTHER): Admitting: Family Medicine

## 2024-02-22 VITALS — BP 152/73 | HR 73 | Resp 20 | Ht 60.0 in | Wt 159.0 lb

## 2024-02-22 DIAGNOSIS — F418 Other specified anxiety disorders: Secondary | ICD-10-CM

## 2024-02-22 DIAGNOSIS — I10 Essential (primary) hypertension: Secondary | ICD-10-CM | POA: Diagnosis not present

## 2024-02-22 DIAGNOSIS — E119 Type 2 diabetes mellitus without complications: Secondary | ICD-10-CM

## 2024-02-22 DIAGNOSIS — Z794 Long term (current) use of insulin: Secondary | ICD-10-CM

## 2024-02-22 LAB — POCT GLYCOSYLATED HEMOGLOBIN (HGB A1C): HbA1c, POC (controlled diabetic range): 5.9 % (ref 0.0–7.0)

## 2024-02-22 MED ORDER — MOUNJARO 7.5 MG/0.5ML ~~LOC~~ SOAJ: 7.5000 mg | SUBCUTANEOUS | 1 refills | Status: DC

## 2024-02-22 MED ORDER — LOSARTAN POTASSIUM 100 MG PO TABS: 100.0000 mg | ORAL_TABLET | Freq: Every day | ORAL | 2 refills | Status: AC

## 2024-02-22 MED ORDER — AMLODIPINE BESYLATE 10 MG PO TABS: 10.0000 mg | ORAL_TABLET | Freq: Every day | ORAL | 2 refills | Status: AC

## 2024-02-22 MED ORDER — HYDROCHLOROTHIAZIDE 12.5 MG PO CAPS: 12.5000 mg | ORAL_CAPSULE | Freq: Every day | ORAL | 2 refills | Status: AC

## 2024-02-22 NOTE — Progress Notes (Signed)
 [name] Hurley - 53 y.o. 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 female here today for follow up visit.    She reports that she is doing well.    Continues on mounjaro  7.5mg  and tresiba  for management diabetes.  She is tolerating mounjaro  much better compared to Ozempic .  A1c today is 5.9%.SABRA   HTN is managed with losartan , amlodipine  and hydrochlorothiazide .  She is tolerating medications well, but doesn't think she has been taking hydrochlorothiazide .  Has been evaluated by advanced hypertension clinic as well as BP has remained elevated with current medications.  She does continue to smoke heavily.  She had negative work up for 2/2 causes of HTN.  She has been monitoring BP at home and readings are typically mid 130's-140's/80's.   Anxiety seems better at this time.   She remains on fluoxetine  20mg  daily.  She had not been taking regularly due to nausea from Ozempic , but feels better now that she is taking consistently.   Uses vistaril  as needed and clonazepam  prn for severe anxiety.   ROS:  A comprehensive ROS was completed and negative except as noted per HPI  No Known Allergies  Past Medical History:  Diagnosis Date   Diabetes mellitus    Dyspareunia    Elevated serum creatinine    Hypertension    Sciatica     Past Surgical History:  Procedure Laterality Date   CESAREAN SECTION     paragard      removal 7-16 & reinsertion 02-13-15    Social History   Socioeconomic History   Marital status: Married    Spouse name: Not on file   Number of children: 4   Years of education: 14   Highest education level: Associate degree: occupational, Scientist, product/process development, or  vocational program  Occupational History   Occupation: Nutritition   Tobacco Use   Smoking status: Every Day    Current packs/day: 1.00    Average packs/day: 1 pack/day for 22.0 years (22.0 ttl pk-yrs)    Types: Cigarettes   Smokeless tobacco: Never  Substance and Sexual Activity   Alcohol use: Not Currently   Drug use: No   Sexual activity: Yes    Partners: Male    Birth control/protection: None  Other Topics Concern   Not on file  Social History Narrative   Born and raised in [place]. Fun: Sleep   Denies any religious beliefs effecting health care.    Social Drivers of Corporate investment banker Strain: High Risk (01/11/2024)   Overall Financial Resource Strain (CARDIA)    Difficulty of Paying Living Expenses: Very hard  Food Insecurity: Food Insecurity Present (01/11/2024)   Hunger Vital Sign    Worried About Running Out of Food in the Last Year: Sometimes true    Ran Out of Food in the Last Year: Sometimes true  Transportation Needs: No Transportation Needs (01/11/2024)   PRAPARE - Administrator, Civil Service (Medical): No    Lack of Transportation (Non-Medical): No  Physical Activity: Inactive (01/11/2024)   Exercise Vital Sign    Days of Exercise per Week: 0 days    Minutes of Exercise per Session: Not on file  Stress: Stress Concern Present (01/11/2024)   [employer] of Occupational Health - Occupational Stress Questionnaire    Feeling of Stress: To some extent  Social Connections: Moderately Isolated (01/11/2024)   Social Connection and Isolation Panel    Frequency of Communication with Friends and Family: More than three times a week    Frequency of Social Gatherings with Friends and Family: Never    Attends Religious Services: Never    Database administrator or Organizations: No    Attends Engineer, structural: Not on file    Marital Status: Married    Family History  Problem Relation Age of Onset   Hypertension Mother     Hyperlipidemia Mother    Colon cancer Mother    Hypertension Father    COPD Father    Emphysema Father    Diabetes Maternal Aunt    COPD Paternal Grandfather    Hypertension Brother    Heart disease Neg Hx    Breast cancer Neg Hx    BRCA 1/2 Neg Hx     Health Maintenance  Topic Date Due   Hepatitis B Vaccines (1 of 3 - 19+ 3-dose series) Never done   OPHTHALMOLOGY EXAM  10/23/2022   Diabetic kidney evaluation - Urine ACR  02/04/2023   Fecal DNA (Cologuard)  03/10/2024 (Originally 03/08/2023)   INFLUENZA VACCINE  02/26/2024   HEMOGLOBIN A1C  03/13/2024   FOOT EXAM  09/13/2024   Lung Cancer Screening  09/21/2024   Diabetic kidney evaluation - eGFR measurement  11/04/2024   Cervical Cancer Screening (HPV/Pap Cotest)  04/11/2025   MAMMOGRAM  11/23/2025   DTaP/Tdap/Td (2 - Td or Tdap) 02/19/2030   Pneumococcal Vaccine 25-71 Years old  Completed   COVID-19 Vaccine  Completed   Hepatitis C Screening  Completed   HIV Screening  Completed   Zoster Vaccines- Shingrix  Completed   HPV VACCINES  Aged Out   Meningococcal B Vaccine  Aged Out     ----------------------------------------------------------------------------------------------------------------------------------------------------------------------------------------------------------------- Physical Exam BP (!) 152/73   Pulse 73   Resp 20   Ht 5' (1.524 m)   Wt 159 lb (72.1 kg)   LMP 08/29/2019 (Approximate)   SpO2 100%   BMI 31.05 kg/m   Physical Exam Constitutional:      Appearance: Normal appearance.  HENT:     Head: Normocephalic and atraumatic.  Eyes:     General: No scleral icterus. Cardiovascular:     Rate and Rhythm: Normal rate and regular rhythm.  Pulmonary:     Effort: Pulmonary effort is normal.     Breath sounds: Normal breath sounds.  Neurological:     Mental Status: She is alert.  Psychiatric:        Mood and Affect: Mood normal.        Behavior: Behavior normal.      ------------------------------------------------------------------------------------------------------------------------------------------------------------------------------------------------------------------- Assessment and Plan  Anxiety with depression Doing well now that she is taking fluoxetine  consistently.  May use clonazepam  sparingly prn.  Continue regular visits with therapist.   Essential hypertension Readings at home are better.  She is not taking hydrochlorothiazide .  Will renew this. Continue amlodipine  and losartan .   Type 2 diabetes mellitus without complication, with long-term current use of insulin  (HCC) Blood sugars are well controlled.  Continue current medications.     Meds ordered this encounter  Medications   amLODipine  (NORVASC ) 10 MG tablet    Sig: Take 1 tablet (10 mg total) by mouth daily.    Dispense:  90  tablet    Refill:  2   hydrochlorothiazide  (MICROZIDE ) 12.5 MG capsule    Sig: Take 1 capsule (12.5 mg total) by mouth daily.    Dispense:  90 capsule    Refill:  2   losartan  (COZAAR ) 100 MG tablet    Sig: Take 1 tablet (100 mg total) by mouth daily.    Dispense:  90 tablet    Refill:  2   tirzepatide  (MOUNJARO ) 7.5 MG/0.5ML Pen    Sig: Inject 7.5 mg into the skin once a week.    Dispense:  2 mL    Refill:  1    Please use discount coupon    No follow-ups on file.

## 2024-02-22 NOTE — Assessment & Plan Note (Signed)
 Blood sugars are well controlled.  Continue current medications.

## 2024-02-22 NOTE — Patient Instructions (Signed)
 Take losartan , hydrochlorothiazide  and amlodipine  each day for blood pressure.

## 2024-02-22 NOTE — Assessment & Plan Note (Signed)
 Doing well now that she is taking fluoxetine  consistently.  May use clonazepam  sparingly prn.  Continue regular visits with therapist.

## 2024-02-22 NOTE — Assessment & Plan Note (Signed)
 Readings at home are better.  She is not taking hydrochlorothiazide .  Will renew this. Continue amlodipine  and losartan .

## 2024-02-22 NOTE — Addendum Note (Signed)
 Addended by: FRANCHOT ARTA PEDLAR on: 02/22/2024 11:44 AM   Modules accepted: Orders

## 2024-03-02 ENCOUNTER — Other Ambulatory Visit: Payer: Self-pay | Admitting: Family Medicine

## 2024-03-02 ENCOUNTER — Encounter: Payer: Self-pay | Admitting: Family Medicine

## 2024-03-02 DIAGNOSIS — Z1231 Encounter for screening mammogram for malignant neoplasm of breast: Secondary | ICD-10-CM

## 2024-03-02 DIAGNOSIS — N951 Menopausal and female climacteric states: Secondary | ICD-10-CM

## 2024-03-02 DIAGNOSIS — N6092 Unspecified benign mammary dysplasia of left breast: Secondary | ICD-10-CM

## 2024-04-29 ENCOUNTER — Other Ambulatory Visit: Payer: Self-pay | Admitting: Family Medicine

## 2024-04-29 DIAGNOSIS — N951 Menopausal and female climacteric states: Secondary | ICD-10-CM

## 2024-05-01 ENCOUNTER — Other Ambulatory Visit: Payer: Self-pay | Admitting: Family Medicine

## 2024-05-01 DIAGNOSIS — N951 Menopausal and female climacteric states: Secondary | ICD-10-CM

## 2024-05-01 MED ORDER — ESTRADIOL 0.0375 MG/24HR TD PTTW: 1.0000 | MEDICATED_PATCH | TRANSDERMAL | 1 refills | Status: DC

## 2024-05-10 ENCOUNTER — Encounter: Payer: Self-pay | Admitting: Family Medicine

## 2024-05-31 ENCOUNTER — Telehealth: Payer: Self-pay

## 2024-05-31 NOTE — Telephone Encounter (Signed)
 Hello,   Our team has you confirmed for a complimentary virtual consultation via MyChart on 06/07/24 @ 11 AM with Dr. Donita. Access to MyChart FAQ's and assistance.  At the time of your consultation, complete all pre-check-in and registration paperwork.   Patients may be asked to upload a photo of your photo ID and insurance card.   If you need any assistance connecting at the time of your consultation, contact our team at [phone], for immediate assistance.   We look forward to welcoming you,     Burnard Epps    Burnard Epps, MHA  Patient Access Associate 3  [phone] [facility] [place]

## 2024-06-04 ENCOUNTER — Other Ambulatory Visit: Payer: Self-pay | Admitting: Family Medicine

## 2024-06-07 ENCOUNTER — Encounter

## 2024-06-07 ENCOUNTER — Telehealth: Payer: Self-pay

## 2024-06-07 NOTE — Telephone Encounter (Signed)
 [name] Hurley is due for a Cologuard.  Left message for a return call.

## 2024-06-07 NOTE — Progress Notes (Signed)
 Tammy Larson is a 53 y.o. female who is here today for an informal meet the doctor visit to see if there is mutual interest on behalf of the patient and provider in switching to my panel. She has decided based on her needs and expectations  to switch, and she will be scheduled for an annual physical appointment to discuss her specific clinical history and medical concerns.    Maisie JAYSON Spires, MD  [place] [address]

## 2024-06-09 ENCOUNTER — Other Ambulatory Visit: Payer: Self-pay | Admitting: Family Medicine

## 2024-06-09 DIAGNOSIS — E11319 Type 2 diabetes mellitus with unspecified diabetic retinopathy without macular edema: Secondary | ICD-10-CM

## 2024-06-28 ENCOUNTER — Ambulatory Visit: Admitting: Family Medicine

## 2024-06-30 ENCOUNTER — Telehealth: Payer: Self-pay

## 2024-06-30 NOTE — Telephone Encounter (Signed)
 Please call Tammy Larson to triage.     Reason for call: light discharge, mild lower back pain for over a week. Patient mentioned no other sxs.     Call back number: [phone]

## 2024-06-30 NOTE — Telephone Encounter (Signed)
 Patient notified of provider's annotations and Randall will help her with scheduling.

## 2024-06-30 NOTE — Telephone Encounter (Signed)
 She will need an appointment. Dr Donita is out until Tuesday, so she can see any of our physicians in any of our 5 offices tomorrow. Please facilitate an appointment for her accordingly.     If she has a GYN, she can see them also.    She will need vaginal and urine cultures    In Good Health,    Particia Jung , MD 2:18 PM 06/30/2024

## 2024-06-30 NOTE — Telephone Encounter (Signed)
 Spoke with patient and she is willing to go to any location except [place].     Please advise if any availability would work for tomorrow per Dr. Sherleen.

## 2024-06-30 NOTE — Telephone Encounter (Addendum)
 Telephone call to Tammy Larson to discuss symptoms    Concern: Vaginal discharge and  lower back pain    Onset: 7 days ago    Associated symptoms: clear light vaginal discharge, lower back pain, urge to urinate and slightly increased urinary frequency.    Negative for: fever, abdominal pain, vaginal itching/rash/redness, unusual odor or pain with urination,     Meds: none    Notes: Patient reports constant vaginal discharge and lower abdominal pain x 1 week. Discharge is clear and is seen everyday. Lower back pain is described as a constant dull ache but not unbearable. Patient also reports urge to urinate and slightly increased urinary frequency. Patient denies  fever, abdominal pain, vaginal itching/rash/redness, unusual odor or pain with urination. Patient states this is her first time experiencing these sx. Please advise.    Informed patient I would relay information to Tammy Larson, Tammy C, MD

## 2024-07-01 ENCOUNTER — Ambulatory Visit: Admitting: Internal Medicine

## 2024-07-01 ENCOUNTER — Other Ambulatory Visit: Payer: Self-pay | Admitting: Family Medicine

## 2024-07-01 DIAGNOSIS — N951 Menopausal and female climacteric states: Secondary | ICD-10-CM

## 2024-07-02 ENCOUNTER — Other Ambulatory Visit: Payer: Self-pay | Admitting: Family Medicine

## 2024-07-02 DIAGNOSIS — I10 Essential (primary) hypertension: Secondary | ICD-10-CM

## 2024-07-03 ENCOUNTER — Other Ambulatory Visit: Payer: Self-pay | Admitting: Family Medicine

## 2024-07-18 ENCOUNTER — Ambulatory Visit: Admitting: Family Medicine

## 2024-07-18 ENCOUNTER — Other Ambulatory Visit (HOSPITAL_COMMUNITY): Payer: Self-pay

## 2024-07-18 ENCOUNTER — Telehealth: Payer: Self-pay

## 2024-07-18 ENCOUNTER — Encounter: Payer: Self-pay | Admitting: Family Medicine

## 2024-07-18 VITALS — BP 158/71 | HR 73 | Ht 60.0 in | Wt 167.0 lb

## 2024-07-18 DIAGNOSIS — E119 Type 2 diabetes mellitus without complications: Secondary | ICD-10-CM | POA: Diagnosis not present

## 2024-07-18 DIAGNOSIS — Z23 Encounter for immunization: Secondary | ICD-10-CM

## 2024-07-18 DIAGNOSIS — E11319 Type 2 diabetes mellitus with unspecified diabetic retinopathy without macular edema: Secondary | ICD-10-CM | POA: Diagnosis not present

## 2024-07-18 DIAGNOSIS — F17209 Nicotine dependence, unspecified, with unspecified nicotine-induced disorders: Secondary | ICD-10-CM

## 2024-07-18 DIAGNOSIS — I10 Essential (primary) hypertension: Secondary | ICD-10-CM

## 2024-07-18 DIAGNOSIS — F418 Other specified anxiety disorders: Secondary | ICD-10-CM

## 2024-07-18 DIAGNOSIS — Z794 Long term (current) use of insulin: Secondary | ICD-10-CM | POA: Diagnosis not present

## 2024-07-18 LAB — POCT GLYCOSYLATED HEMOGLOBIN (HGB A1C): Hemoglobin A1C: 7.4 % — AB (ref 4.0–5.6)

## 2024-07-18 LAB — POCT UA - MICROALBUMIN
Creatinine, POC: 100 mg/dL
Microalbumin Ur, POC: 80 mg/L

## 2024-07-18 MED ORDER — TIRZEPATIDE 5 MG/0.5ML ~~LOC~~ SOAJ: 5.0000 mg | SUBCUTANEOUS | 0 refills | Status: AC

## 2024-07-18 MED ORDER — MOUNJARO 7.5 MG/0.5ML ~~LOC~~ SOAJ: SUBCUTANEOUS | 0 refills | Status: AC

## 2024-07-18 NOTE — Assessment & Plan Note (Signed)
 Doing well now that she is taking fluoxetine  consistently.  May use clonazepam  sparingly prn.  Continue regular visits with therapist.

## 2024-07-18 NOTE — Telephone Encounter (Signed)
 Pharmacy Patient Advocate Encounter  Received notification from [facility] that Prior Authorization for Mounjaro  7.5MG /0.5ML auto-injectors  has been CANCELLED due to: Your PA has been resolved, no additional PA is required.     PA #/Case ID/Reference #: [id number]

## 2024-07-18 NOTE — Telephone Encounter (Signed)
 Pharmacy Patient Advocate Encounter   Received notification from [employer]base that prior authorization for Mounjaro  7.5MG /0.5ML auto-injectors is required/requested.   Insurance verification completed.   The patient is insured through [facility].   Per test claim: PA required; PA started via CoverMyMeds. KEY [id number] . Waiting for clinical questions to populate.

## 2024-07-18 NOTE — Progress Notes (Signed)
 " [name] Hurley - 53 y.o. female MRN [id number]  Date of birth: 04-Nov-1970  Subjective Chief Complaint  Patient presents with   Medical Management of Chronic Issues    DM -  Last A1c - 02/22/24 - 5.9    HPI [name] Hurley is a 53 y.o. female here today for follow up visit.   She reports that she is doing ok..   She remains on Tresiba  20 units and metformin .  She stopped Mounjaro  previously, but thinks she may need to restart. A1c today is increased to 7.4%.  She is tolerating medications well at current strength.  Denies hypoglycemia.    BP is elevated.  She has not taken medications yet this morning.  Tolerating medication well.  She does continue to smoke.  Denies chest pain, shortness of breath, palpitations, headache or vision changes.    Mood is ok.  Still with some anxiety.  Continues on fluoxetine  and is seeing a therapist.   ROS:  A comprehensive ROS was completed and negative except as noted per HPI  Allergies[1]  Past Medical History:  Diagnosis Date   Diabetes mellitus    Dyspareunia    Elevated serum creatinine    Hypertension    Sciatica     Past Surgical History:  Procedure Laterality Date   CESAREAN SECTION     paragard      removal 7-16 & reinsertion 02-13-15    Social History   Socioeconomic History   Marital status: Married    Spouse name: Not on file   Number of children: 4   Years of education: 14   Highest education level: Associate degree: occupational, scientist, product/process development, or vocational program  Occupational History   Occupation: Nutritition   Tobacco Use   Smoking status: Every Day    Current packs/day: 1.00    Average packs/day: 1 pack/day for 22.0 years (22.0 ttl pk-yrs)    Types: Cigarettes   Smokeless tobacco: Never  Substance and Sexual Activity   Alcohol use: Not Currently   Drug use: No   Sexual activity: Yes    Partners: Male    Birth control/protection: None  Other Topics Concern   Not on file  Social History Narrative   Born and  raised in [place]. Fun: Sleep   Denies any religious beliefs effecting health care.    Social Drivers of Health   Tobacco Use: High Risk (07/18/2024)   Patient History    Smoking Tobacco Use: Every Day    Smokeless Tobacco Use: Never    Passive Exposure: Not on file  Financial Resource Strain: High Risk (01/11/2024)   Overall Financial Resource Strain (CARDIA)    Difficulty of Paying Living Expenses: Very hard  Food Insecurity: Food Insecurity Present (01/11/2024)   Epic    Worried About Programme Researcher, Broadcasting/film/video in the Last Year: Sometimes true    Ran Out of Food in the Last Year: Sometimes true  Transportation Needs: No Transportation Needs (01/11/2024)   Epic    Lack of Transportation (Medical): No    Lack of Transportation (Non-Medical): No  Physical Activity: Inactive (01/11/2024)   Exercise Vital Sign    Days of Exercise per Week: 0 days    Minutes of Exercise per Session: Not on file  Stress: Stress Concern Present (01/11/2024)   [employer] of Occupational Health - Occupational Stress Questionnaire    Feeling of Stress: To some extent  Social Connections: Moderately Isolated (01/11/2024)   Social Connection and Isolation Panel    Frequency of  Communication with Friends and Family: More than three times a week    Frequency of Social Gatherings with Friends and Family: Never    Attends Religious Services: Never    Database Administrator or Organizations: No    Attends Engineer, Structural: Not on file    Marital Status: Married  Depression (PHQ2-9): Low Risk (07/18/2024)   Depression (PHQ2-9)    PHQ-2 Score: 2  Alcohol Screen: Low Risk (01/11/2024)   Alcohol Screen    Last Alcohol Screening Score (AUDIT): 3  Housing: Low Risk (01/11/2024)   Epic    Unable to Pay for Housing in the Last Year: No    Number of Times Moved in the Last Year: 0    Homeless in the Last Year: No  Utilities: At Risk (11/05/2023)   AHC Utilities    Threatened with loss of utilities: Yes   Health Literacy: Not on file    Family History  Problem Relation Age of Onset   Hypertension Mother    Hyperlipidemia Mother    Colon cancer Mother    Hypertension Father    COPD Father    Emphysema Father    Diabetes Maternal Aunt    COPD Paternal Grandfather    Hypertension Brother    Heart disease Neg Hx    Breast cancer Neg Hx    BRCA 1/2 Neg Hx     Health Maintenance  Topic Date Due   Hepatitis B Vaccines 19-59 Average Risk (1 of 3 - 19+ 3-dose series) Never done   OPHTHALMOLOGY EXAM  10/23/2022   Fecal DNA (Cologuard)  03/08/2023   FOOT EXAM  09/13/2024   Lung Cancer Screening  09/21/2024   Diabetic kidney evaluation - eGFR measurement  11/04/2024   HEMOGLOBIN A1C  01/16/2025   Cervical Cancer Screening (HPV/Pap Cotest)  04/11/2025   Diabetic kidney evaluation - Urine ACR  07/18/2025   Mammogram  11/23/2025   DTaP/Tdap/Td (2 - Td or Tdap) 02/19/2030   Pneumococcal Vaccine: 50+ Years  Completed   Influenza Vaccine  Completed   COVID-19 Vaccine  Completed   Hepatitis C Screening  Completed   HIV Screening  Completed   Zoster Vaccines- Shingrix  Completed   HPV VACCINES  Aged Out   Meningococcal B Vaccine  Aged Out     ----------------------------------------------------------------------------------------------------------------------------------------------------------------------------------------------------------------- Physical Exam BP (!) 158/71 (BP Location: Left Arm, Patient Position: Sitting, Cuff Size: Normal)   Pulse 73   Ht 5' (1.524 m)   Wt 167 lb (75.8 kg)   LMP 08/29/2019   SpO2 98%   BMI 32.61 kg/m   Physical Exam Constitutional:      Appearance: Normal appearance.  Eyes:     General: No scleral icterus. Cardiovascular:     Rate and Rhythm: Normal rate and regular rhythm.  Pulmonary:     Effort: Pulmonary effort is normal.     Breath sounds: Normal breath sounds.  Neurological:     Mental Status: She is alert.  Psychiatric:         Mood and Affect: Mood normal.        Behavior: Behavior normal.     ------------------------------------------------------------------------------------------------------------------------------------------------------------------------------------------------------------------- Assessment and Plan  Type 2 diabetes mellitus with retinopathy of both eyes (HCC) Control of diabetes has worsened.  Restart mounjaro  5mg  x1 month then increase to 7.5mg .  Follow a low carbohydrate diet.  Continue Tresiba  and metformin .   Essential hypertension BP elevated today. Has not taken medications yet.  Return in 2 weeks.  Continue amlodipine  and losartan .   Anxiety with depression Doing well now that she is taking fluoxetine  consistently.  May use clonazepam  sparingly prn.  Continue regular visits with therapist.    Meds ordered this encounter  Medications   tirzepatide  (MOUNJARO ) 5 MG/0.5ML Pen    Sig: Inject 5 mg into the skin once a week.    Dispense:  2 mL    Refill:  0   tirzepatide  (MOUNJARO ) 7.5 MG/0.5ML Pen    Sig: INJECT 7.5 MG SUBCUTANEOUSLY  ONCE A WEEK    Dispense:  6 mL    Refill:  0    Return in about 6 months (around 01/16/2025) for Hypertension, Type 2 Diabetes.        [1] No Known Allergies  "

## 2024-07-18 NOTE — Assessment & Plan Note (Signed)
 Control of diabetes has worsened.  Restart mounjaro  5mg  x1 month then increase to 7.5mg .  Follow a low carbohydrate diet.  Continue Tresiba  and metformin .

## 2024-07-18 NOTE — Assessment & Plan Note (Signed)
 BP elevated today. Has not taken medications yet.  Return in 2 weeks.  Continue amlodipine  and losartan .

## 2024-07-19 ENCOUNTER — Other Ambulatory Visit (HOSPITAL_COMMUNITY): Payer: Self-pay

## 2024-08-10 ENCOUNTER — Other Ambulatory Visit: Payer: Self-pay | Admitting: Family Medicine

## 2024-08-10 DIAGNOSIS — N951 Menopausal and female climacteric states: Secondary | ICD-10-CM

## 2024-08-11 MED ORDER — ESTRADIOL 0.0375 MG/24HR TD PTTW: 1.0000 | MEDICATED_PATCH | TRANSDERMAL | 1 refills | Status: AC

## 2024-08-12 ENCOUNTER — Other Ambulatory Visit: Payer: Self-pay | Admitting: Family Medicine

## 2024-08-12 NOTE — Progress Notes (Deleted)
" ° °  Subjective:    Patient ID: [name] Hurley, female    DOB: 08-24-1970, 54 y.o.   MRN: [id number]  HPI  Patient is here for a 3 week BP check. Patient currently takes amlodipine  10 mg, losartan  100 mg,  Denies chest pain, shortness of breath, palpitations, headache or vision changes.    Review of Systems     Objective:   Physical Exam        Assessment & Plan:   Patients first BP reading isThis encounter was created in error - please disregard. "

## 2024-08-15 ENCOUNTER — Ambulatory Visit

## 2024-08-15 DIAGNOSIS — I10 Essential (primary) hypertension: Secondary | ICD-10-CM

## 2024-08-16 NOTE — Progress Notes (Unsigned)
 error

## 2025-01-16 ENCOUNTER — Ambulatory Visit: Admitting: Family Medicine
